# Patient Record
Sex: Female | Born: 1990 | Race: White | Hispanic: No | Marital: Married | Smoking: Current every day smoker
Health system: Southern US, Community
[De-identification: ages and names within clinical notes are randomized; demographics above are authoritative.]

## PROBLEM LIST (undated history)

## (undated) DIAGNOSIS — N83209 Unspecified ovarian cyst, unspecified side: Secondary | ICD-10-CM

## (undated) DIAGNOSIS — H539 Unspecified visual disturbance: Secondary | ICD-10-CM

## (undated) DIAGNOSIS — E039 Hypothyroidism, unspecified: Secondary | ICD-10-CM

## (undated) DIAGNOSIS — F329 Major depressive disorder, single episode, unspecified: Secondary | ICD-10-CM

## (undated) DIAGNOSIS — G35 Multiple sclerosis: Secondary | ICD-10-CM

## (undated) DIAGNOSIS — K219 Gastro-esophageal reflux disease without esophagitis: Secondary | ICD-10-CM

## (undated) DIAGNOSIS — F32A Depression, unspecified: Secondary | ICD-10-CM

## (undated) DIAGNOSIS — E079 Disorder of thyroid, unspecified: Secondary | ICD-10-CM

## (undated) DIAGNOSIS — E282 Polycystic ovarian syndrome: Secondary | ICD-10-CM

## (undated) HISTORY — DX: Unspecified visual disturbance: H53.9

---
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
## 2009-07-23 ENCOUNTER — Emergency Department (HOSPITAL_COMMUNITY): Admission: EM | Admit: 2009-07-23 | Discharge: 2009-07-23 | Payer: Self-pay | Admitting: Emergency Medicine

## 2009-07-24 ENCOUNTER — Inpatient Hospital Stay (HOSPITAL_COMMUNITY): Admission: AD | Admit: 2009-07-24 | Discharge: 2009-07-25 | Payer: Self-pay | Admitting: Obstetrics and Gynecology

## 2009-08-09 ENCOUNTER — Inpatient Hospital Stay (HOSPITAL_COMMUNITY): Admission: AD | Admit: 2009-08-09 | Discharge: 2009-08-09 | Payer: Self-pay | Admitting: Obstetrics and Gynecology

## 2009-08-13 ENCOUNTER — Ambulatory Visit: Payer: Self-pay | Admitting: Pulmonary Disease

## 2009-08-13 DIAGNOSIS — R0989 Other specified symptoms and signs involving the circulatory and respiratory systems: Secondary | ICD-10-CM

## 2009-08-13 DIAGNOSIS — R0609 Other forms of dyspnea: Secondary | ICD-10-CM | POA: Insufficient documentation

## 2009-08-13 DIAGNOSIS — E063 Autoimmune thyroiditis: Secondary | ICD-10-CM | POA: Insufficient documentation

## 2009-08-28 ENCOUNTER — Ambulatory Visit: Payer: Self-pay | Admitting: Pulmonary Disease

## 2009-10-08 ENCOUNTER — Inpatient Hospital Stay (HOSPITAL_COMMUNITY): Admission: AD | Admit: 2009-10-08 | Discharge: 2009-10-08 | Payer: Self-pay | Admitting: Obstetrics and Gynecology

## 2009-10-21 ENCOUNTER — Inpatient Hospital Stay (HOSPITAL_COMMUNITY): Admission: AD | Admit: 2009-10-21 | Discharge: 2009-10-21 | Payer: Self-pay | Admitting: Obstetrics and Gynecology

## 2009-10-21 ENCOUNTER — Ambulatory Visit: Payer: Self-pay | Admitting: Nurse Practitioner

## 2009-11-18 ENCOUNTER — Inpatient Hospital Stay (HOSPITAL_COMMUNITY): Admission: AD | Admit: 2009-11-18 | Discharge: 2009-11-18 | Payer: Self-pay | Admitting: Obstetrics and Gynecology

## 2009-11-18 ENCOUNTER — Inpatient Hospital Stay (HOSPITAL_COMMUNITY): Admission: AD | Admit: 2009-11-18 | Discharge: 2009-11-21 | Payer: Self-pay | Admitting: Obstetrics and Gynecology

## 2010-04-21 ENCOUNTER — Encounter: Payer: Self-pay | Admitting: "Endocrinology

## 2010-05-02 NOTE — Assessment & Plan Note (Signed)
Summary: rov for dyspnea   Copy to:  Jody Bovard  Primary Provider/Referring Provider:  None  CC:  Pt is here for a 2 week f/u appt.   Pt is currently [redacted] weeks pregnant.  Pt states she stopped taking Advair.   Pt states increased sob with exertion and at rest.  Pt also c/o nasal congestion and non-productive cough..  History of Present Illness: the patient comes in today for followup of her dyspnea. At the last visit, I felt it was unlikely that she had asthma, and asked her to discontinue her Advair and to followup for office spirometry. She comes in today where she is [redacted] weeks pregnant, and continues to have dyspnea with exertion and to some degree at rest. She has not had to use her rescue inhaler. She has a lot of nasal congestion and also cough secondary to postnasal drip. She denies any chest pain or lower extremity pain.  Current Medications (verified): 1)  Ventolin Hfa 108 (90 Base) Mcg/act  Aers (Albuterol Sulfate) .Marland Kitchen.. 1-2 Puffs Every 4-6 Hours As Needed 2)  Metoprolol Succinate 25 Mg Xr24h-Tab (Metoprolol Succinate) .... Take 1 Tablet By Mouth Once A Day 3)  Propylthiouracil 50 Mg Tabs (Propylthiouracil) .... Take 1 Tablet By Mouth Two Times A Day  Allergies (verified): No Known Drug Allergies  Review of Systems       The patient complains of shortness of breath with activity, shortness of breath at rest, non-productive cough, nasal congestion/difficulty breathing through nose, and sneezing.  The patient denies productive cough, coughing up blood, chest pain, irregular heartbeats, acid heartburn, indigestion, loss of appetite, weight change, abdominal pain, difficulty swallowing, sore throat, tooth/dental problems, headaches, itching, ear ache, anxiety, depression, hand/feet swelling, joint stiffness or pain, rash, change in color of mucus, and fever.    Vital Signs:  Patient profile:   20 year old female Height:      67 inches Weight:      223 pounds BMI:     35.05 O2 Sat:       96 % on Room air Temp:     97.6 degrees F oral Pulse rate:   114 / minute BP sitting:   126 / 80  (left arm) Cuff size:   regular  Vitals Entered By: Arman Filter LPN (Aug 28, 2009 10:35 AM)  O2 Flow:  Room air CC: Pt is here for a 2 week f/u appt.   Pt is currently [redacted] weeks pregnant.  Pt states she stopped taking Advair.   Pt states increased sob with exertion and at rest.  Pt also c/o nasal congestion and non-productive cough. Comments Medications reviewed with patient Arman Filter LPN  Aug 28, 2009 10:35 AM    Physical Exam  General:  pregnant female in nad Nose:  no purulence or drainage noted Lungs:  clear to auscultation, no wheezing or rhonchi Heart:  rrr Extremities:  minimal edema, no cyanosis Neurologic:  alert and oriented, moves all 4.   Impression & Recommendations:  Problem # 1:  DYSPNEA ON EXERTION (ICD-786.09)  the pt's spirometry today shows no airflow obstruction.  I suspect that her doe is related to abdominal encroachment on her diaphragm and weight gain.  Her cough really sounds more upper than anything else, and she is clearly having significant postnasal drip today.  I have asked her to discuss with her obstetrician what she is allowed to take for this.  I would like her to stay off advair, but can use albuterol  for emergencies only.  Again, I think she does not have asthma, but would like to see her back after delivery to make sure that she has returned to a normal baseline.  Other Orders: Est. Patient Level IV (16109) Spirometry w/Graph (60454)  Patient Instructions: 1)  would stay off advair, but can use albuterol for rescue if you really feel like you are having a hard time breathing. 2)  discuss with your obstetrician what you can take for postnasal drip, nasal congestion, and reflux.  3)  followup with me after your delivery.   [facility] Spirometry  ID: [id number] Patient: Taylor Donovan, Taylor Donovan DOB: [birth year] Age: 20 Years Old Sex:  Female Race: White Physician: Minette Manders Height: 67 Weight: 223 Status: Unconfirmed Past Medical History:   HASHIMOTO'S THYROIDITIS (ICD-245.2)   Recorded: 08/28/2009 11:07 AM  Parameter  Measured Predicted %Predicted FVC     3.90        4.14        94.10 FEV1     2.95        3.62        81.60 FEV1%   75.72        86.77        87.30 PEF    5.28        7.25        72.80   Interpretation: the pt has no airflow obstruction by FEV1%.  She had some coughing during testing, but had one attempt with a reasonable flow volume loop and expiratory time of greater than 6sec.

## 2010-05-02 NOTE — Assessment & Plan Note (Signed)
Summary: consult for dyspnea   Copy to:  Jody Bovard  Primary Provider/Referring Provider:  None  CC:  Pulmonary Consult.  History of Present Illness: the patient is a 20 year old female who I have been asked to see for possible asthma. She is [redacted] weeks pregnant, and one month ago began to experience difficulties taking a deep breath. She also had chest tightness, and worsening shortness of breath whenever she laid back in her bed. She also has dyspnea on exertion that she thinks is related to her pregnancy, and only minimal shortness of breath at rest. She denies a history of childhood asthma, and has only had a minimal cough that's dry in nature. She was felt to possibly have asthma, and was therefore tried on Advair. She has not seen any difference with this medication. She denies any significant lower extremity edema, and has had no pleuritic chest pain. She does have a history of thyroid disease, but tells me that her levels have been in a fairly normal range. She does not think that she has anemia currently. She has a history of smoking since age 89, but has not smoked since last month.  Preventive Screening-Counseling & Management  Alcohol-Tobacco     Smoking Status: quit  Current Medications (verified): 1)  Ventolin Hfa 108 (90 Base) Mcg/act  Aers (Albuterol Sulfate) .Marland Kitchen.. 1-2 Puffs Every 4-6 Hours As Needed 2)  Metoprolol Succinate 25 Mg Xr24h-Tab (Metoprolol Succinate) .... Take 1 Tablet By Mouth Once A Day 3)  Propylthiouracil 50 Mg Tabs (Propylthiouracil) .... Take 1 Tablet By Mouth Two Times A Day  Allergies (verified): No Known Drug Allergies  Past History:  Past Medical History:  HASHIMOTO'S THYROIDITIS (ICD-245.2)    Past Surgical History: none per pt.  Family History: Reviewed history and no changes required. cancer: mother (unsure what kind)   Social History: Reviewed history and no changes required. Patient states former smoker.   started at age 53.  1/2 ppd.   quit April 2011. pt is single. pt does not have any children.  Currently [redacted] weeks pregnant.  Due November 18, 2009 pt is single. pt lives with mother and father. pt works as a Publishing copy for [employer] Smoking Status:  quit  Review of Systems       The patient complains of shortness of breath with activity, shortness of breath at rest, non-productive cough, chest pain, irregular heartbeats, acid heartburn, headaches, sneezing, and hand/feet swelling.  The patient denies productive cough, coughing up blood, indigestion, loss of appetite, weight change, abdominal pain, difficulty swallowing, sore throat, tooth/dental problems, nasal congestion/difficulty breathing through nose, itching, ear ache, anxiety, depression, joint stiffness or pain, rash, change in color of mucus, and fever.    Vital Signs:  Patient profile:   20 year old female Height:      67 inches Weight:      221 pounds BMI:     34.74 O2 Sat:      96 % on Room air Temp:     98.0 degrees F oral Pulse rate:   99 / minute BP sitting:   118 / 68  (right arm) Cuff size:   regular  Vitals Entered By: Arman Filter LPN (Aug 13, 2009 3:03 PM)  O2 Flow:  Room air CC: Pulmonary Consult Comments Medications reviewed with patient Arman Filter LPN  Aug 13, 2009 3:03 PM    Physical Exam  General:  ow female in nad Eyes:  PERRLA and EOMI.   Nose:  patent  without discharge Mouth:  clear  Neck:  prominent thyroid no jvd, tmg, LN Lungs:  totally clear to auscultation Heart:  rrr, no mrg Abdomen:  soft, nontender, bs+ Extremities:  no edema noted, no cyanosis Neurologic:  alert and oriented, moves all 4.   Impression & Recommendations:  Problem # 1:  DYSPNEA ON EXERTION (ICD-786.09) the patient has dyspnea on exertion but I suspect is multifactorial. She is obese and [redacted] weeks pregnant, and her history is much more consistent with abdominal encroachment on her diaphragm excursion much more so than asthma. She also has  significant reflux symptoms to explain her chest tightness. She has been tried on Advair without significant change. She has no history to suggest thromboembolic disease. My only other question is whether anemia of pregnancy or possibly thyroid issues could be contributing to some of this. At this point, I have asked the patient to discontinue her Advair, and we'll see her back in a few weeks for pulmonary function testing. She can use her albuterol inhaler in the interim for rescue.  Medications Added to Medication List This Visit: 1)  Ventolin Hfa 108 (90 Base) Mcg/act Aers (Albuterol sulfate) .Marland Kitchen.. 1-2 puffs every 4-6 hours as needed 2)  Metoprolol Succinate 25 Mg Xr24h-tab (Metoprolol succinate) .... Take 1 tablet by mouth once a day 3)  Propylthiouracil 50 Mg Tabs (Propylthiouracil) .... Take 1 tablet by mouth two times a day  Other Orders: Consultation Level IV (04540)  Patient Instructions: 1)  stay off advair, but can use albuterol every 4-6 hrs if needed for emergencies. 2)  try sleeping more upright if you need to 3)  follow up with me in 2 weeks after advair has gotten out of your system, and we can check breathing tests.

## 2010-06-14 LAB — CBC
HCT: 33.8 % — ABNORMAL LOW (ref 36.0–46.0)
HCT: 39.4 % (ref 36.0–46.0)
Hemoglobin: 11.8 g/dL — ABNORMAL LOW (ref 12.0–15.0)
MCH: 29.5 pg (ref 26.0–34.0)
Platelets: 319 10*3/uL (ref 150–400)
RBC: 3.89 MIL/uL (ref 3.87–5.11)
RBC: 4.62 MIL/uL (ref 3.87–5.11)
RDW: 13.6 % (ref 11.5–15.5)

## 2010-06-14 LAB — RPR: RPR Ser Ql: NONREACTIVE

## 2010-06-16 LAB — URINALYSIS, ROUTINE W REFLEX MICROSCOPIC
Bilirubin Urine: NEGATIVE
Hgb urine dipstick: NEGATIVE
Protein, ur: NEGATIVE mg/dL
Specific Gravity, Urine: 1.015 (ref 1.005–1.030)
Urobilinogen, UA: 0.2 mg/dL (ref 0.0–1.0)

## 2010-06-18 LAB — URINALYSIS, ROUTINE W REFLEX MICROSCOPIC
Bilirubin Urine: NEGATIVE
Hgb urine dipstick: NEGATIVE
Ketones, ur: NEGATIVE mg/dL
Nitrite: NEGATIVE
Specific Gravity, Urine: <1.005 — ABNORMAL LOW (ref 1.005–1.030)

## 2010-06-18 LAB — D-DIMER, QUANTITATIVE: D-Dimer, Quant: 0.4 ug/mL-FEU (ref 0.00–0.48)

## 2011-01-03 LAB — CBC
MCHC: 35.2
MCV: 81.4
RDW: 12
WBC: 7

## 2011-01-03 LAB — PROTIME-INR: INR: 0.9

## 2011-01-03 LAB — APTT: aPTT: 29

## 2011-01-08 ENCOUNTER — Emergency Department (HOSPITAL_BASED_OUTPATIENT_CLINIC_OR_DEPARTMENT_OTHER)
Admission: EM | Admit: 2011-01-08 | Discharge: 2011-01-08 | Disposition: A | Discharge status: Home / Self Care | Payer: Self-pay | Attending: Emergency Medicine | Admitting: Emergency Medicine

## 2011-01-08 ENCOUNTER — Emergency Department (INDEPENDENT_AMBULATORY_CARE_PROVIDER_SITE_OTHER): Payer: Self-pay

## 2011-01-08 ENCOUNTER — Encounter: Payer: Self-pay | Admitting: *Deleted

## 2011-01-08 DIAGNOSIS — Z975 Presence of (intrauterine) contraceptive device: Secondary | ICD-10-CM

## 2011-01-08 DIAGNOSIS — R1032 Left lower quadrant pain: Secondary | ICD-10-CM | POA: Insufficient documentation

## 2011-01-08 DIAGNOSIS — R109 Unspecified abdominal pain: Secondary | ICD-10-CM

## 2011-01-08 DIAGNOSIS — F17200 Nicotine dependence, unspecified, uncomplicated: Secondary | ICD-10-CM | POA: Insufficient documentation

## 2011-01-08 HISTORY — DX: Disorder of thyroid, unspecified: E07.9

## 2011-01-08 LAB — COMPREHENSIVE METABOLIC PANEL
AST: 18 U/L (ref 0–37)
Albumin: 4.1 g/dL (ref 3.5–5.2)
Alkaline Phosphatase: 115 U/L (ref 39–117)
CO2: 25 mEq/L (ref 19–32)
Chloride: 102 mEq/L (ref 96–112)
Creatinine, Ser: 0.6 mg/dL (ref 0.50–1.10)
GFR calc non Af Amer: >90 mL/min (ref >90)
Potassium: 4.2 mEq/L (ref 3.5–5.1)
Total Bilirubin: 0.2 mg/dL — ABNORMAL LOW (ref 0.3–1.2)

## 2011-01-08 LAB — CBC
HCT: 38.9 % (ref 36.0–46.0)
MCHC: 35 g/dL (ref 30.0–36.0)
MCV: 80.9 fL (ref 78.0–100.0)
Platelets: 329 10*3/uL (ref 150–400)
RBC: 4.81 MIL/uL (ref 3.87–5.11)
RDW: 11.9 % (ref 11.5–15.5)

## 2011-01-08 LAB — PREGNANCY, URINE: Preg Test, Ur: NEGATIVE

## 2011-01-08 LAB — URINALYSIS, ROUTINE W REFLEX MICROSCOPIC
Ketones, ur: NEGATIVE mg/dL
Urobilinogen, UA: 0.2 mg/dL (ref 0.0–1.0)
pH: 6 (ref 5.0–8.0)

## 2011-01-08 LAB — WET PREP, GENITAL

## 2011-01-08 MED ORDER — SODIUM CHLORIDE 0.9 % IV BOLUS (SEPSIS)
1000.0000 mL | Freq: Once | INTRAVENOUS | Status: AC
  Administered 2011-01-08: 1000 mL via INTRAVENOUS

## 2011-01-08 MED ORDER — IOHEXOL 300 MG/ML  SOLN
100.0000 mL | Freq: Once | INTRAMUSCULAR | Status: AC | PRN
  Administered 2011-01-08: 100 mL via INTRAVENOUS

## 2011-01-08 MED ORDER — PROMETHAZINE HCL 25 MG PO TABS: 25.0000 mg | ORAL_TABLET | Freq: Four times a day (QID) | ORAL | Status: AC | PRN

## 2011-01-08 MED ORDER — HYDROCODONE-ACETAMINOPHEN 5-325 MG PO TABS: 1.0000 | ORAL_TABLET | ORAL | Status: AC | PRN

## 2011-01-08 MED ORDER — MORPHINE SULFATE 4 MG/ML IJ SOLN
6.0000 mg | Freq: Once | INTRAMUSCULAR | Status: AC
  Administered 2011-01-08: 6 mg via INTRAVENOUS
  Filled 2011-01-08: qty 2

## 2011-01-08 NOTE — ED Provider Notes (Signed)
History     CSN: [id number] Arrival date & time: 01/08/2011  7:44 PM  Chief Complaint  Patient presents with  . Abdominal Pain     Patient is a 20 y.o. female presenting with abdominal pain. The history is provided by the patient.  Abdominal Pain The primary symptoms of the illness include abdominal pain, nausea and vaginal discharge. The primary symptoms of the illness do not include fever, shortness of breath, vomiting, diarrhea or vaginal bleeding. Episode onset: 4 days ago. The onset of the illness was gradual. The problem has not changed since onset. The abdominal pain is located in the LLQ and RLQ. The abdominal pain does not radiate. The abdominal pain is relieved by nothing.  The patient states that she believes she is currently not pregnant. The patient has not had a change in bowel habit. Additional symptoms associated with the illness include frequency. Symptoms associated with the illness do not include chills, anorexia, diaphoresis, heartburn, constipation, urgency, hematuria or back pain.  Has been having episodic pain in lower abd since Sunday, located in a band going across her lower abdomen. It is episodic - she has approx 15 minute periods of sharp pain which is accompanied by nausea but no vomiting. No change in bowel movements or dysuria. She has been afebrile at home, and has not noted a change in appetite. She has noted a small amount of white vaginal d/c but no abnormal bleeding. Denies hx of surgeries to the abd.  She reports a history of unilateral ovarian "swelling" or a possible cyst when she was in her teens; she was started on OCPs for this condition. She currently has an IUD in place and is followed regularly by GYN. She does not have a regular period with the IUD in place but has had some spotting for the last few months around the end of the month.  Past Medical History  Diagnosis Date  . Thyroid disease     History reviewed. No pertinent past surgical  history.  No family history on file.  History  Substance Use Topics  . Smoking status: Current Everyday Smoker  . Smokeless tobacco: Not on file  . Alcohol Use: Yes    OB History    Grav Para Term Preterm Abortions TAB SAB Ect Mult Living                  Review of Systems  Constitutional: Negative for fever, chills and diaphoresis.  Respiratory: Negative for shortness of breath.   Gastrointestinal: Positive for nausea and abdominal pain. Negative for heartburn, vomiting, diarrhea, constipation and anorexia.  Genitourinary: Positive for frequency and vaginal discharge. Negative for urgency, hematuria and vaginal bleeding.  Musculoskeletal: Negative for back pain.  All other systems reviewed and are negative.    Allergies  Review of patient's allergies indicates no known allergies.  Home Medications   Current Outpatient Rx  Name Route Sig Dispense Refill  . FLUOXETINE HCL 10 MG PO CAPS Oral Take 30 mg by mouth daily.      Marland Kitchen METOPROLOL SUCCINATE 25 MG PO TB24 Oral Take 25 mg by mouth daily.      Marland Kitchen PROPYLTHIOURACIL 50 MG PO TABS Oral Take 50 mg by mouth daily.      Marland Kitchen LEVONORGESTREL 20 MCG/24HR IU IUD Intrauterine 1 each by Intrauterine route once.        BP 123/65  Pulse 98  Temp(Src) 98.6 F (37 C) (Oral)  Resp 16  Ht 5\' 7"  (1.702 m)  Wt 221 lb (100.245 kg)  BMI 34.61 kg/m2  SpO2 98%  Physical Exam  Constitutional: She is oriented to person, place, and time. She appears well-developed and well-nourished.  HENT:  Head: Normocephalic.  Eyes: EOM are normal.  Neck: Normal range of motion.  Cardiovascular: Normal rate and regular rhythm.   Pulmonary/Chest: Effort normal and breath sounds normal.  Abdominal: Soft. Bowel sounds are normal. She exhibits no distension. There is no hepatosplenomegaly. There is tenderness in the right lower quadrant and left lower quadrant. There is no rigidity, no rebound, no guarding and no CVA tenderness.       Mild to moderate TTP  across lower abd, worse in RLQ. +obturator sign.  Genitourinary: Vagina normal. Cervix exhibits no motion tenderness and no discharge. Right adnexum displays tenderness. Right adnexum displays no mass and no fullness. Left adnexum displays no mass and no fullness.       Chaperone present during exam  Musculoskeletal: Normal range of motion.  Neurological: She is alert and oriented to person, place, and time.  Skin: Skin is warm and dry.    ED Course  Procedures (including critical care time)  Labs Reviewed  URINALYSIS, ROUTINE W REFLEX MICROSCOPIC - Abnormal; Notable for the following:    Appearance CLOUDY (*)    All other components within normal limits  WET PREP, GENITAL - Abnormal; Notable for the following:    Clue Cells, Wet Prep FEW (*)    WBC, Wet Prep HPF POC RARE (*)    All other components within normal limits  CBC - Abnormal; Notable for the following:    WBC 11.7 (*)    All other components within normal limits  COMPREHENSIVE METABOLIC PANEL - Abnormal; Notable for the following:    Total Bilirubin 0.2 (*)    All other components within normal limits  PREGNANCY, URINE  GC/CHLAMYDIA PROBE AMP, GENITAL   Ct Abdomen Pelvis W Contrast  01/08/2011  *RADIOLOGY REPORT*  Clinical Data: Lower abdominal pain  CT ABDOMEN AND PELVIS WITH CONTRAST  Technique:  Multidetector CT imaging of the abdomen and pelvis was performed following the standard protocol during bolus administration of intravenous contrast.  Contrast: OMNIPAQUE IOHEXOL 300 MG/ML IV SOLN  Comparison: None  Findings: Lung bases are clear.  No pleural or pericardial fluid.  The liver is normal without focal lesions or biliary ductal dilatation.  No calcified gallstones.  The spleen is normal.  The pancreas is normal.  The adrenal glands are normal.  The kidneys are normal.  No cyst, mass, stone or hydronephrosis.  The aorta and IVC are normal.  No retroperitoneal mass or adenopathy.  No free intraperitoneal fluid or  air.  No bowel pathology.  The appendix is normal.  IUD is present within the uterus.  Adnexal regions appear unremarkable.  No free fluid in the pelvis.  No significant bony finding.  IMPRESSION: Negative CT scan of the abdomen and pelvis.  No cause of pain identified.  IUD in place.  Original Report Authenticated By: Thomasenia Sales, M.D.     No diagnosis found.    MDM  Intermittent pain mostly in the RLQ x several days, with hx of possible ovarian cyst. Pt does not appear toxic. Labwork remarkable for small white count. CT abd/pelvis does not reveal acute pathology such as cholecystitis or appendicitis. Will send patient home with analgesics, with instruction to f/u with PCP in 2 days if not improved. Discussed reasons to return to the ED sooner for re-eval.  Grant Fontana 01/08/11 2240  Grant Fontana 01/08/11 2241

## 2011-01-08 NOTE — ED Notes (Signed)
Pt returned from CT, requesting something to eat. Pt made aware that she couldn't have anything to eat until CT results were back.

## 2011-01-08 NOTE — ED Notes (Signed)
C/o lower abd pain,nausea, vaginal d/c-denies v/d,urinary s/s

## 2011-01-08 NOTE — ED Provider Notes (Signed)
Medical screening examination/treatment/procedure(s) were conducted as a shared visit with non-physician practitioner(s) and myself.  I personally evaluated the patient during the encounter  Nonspecific right-sided abdominal pain.  Her pain seems to be more tender in the right lower abdomen and therefore CT scan was obtained which demonstrates no evidence of appendicitis or other acute pathology.  My suspicion for cholecystitis very low.  Labs are normal  I personally reviewed the patient's CT scan  Results for orders placed during the hospital encounter of 01/08/11  URINALYSIS, ROUTINE W REFLEX MICROSCOPIC      Component Value Range   Color, Urine YELLOW  YELLOW    Appearance CLOUDY (*) CLEAR    Specific Gravity, Urine 1.009  1.005 - 1.030    pH 6.0  5.0 - 8.0    Glucose, UA NEGATIVE  NEGATIVE (mg/dL)   Hgb urine dipstick NEGATIVE  NEGATIVE    Bilirubin Urine NEGATIVE  NEGATIVE    Ketones, ur NEGATIVE  NEGATIVE (mg/dL)   Protein, ur NEGATIVE  NEGATIVE (mg/dL)   Urobilinogen, UA 0.2  0.0 - 1.0 (mg/dL)   Nitrite NEGATIVE  NEGATIVE    Leukocytes, UA NEGATIVE  NEGATIVE   PREGNANCY, URINE      Component Value Range   Preg Test, Ur NEGATIVE    WET PREP, GENITAL      Component Value Range   Yeast, Wet Prep NONE SEEN  NONE SEEN    Trich, Wet Prep NONE SEEN  NONE SEEN    Clue Cells, Wet Prep FEW (*) NONE SEEN    WBC, Wet Prep HPF POC RARE (*) NONE SEEN   CBC      Component Value Range   WBC 11.7 (*) 4.0 - 10.5 (K/uL)   RBC 4.81  3.87 - 5.11 (MIL/uL)   Hemoglobin 13.6  12.0 - 15.0 (g/dL)   HCT 40.9  81.1 - 91.4 (%)   MCV 80.9  78.0 - 100.0 (fL)   MCH 28.3  26.0 - 34.0 (pg)   MCHC 35.0  30.0 - 36.0 (g/dL)   RDW 78.2  95.6 - 21.3 (%)   Platelets 329  150 - 400 (K/uL)  COMPREHENSIVE METABOLIC PANEL      Component Value Range   Sodium 136  135 - 145 (mEq/L)   Potassium 4.2  3.5 - 5.1 (mEq/L)   Chloride 102  96 - 112 (mEq/L)   CO2 25  19 - 32 (mEq/L)   Glucose, Bld 96  70 - 99  (mg/dL)   BUN 8  6 - 23 (mg/dL)   Creatinine, Ser 0.86  0.50 - 1.10 (mg/dL)   Calcium 9.5  8.4 - 57.8 (mg/dL)   Total Protein 7.7  6.0 - 8.3 (g/dL)   Albumin 4.1  3.5 - 5.2 (g/dL)   AST 18  0 - 37 (U/L)   ALT 28  0 - 35 (U/L)   Alkaline Phosphatase 115  39 - 117 (U/L)   Total Bilirubin 0.2 (*) 0.3 - 1.2 (mg/dL)   GFR calc non Af Amer >90  >90 (mL/min)   GFR calc Af Amer >90  >90 (mL/min)   Ct Abdomen Pelvis W Contrast  01/08/2011  *RADIOLOGY REPORT*  Clinical Data: Lower abdominal pain  CT ABDOMEN AND PELVIS WITH CONTRAST  Technique:  Multidetector CT imaging of the abdomen and pelvis was performed following the standard protocol during bolus administration of intravenous contrast.  Contrast: OMNIPAQUE IOHEXOL 300 MG/ML IV SOLN  Comparison: None  Findings: Lung bases are clear.  No pleural or pericardial fluid.  The liver is normal without focal lesions or biliary ductal dilatation.  No calcified gallstones.  The spleen is normal.  The pancreas is normal.  The adrenal glands are normal.  The kidneys are normal.  No cyst, mass, stone or hydronephrosis.  The aorta and IVC are normal.  No retroperitoneal mass or adenopathy.  No free intraperitoneal fluid or air.  No bowel pathology.  The appendix is normal.  IUD is present within the uterus.  Adnexal regions appear unremarkable.  No free fluid in the pelvis.  No significant bony finding.  IMPRESSION: Negative CT scan of the abdomen and pelvis.  No cause of pain identified.  IUD in place.  Original Report Authenticated By: Thomasenia Sales, M.D.    Lyanne Co, MD 01/08/11 [phone]

## 2011-01-09 LAB — URINALYSIS, ROUTINE W REFLEX MICROSCOPIC
Bilirubin Urine: NEGATIVE
Glucose, UA: NEGATIVE
Ketones, ur: NEGATIVE
Nitrite: POSITIVE — AB
Specific Gravity, Urine: 1.029
pH: 6

## 2011-01-09 LAB — URINE CULTURE

## 2011-01-09 LAB — URINE MICROSCOPIC-ADD ON

## 2011-01-09 LAB — POCT PREGNANCY, URINE: Operator id: 146091

## 2011-01-09 LAB — GC/CHLAMYDIA PROBE AMP, URINE
Chlamydia, Swab/Urine, PCR: NEGATIVE
GC Probe Amp, Urine: NEGATIVE

## 2011-09-17 ENCOUNTER — Encounter (HOSPITAL_BASED_OUTPATIENT_CLINIC_OR_DEPARTMENT_OTHER): Payer: Self-pay | Admitting: *Deleted

## 2011-09-17 ENCOUNTER — Emergency Department (HOSPITAL_BASED_OUTPATIENT_CLINIC_OR_DEPARTMENT_OTHER)
Admission: EM | Admit: 2011-09-17 | Discharge: 2011-09-17 | Disposition: A | Discharge status: Home / Self Care | Payer: Medicaid Other | Attending: Emergency Medicine | Admitting: Emergency Medicine

## 2011-09-17 DIAGNOSIS — F17200 Nicotine dependence, unspecified, uncomplicated: Secondary | ICD-10-CM | POA: Insufficient documentation

## 2011-09-17 DIAGNOSIS — K219 Gastro-esophageal reflux disease without esophagitis: Secondary | ICD-10-CM | POA: Insufficient documentation

## 2011-09-17 DIAGNOSIS — E079 Disorder of thyroid, unspecified: Secondary | ICD-10-CM | POA: Insufficient documentation

## 2011-09-17 DIAGNOSIS — J029 Acute pharyngitis, unspecified: Secondary | ICD-10-CM | POA: Insufficient documentation

## 2011-09-17 HISTORY — DX: Depression, unspecified: F32.A

## 2011-09-17 HISTORY — DX: Major depressive disorder, single episode, unspecified: F32.9

## 2011-09-17 HISTORY — DX: Gastro-esophageal reflux disease without esophagitis: K21.9

## 2011-09-17 LAB — RAPID STREP SCREEN (MED CTR MEBANE ONLY): Streptococcus, Group A Screen (Direct): NEGATIVE

## 2011-09-17 MED ORDER — HYDROCODONE-ACETAMINOPHEN 5-325 MG PO TABS: 2.0000 | ORAL_TABLET | ORAL | Status: AC | PRN

## 2011-09-17 NOTE — ED Provider Notes (Signed)
History     CSN: [id number]  Arrival date & time 09/17/11  0920   First MD Initiated Contact with Patient 09/17/11 [phone]      Chief Complaint  Patient presents with  . Sore Throat    ) HPI Patient states she developed a sore throat and sinus congestion last night with no fever. Last night she took Tylenol sinus last night and this morning at 0630am. Now only had a sore throat.  Past Medical History  Diagnosis Date  . Thyroid disease   . Acid reflux   . Depression     History reviewed. No pertinent past surgical history.  No family history on file.  History  Substance Use Topics  . Smoking status: Current Everyday Smoker -- 0.5 packs/day for 8 years    Types: Cigarettes  . Smokeless tobacco: Not on file  . Alcohol Use: Yes    OB History    Grav Para Term Preterm Abortions TAB SAB Ect Mult Living                  Review of Systems  All other systems reviewed and are negative.    Allergies  Review of patient's allergies indicates no known allergies.  Home Medications   Current Outpatient Rx  Name Route Sig Dispense Refill  . FLUOXETINE HCL 10 MG PO CAPS Oral Take 30 mg by mouth daily.      Marland Kitchen HYDROCODONE-ACETAMINOPHEN 5-325 MG PO TABS Oral Take 2 tablets by mouth every 4 (four) hours as needed for pain. 10 tablet 0  . LEVONORGESTREL 20 MCG/24HR IU IUD Intrauterine 1 each by Intrauterine route once.      Marland Kitchen METOPROLOL SUCCINATE ER 25 MG PO TB24 Oral Take 25 mg by mouth daily.      Marland Kitchen PROPYLTHIOURACIL 50 MG PO TABS Oral Take 50 mg by mouth daily.        BP 152/76  Pulse 100  Temp 98.2 F (36.8 C) (Oral)  Resp 16  Ht 5\' 7"  (1.702 m)  Wt 200 lb (90.719 kg)  BMI 31.32 kg/m2  SpO2 97%  Physical Exam  Nursing note and vitals reviewed. Constitutional: She is oriented to person, place, and time. She appears well-developed and well-nourished. No distress.  HENT:  Head: Normocephalic and atraumatic.  Mouth/Throat: Uvula is midline, oropharynx is clear and  moist and mucous membranes are normal. No oropharyngeal exudate, posterior oropharyngeal edema, posterior oropharyngeal erythema or tonsillar abscesses.  Eyes: Pupils are equal, round, and reactive to light.  Neck: Normal range of motion. No mass and no thyromegaly present.  Cardiovascular: Normal rate and intact distal pulses.   Pulmonary/Chest: No respiratory distress.  Abdominal: Normal appearance. She exhibits no distension.  Musculoskeletal: Normal range of motion.  Lymphadenopathy:    She has no cervical adenopathy.  Neurological: She is alert and oriented to person, place, and time. No cranial nerve deficit.  Skin: Skin is warm and dry. No rash noted.  Psychiatric: She has a normal mood and affect. Her behavior is normal.    ED Course  Procedures (including critical care time)   Labs Reviewed  RAPID STREP SCREEN   No results found.   1. Pharyngitis       MDM         Nelia Shi, MD 09/17/11 1020

## 2011-09-17 NOTE — Discharge Instructions (Signed)
Antibiotic Nonuse  Your caregiver felt that the infection or problem was not one that would be helped with an antibiotic. Infections may be caused by viruses or bacteria. Only a caregiver can tell which one of these is the likely cause of an illness. A cold is the most common cause of infection in both adults and children. A cold is a virus. Antibiotic treatment will have no effect on a viral infection. Viruses can lead to many lost days of work caring for sick children and many missed days of school. Children may catch as many as 10 "colds" or "flus" per year during which they can be tearful, cranky, and uncomfortable. The goal of treating a virus is aimed at keeping the ill person comfortable. Antibiotics are medications used to help the body fight bacterial infections. There are relatively few types of bacteria that cause infections but there are hundreds of viruses. While both viruses and bacteria cause infection they are very different types of germs. A viral infection will typically go away by itself within 7 to 10 days. Bacterial infections may spread or get worse without antibiotic treatment. Examples of bacterial infections are:  Sore throats (like strep throat or tonsillitis).   Infection in the lung (pneumonia).   Ear and skin infections.  Examples of viral infections are:  Colds or flus.   Most coughs and bronchitis.   Sore throats not caused by Strep.   Runny noses.  It is often best not to take an antibiotic when a viral infection is the cause of the problem. Antibiotics can kill off the helpful bacteria that we have inside our body and allow harmful bacteria to start growing. Antibiotics can cause side effects such as allergies, nausea, and diarrhea without helping to improve the symptoms of the viral infection. Additionally, repeated uses of antibiotics can cause bacteria inside of our body to become resistant. That resistance can be passed onto harmful bacterial. The next time  you have an infection it may be harder to treat if antibiotics are used when they are not needed. Not treating with antibiotics allows our own immune system to develop and take care of infections more efficiently. Also, antibiotics will work better for [place] when they are prescribed for bacterial infections. Treatments for a child that is ill may include:  Give extra fluids throughout the day to stay hydrated.   Get plenty of rest.   Only give your child over-the-counter or prescription medicines for pain, discomfort, or fever as directed by your caregiver.   The use of a cool mist humidifier may help stuffy noses.   Cold medications if suggested by your caregiver.  Your caregiver may decide to start you on an antibiotic if:  The problem you were seen for today continues for a longer length of time than expected.   You develop a secondary bacterial infection.  SEEK MEDICAL CARE IF:  Fever lasts longer than 5 days.   Symptoms continue to get worse after 5 to 7 days or become severe.   Difficulty in breathing develops.   Signs of dehydration develop (poor drinking, rare urinating, dark colored urine).   Changes in behavior or worsening tiredness (listlessness or lethargy).  Document Released: 05/26/2001 Document Revised: 03/06/2011 Document Reviewed: 11/22/2008 [facility] Patient Information 2012 [place], [place].   Salt Water Gargle This solution will help make your mouth and throat feel better. HOME CARE INSTRUCTIONS  Mix 1 teaspoon of salt in 8 ounces of warm water.  Gargle with this solution as  much or often as you need or as directed. Swish and gargle gently if you have any sores or wounds in your mouth.  Do not swallow this mixture.  Document Released: 12/20/2003 Document Revised: 03/06/2011 Document Reviewed: 05/12/2008 [facility] Patient Information 2012 [place], [place]    .Pharyngitis, Viral and Bacterial Pharyngitis is soreness (inflammation) or infection of the pharynx.  It is also called a sore throat. CAUSES  Most sore throats are caused by viruses and are part of a cold. However, some sore throats are caused by strep and other bacteria. Sore throats can also be caused by post nasal drip from draining sinuses, allergies and sometimes from sleeping with an open mouth. Infectious sore throats can be spread from person to person by coughing, sneezing and sharing cups or eating utensils. TREATMENT  Sore throats that are viral usually last 3-4 days. Viral illness will get better without medications (antibiotics). Strep throat and other bacterial infections will usually begin to get better about 24-48 hours after you begin to take antibiotics. HOME CARE INSTRUCTIONS   If the caregiver feels there is a bacterial infection or if there is a positive strep test, they will prescribe an antibiotic. The full course of antibiotics must be taken. If the full course of antibiotic is not taken, you or your child may become ill again. If you or your child has strep throat and do not finish all of the medication, serious heart or kidney diseases may develop.   Drink enough water and fluids to keep your urine clear or pale yellow.   Only take over-the-counter or prescription medicines for pain, discomfort or fever as directed by your caregiver.   Get lots of rest.   Gargle with salt water ( tsp. of salt in a glass of water) as often as every 1-2 hours as you need for comfort.   Hard candies may soothe the throat if individual is not at risk for choking. Throat sprays or lozenges may also be used.  SEEK MEDICAL CARE IF:   Large, tender lumps in the neck develop.   A rash develops.   Green, yellow-brown or bloody sputum is coughed up.   Your baby is older than 3 months with a rectal temperature of 100.5 F (38.1 C) or higher for more than 1 day.  SEEK IMMEDIATE MEDICAL CARE IF:   A stiff neck develops.   You or your child are drooling or unable to swallow liquids.    You or your child are vomiting, unable to keep medications or liquids down.   You or your child has severe pain, unrelieved with recommended medications.   You or your child are having difficulty breathing (not due to stuffy nose).   You or your child are unable to fully open your mouth.   You or your child develop redness, swelling, or severe pain anywhere on the neck.   You have a fever.   Your baby is older than 3 months with a rectal temperature of 102 F (38.9 C) or higher.   Your baby is 79 months old or younger with a rectal temperature of 100.4 F (38 C) or higher.  MAKE SURE YOU:   Understand these instructions.   Will watch your condition.   Will get help right away if you are not doing well or get worse.  Document Released: 03/17/2005 Document Revised: 03/06/2011 Document Reviewed: 06/14/2007 [facility] Patient Information 2012 [place], [place].

## 2011-09-17 NOTE — ED Notes (Signed)
Patient states she developed a sore throat and sinus congestion last night with no fever.  Last night she took Tylenol sinus last night and this morning at 0630am.  Now only had a sore throat.

## 2011-11-03 ENCOUNTER — Other Ambulatory Visit (HOSPITAL_COMMUNITY): Payer: Self-pay | Admitting: Endocrinology

## 2011-11-03 DIAGNOSIS — E0590 Thyrotoxicosis, unspecified without thyrotoxic crisis or storm: Secondary | ICD-10-CM

## 2011-11-10 ENCOUNTER — Encounter (HOSPITAL_COMMUNITY)
Admission: RE | Admit: 2011-11-10 | Discharge: 2011-11-10 | Disposition: A | Discharge status: Home / Self Care | Payer: Medicaid Other | Source: Ambulatory Visit | Attending: Endocrinology | Admitting: Endocrinology

## 2011-11-10 DIAGNOSIS — E0590 Thyrotoxicosis, unspecified without thyrotoxic crisis or storm: Secondary | ICD-10-CM | POA: Insufficient documentation

## 2011-11-10 MED ORDER — SODIUM IODIDE I 131 CAPSULE: 11.5000 | Freq: Once | INTRAVENOUS | Status: AC | PRN

## 2011-11-11 ENCOUNTER — Encounter (HOSPITAL_COMMUNITY)
Admission: RE | Admit: 2011-11-11 | Discharge: 2011-11-11 | Disposition: A | Discharge status: Home / Self Care | Payer: Medicaid Other | Source: Ambulatory Visit | Attending: Endocrinology | Admitting: Endocrinology

## 2011-11-11 ENCOUNTER — Other Ambulatory Visit (HOSPITAL_COMMUNITY): Payer: Self-pay

## 2011-11-11 DIAGNOSIS — E0590 Thyrotoxicosis, unspecified without thyrotoxic crisis or storm: Secondary | ICD-10-CM | POA: Insufficient documentation

## 2011-11-11 MED ORDER — SODIUM PERTECHNETATE TC 99M INJECTION
10.0000 | Freq: Once | INTRAVENOUS | Status: AC | PRN
  Administered 2011-11-11: 10 via INTRAVENOUS

## 2011-11-13 ENCOUNTER — Encounter (HOSPITAL_COMMUNITY)
Admission: RE | Admit: 2011-11-13 | Discharge: 2011-11-13 | Disposition: A | Discharge status: Home / Self Care | Payer: Medicaid Other | Source: Ambulatory Visit | Attending: Endocrinology | Admitting: Endocrinology

## 2011-11-13 DIAGNOSIS — E0590 Thyrotoxicosis, unspecified without thyrotoxic crisis or storm: Secondary | ICD-10-CM | POA: Insufficient documentation

## 2011-11-13 LAB — HCG, SERUM, QUALITATIVE: Preg, Serum: NEGATIVE

## 2011-11-13 MED ORDER — SODIUM IODIDE I 131 CAPSULE
28.7000 | Freq: Once | INTRAVENOUS | Status: AC | PRN
  Administered 2011-11-13: 28.7 via ORAL

## 2011-11-19 DIAGNOSIS — E0500 Thyrotoxicosis with diffuse goiter without thyrotoxic crisis or storm: Secondary | ICD-10-CM | POA: Insufficient documentation

## 2011-11-19 DIAGNOSIS — Z975 Presence of (intrauterine) contraceptive device: Secondary | ICD-10-CM | POA: Insufficient documentation

## 2011-11-19 DIAGNOSIS — E063 Autoimmune thyroiditis: Secondary | ICD-10-CM | POA: Insufficient documentation

## 2012-01-12 ENCOUNTER — Emergency Department (HOSPITAL_BASED_OUTPATIENT_CLINIC_OR_DEPARTMENT_OTHER)
Admission: EM | Admit: 2012-01-12 | Discharge: 2012-01-12 | Disposition: A | Discharge status: Home / Self Care | Payer: Self-pay | Attending: Emergency Medicine | Admitting: Emergency Medicine

## 2012-01-12 ENCOUNTER — Emergency Department (HOSPITAL_BASED_OUTPATIENT_CLINIC_OR_DEPARTMENT_OTHER): Payer: Self-pay

## 2012-01-12 ENCOUNTER — Encounter (HOSPITAL_BASED_OUTPATIENT_CLINIC_OR_DEPARTMENT_OTHER): Payer: Self-pay | Admitting: *Deleted

## 2012-01-12 DIAGNOSIS — R5383 Other fatigue: Secondary | ICD-10-CM | POA: Insufficient documentation

## 2012-01-12 DIAGNOSIS — E282 Polycystic ovarian syndrome: Secondary | ICD-10-CM

## 2012-01-12 DIAGNOSIS — N949 Unspecified condition associated with female genital organs and menstrual cycle: Secondary | ICD-10-CM | POA: Insufficient documentation

## 2012-01-12 DIAGNOSIS — R102 Pelvic and perineal pain unspecified side: Secondary | ICD-10-CM

## 2012-01-12 DIAGNOSIS — R5381 Other malaise: Secondary | ICD-10-CM | POA: Insufficient documentation

## 2012-01-12 LAB — WET PREP, GENITAL

## 2012-01-12 LAB — URINALYSIS, ROUTINE W REFLEX MICROSCOPIC
Bilirubin Urine: NEGATIVE
Ketones, ur: NEGATIVE mg/dL
Leukocytes, UA: NEGATIVE
Nitrite: NEGATIVE
Protein, ur: NEGATIVE mg/dL
pH: 5.5 (ref 5.0–8.0)

## 2012-01-12 LAB — RPR: RPR Ser Ql: NONREACTIVE

## 2012-01-12 LAB — PREGNANCY, URINE: Preg Test, Ur: NEGATIVE

## 2012-01-12 LAB — URINE MICROSCOPIC-ADD ON

## 2012-01-12 MED ORDER — OXYCODONE-ACETAMINOPHEN 5-325 MG PO TABS: 1.0000 | ORAL_TABLET | ORAL | Status: DC | PRN

## 2012-01-12 NOTE — ED Provider Notes (Signed)
History     CSN: [id number]  Arrival date & time 01/12/12  1008   First MD Initiated Contact with Patient 01/12/12 1059      Chief Complaint  Patient presents with  . Pelvic Pain    (Consider location/radiation/quality/duration/timing/severity/associated sxs/prior treatment) Patient is a 21 y.o. female presenting with pelvic pain. The history is provided by the patient.  Pelvic Pain  She has had the Mirena IUD in place for 2 years and generally does not have menstrual periods. 2 weeks ago, she had a well for her have been normal menses which lasted for 7 days. She now feels like she is premenstrual. She's noted some breast tenderness and urinary frequency. She is concerned that the IUD may be out of place and she states that it generally needs an ultrasound to determine that. There's been slight vaginal discharge. Pain is crampy and 4 at over 10 at its worst. She denies dyspareunia. She denies nausea or vomiting.  Past Medical History  Diagnosis Date  . Thyroid disease   . Acid reflux   . Depression     History reviewed. No pertinent past surgical history.  History reviewed. No pertinent family history.  History  Substance Use Topics  . Smoking status: Current Every Day Smoker -- 0.5 packs/day for 8 years    Types: Cigarettes  . Smokeless tobacco: Not on file  . Alcohol Use: Yes    OB History    Grav Para Term Preterm Abortions TAB SAB Ect Mult Living                  Review of Systems  Genitourinary: Positive for pelvic pain.  All other systems reviewed and are negative.    Allergies  Review of patient's allergies indicates no known allergies.  Home Medications   Current Outpatient Rx  Name Route Sig Dispense Refill  . FLUOXETINE HCL 10 MG PO CAPS Oral Take 30 mg by mouth daily.      Marland Kitchen LEVONORGESTREL 20 MCG/24HR IU IUD Intrauterine 1 each by Intrauterine route once.      Marland Kitchen METOPROLOL SUCCINATE ER 25 MG PO TB24 Oral Take 25 mg by mouth daily.      Marland Kitchen  PROPYLTHIOURACIL 50 MG PO TABS Oral Take 50 mg by mouth daily.        BP 138/93  Pulse 110  Temp 98.4 F (36.9 C) (Oral)  Resp 18  Ht 5\' 7"  (1.702 m)  Wt 230 lb (104.327 kg)  BMI 36.02 kg/m2  SpO2 98%  LMP 12/12/2011  Physical Exam  Nursing note and vitals reviewed. 21year old female, resting comfortably and in no acute distress. Vital signs are significant for tachycardia with heart rate of 110. Oxygen saturation is 98%, which is normal. Head is normocephalic and atraumatic. PERRLA, EOMI. Oropharynx is clear. Neck is nontender and supple without adenopathy or JVD. Back is nontender and there is no CVA tenderness. Lungs are clear without rales, wheezes, or rhonchi. Chest is nontender. Heart has regular rate and rhythm without murmur. Abdomen is soft, flat,without masses or hepatosplenomegaly and peristalsis is normoactive. There is mild suprapubic tenderness. Pelvic: Normal external female genitalia, mild to moderate white vaginal discharge present, cervix closed without evidence of bleeding, fundus normal size and position without tenderness and no cervical motion tenderness, no adnexal masses or tenderness. Extremities have no cyanosis or edema, full range of motion is present. Skin is warm and dry without rash. Neurologic: Mental status is normal, cranial nerves are intact, there  are no motor or sensory deficits.   ED Course  Procedures (including critical care time)  Results for orders placed during the hospital encounter of 01/12/12  PREGNANCY, URINE      Component Value Range   Preg Test, Ur NEGATIVE  NEGATIVE  URINALYSIS, ROUTINE W REFLEX MICROSCOPIC      Component Value Range   Color, Urine YELLOW  YELLOW   APPearance CLOUDY (*) CLEAR   Specific Gravity, Urine 1.023  1.005 - 1.030   pH 5.5  5.0 - 8.0   Glucose, UA NEGATIVE  NEGATIVE mg/dL   Hgb urine dipstick TRACE (*) NEGATIVE   Bilirubin Urine NEGATIVE  NEGATIVE   Ketones, ur NEGATIVE  NEGATIVE mg/dL    Protein, ur NEGATIVE  NEGATIVE mg/dL   Urobilinogen, UA 0.2  0.0 - 1.0 mg/dL   Nitrite NEGATIVE  NEGATIVE   Leukocytes, UA NEGATIVE  NEGATIVE  URINE MICROSCOPIC-ADD ON      Component Value Range   Squamous Epithelial / LPF FEW (*) RARE   WBC, UA 0-2  <3 WBC/hpf   RBC / HPF 0-2  <3 RBC/hpf   Bacteria, UA MANY (*) RARE   Urine-Other MUCOUS PRESENT    WET PREP, GENITAL      Component Value Range   Yeast Wet Prep HPF POC FEW (*) NONE SEEN   Trich, Wet Prep NONE SEEN  NONE SEEN   Clue Cells Wet Prep HPF POC NONE SEEN  NONE SEEN   WBC, Wet Prep HPF POC RARE (*) NONE SEEN   US Transvaginal Non-ob  01/12/2012  *RADIOLOGY REPORT*  Clinical Data: Pelvic pain and fatigue. IUD in place for 2 years. LMP 12/13/2011  TRANSABDOMINAL AND TRANSVAGINAL ULTRASOUND OF PELVIS Technique:  Both transabdominal and transvaginal ultrasound examinations of the pelvis were performed. Transabdominal technique was performed for global imaging of the pelvis including uterus, ovaries, adnexal regions, and pelvic cul-de-sac.  It was necessary to proceed with endovaginal exam following the transabdominal exam to visualize the myometrium, endometrium and adnexa.  Comparison:  Findings:  Uterus: Is anteverted and anteflexed and demonstrates a sagittal length of 8.4 cm, AP depth of 4.1 cm and width of 5.6 cm a homogeneous uterine myometrium is seen.  Endometrium: The IUD is visualized within the endometrial canal with 2-D imaging and appears appropriately positioned by this technique.  The lining appears homogeneously echogenic with a width of 8 mm.  No areas of focal thickening or heterogeneity are seen in the visualized portions of the canal  Right ovary:  Measures 5.5 x 2.5 by 2.9 cm (20.7 cc)and contains multiple sub centimeter follicles (>12)  Left ovary: Measures 3.9 x 2.9 x 2.6 cm (15.3 cc)and contains multiple sub centimeter follicles (>12)  Other findings: A small amount of simple free fluid is noted in the cul-de-sac.   IMPRESSION: Appropriate positioning of the IUD suggested with 2-D imaging.  Normal myometrium.  Prominent ovarian size bilaterally with multiple sub centimeter follicles meeting the criteria for polycystic ovaries sonographically (>10cc and >12 subcentimeter follicles) .   Original Report Authenticated By: Bertha Stakes, M.D.    US Pelvis Complete  01/12/2012  *RADIOLOGY REPORT*  Clinical Data: Pelvic pain and fatigue. IUD in place for 2 years. LMP 12/13/2011  TRANSABDOMINAL AND TRANSVAGINAL ULTRASOUND OF PELVIS Technique:  Both transabdominal and transvaginal ultrasound examinations of the pelvis were performed. Transabdominal technique was performed for global imaging of the pelvis including uterus, ovaries, adnexal regions, and pelvic cul-de-sac.  It was necessary to proceed with  endovaginal exam following the transabdominal exam to visualize the myometrium, endometrium and adnexa.  Comparison:  Findings:  Uterus: Is anteverted and anteflexed and demonstrates a sagittal length of 8.4 cm, AP depth of 4.1 cm and width of 5.6 cm a homogeneous uterine myometrium is seen.  Endometrium: The IUD is visualized within the endometrial canal with 2-D imaging and appears appropriately positioned by this technique.  The lining appears homogeneously echogenic with a width of 8 mm.  No areas of focal thickening or heterogeneity are seen in the visualized portions of the canal  Right ovary:  Measures 5.5 x 2.5 by 2.9 cm (20.7 cc)and contains multiple sub centimeter follicles (>12)  Left ovary: Measures 3.9 x 2.9 x 2.6 cm (15.3 cc)and contains multiple sub centimeter follicles (>12)  Other findings: A small amount of simple free fluid is noted in the cul-de-sac.  IMPRESSION: Appropriate positioning of the IUD suggested with 2-D imaging.  Normal myometrium.  Prominent ovarian size bilaterally with multiple sub centimeter follicles meeting the criteria for polycystic ovaries sonographically (>10cc and >12 subcentimeter  follicles) .   Original Report Authenticated By: Bertha Stakes, M.D.       1. Pelvic pain in female   2. Polycystic ovaries       MDM  Pelvic pain which may be related to her IUD. She will be sent for ultrasound to confirm IUD placement.  Ultrasound shows IUD in appropriate position. Polycystic ovaries are noted as an incidental finding. Patient is advised to followup with her OB/GYN to see if further hormonal manipulation would be indicated. Prescription is given for Percocet for pain. She's advised to use OTC NSAIDs as needed.      Dione Booze, MD 01/12/12 [phone]

## 2012-01-12 NOTE — ED Notes (Signed)
Pt amb to room 10 with quick steady gait smiling in nad. Pt reports having her iud in place x 2 years. Pt states in September she has a longer and heavier period in September, now her breasts are tender, pelvic pain and her usual pms symptoms, pt states she does not usually have periods with her iud and wonders if it is out of place.

## 2012-01-13 LAB — GC/CHLAMYDIA PROBE AMP, GENITAL
Chlamydia, DNA Probe: NEGATIVE
GC Probe Amp, Genital: NEGATIVE

## 2012-01-15 DIAGNOSIS — E282 Polycystic ovarian syndrome: Secondary | ICD-10-CM | POA: Insufficient documentation

## 2012-01-22 ENCOUNTER — Emergency Department (HOSPITAL_BASED_OUTPATIENT_CLINIC_OR_DEPARTMENT_OTHER): Payer: Self-pay

## 2012-01-22 ENCOUNTER — Encounter (HOSPITAL_BASED_OUTPATIENT_CLINIC_OR_DEPARTMENT_OTHER): Payer: Self-pay | Admitting: *Deleted

## 2012-01-22 ENCOUNTER — Emergency Department (HOSPITAL_BASED_OUTPATIENT_CLINIC_OR_DEPARTMENT_OTHER)
Admission: EM | Admit: 2012-01-22 | Discharge: 2012-01-22 | Disposition: A | Discharge status: Home / Self Care | Payer: Self-pay | Attending: Emergency Medicine | Admitting: Emergency Medicine

## 2012-01-22 DIAGNOSIS — R109 Unspecified abdominal pain: Secondary | ICD-10-CM

## 2012-01-22 DIAGNOSIS — Z8719 Personal history of other diseases of the digestive system: Secondary | ICD-10-CM | POA: Insufficient documentation

## 2012-01-22 DIAGNOSIS — F17200 Nicotine dependence, unspecified, uncomplicated: Secondary | ICD-10-CM | POA: Insufficient documentation

## 2012-01-22 DIAGNOSIS — F3289 Other specified depressive episodes: Secondary | ICD-10-CM | POA: Insufficient documentation

## 2012-01-22 DIAGNOSIS — E079 Disorder of thyroid, unspecified: Secondary | ICD-10-CM | POA: Insufficient documentation

## 2012-01-22 DIAGNOSIS — F329 Major depressive disorder, single episode, unspecified: Secondary | ICD-10-CM | POA: Insufficient documentation

## 2012-01-22 DIAGNOSIS — Z79899 Other long term (current) drug therapy: Secondary | ICD-10-CM | POA: Insufficient documentation

## 2012-01-22 DIAGNOSIS — E282 Polycystic ovarian syndrome: Secondary | ICD-10-CM | POA: Insufficient documentation

## 2012-01-22 LAB — URINALYSIS, ROUTINE W REFLEX MICROSCOPIC
Bilirubin Urine: NEGATIVE
Ketones, ur: NEGATIVE mg/dL
Specific Gravity, Urine: 1.021 (ref 1.005–1.030)
Urobilinogen, UA: 0.2 mg/dL (ref 0.0–1.0)

## 2012-01-22 LAB — URINE MICROSCOPIC-ADD ON

## 2012-01-22 LAB — CBC WITH DIFFERENTIAL/PLATELET
Basophils Absolute: 0 10*3/uL (ref 0.0–0.1)
HCT: 39.4 % (ref 36.0–46.0)
Lymphocytes Relative: 26 % (ref 12–46)
Neutro Abs: 6.5 10*3/uL (ref 1.7–7.7)
Platelets: 316 10*3/uL (ref 150–400)
RDW: 12.8 % (ref 11.5–15.5)
WBC: 9.6 10*3/uL (ref 4.0–10.5)

## 2012-01-22 LAB — BASIC METABOLIC PANEL
CO2: 24 mEq/L (ref 19–32)
Chloride: 103 mEq/L (ref 96–112)
Sodium: 139 mEq/L (ref 135–145)

## 2012-01-22 MED ORDER — SODIUM CHLORIDE 0.9 % IV SOLN: INTRAVENOUS | Status: DC

## 2012-01-22 MED ORDER — ONDANSETRON HCL 4 MG/2ML IJ SOLN
4.0000 mg | Freq: Once | INTRAMUSCULAR | Status: AC
  Administered 2012-01-22: 4 mg via INTRAVENOUS
  Filled 2012-01-22: qty 2

## 2012-01-22 MED ORDER — IOHEXOL 300 MG/ML  SOLN
100.0000 mL | Freq: Once | INTRAMUSCULAR | Status: AC | PRN
  Administered 2012-01-22: 100 mL via INTRAVENOUS

## 2012-01-22 MED ORDER — SODIUM CHLORIDE 0.9 % IV BOLUS (SEPSIS)
250.0000 mL | Freq: Once | INTRAVENOUS | Status: AC
  Administered 2012-01-22: 250 mL via INTRAVENOUS

## 2012-01-22 MED ORDER — NAPROXEN 500 MG PO TABS: 500.0000 mg | ORAL_TABLET | Freq: Two times a day (BID) | ORAL | Status: DC

## 2012-01-22 MED ORDER — HYDROMORPHONE HCL PF 1 MG/ML IJ SOLN
1.0000 mg | Freq: Once | INTRAMUSCULAR | Status: AC
  Administered 2012-01-22: 1 mg via INTRAVENOUS
  Filled 2012-01-22: qty 1

## 2012-01-22 MED ORDER — IOHEXOL 300 MG/ML  SOLN
25.0000 mL | INTRAMUSCULAR | Status: DC
  Administered 2012-01-22: 25 mL via ORAL

## 2012-01-22 NOTE — ED Provider Notes (Addendum)
History     CSN: [id number]  Arrival date & time 01/22/12  1617   First MD Initiated Contact with Patient 01/22/12 1639      Chief Complaint  Patient presents with  . Abdominal Pain    (Consider location/radiation/quality/duration/timing/severity/associated sxs/prior treatment) Patient is a 21 y.o. female presenting with abdominal pain. The history is provided by the patient.  Abdominal Pain The primary symptoms of the illness include abdominal pain. The primary symptoms of the illness do not include fever, shortness of breath, nausea, vomiting, diarrhea or vaginal discharge.  Symptoms associated with the illness do not include hematuria or back pain.   in the onset of right lower quadrant abdominal pain at 3:30 this afternoon. Patient was seen in the emergency department on October 14 for pelvic pain with ultrasound that time that showed polycystic ovary disease IUD in the right place and she was referred to OB/GYN. Patient did not follow up with OB/GYN has been doing better until this afternoon. No nausea no vomiting no diarrhea no fever no back pain no pelvic complaints. Pain is described as a 10 out of 10 sharp in nature and it is constant  Past Medical History  Diagnosis Date  . Thyroid disease   . Acid reflux   . Depression     History reviewed. No pertinent past surgical history.  No family history on file.  History  Substance Use Topics  . Smoking status: Current Every Day Smoker -- 0.5 packs/day for 8 years    Types: Cigarettes  . Smokeless tobacco: Not on file  . Alcohol Use: Yes    OB History    Grav Para Term Preterm Abortions TAB SAB Ect Mult Living                  Review of Systems  Constitutional: Negative for fever.  HENT: Negative for congestion and neck pain.   Eyes: Negative for redness.  Respiratory: Negative for shortness of breath.   Cardiovascular: Negative for chest pain.  Gastrointestinal: Positive for abdominal pain. Negative for nausea,  vomiting and diarrhea.  Genitourinary: Negative for hematuria and vaginal discharge.  Musculoskeletal: Negative for back pain.  Skin: Negative for rash.  Neurological: Negative for headaches.  Hematological: Does not bruise/bleed easily.    Allergies  Review of patient's allergies indicates no known allergies.  Home Medications   Current Outpatient Rx  Name Route Sig Dispense Refill  . FLUOXETINE HCL 10 MG PO CAPS Oral Take 30 mg by mouth daily.      Marland Kitchen LEVONORGESTREL 20 MCG/24HR IU IUD Intrauterine 1 each by Intrauterine route once.      Marland Kitchen METOPROLOL SUCCINATE ER 25 MG PO TB24 Oral Take 25 mg by mouth daily.      Marland Kitchen NAPROXEN 500 MG PO TABS Oral Take 1 tablet (500 mg total) by mouth 2 (two) times daily. 14 tablet 0  . OXYCODONE-ACETAMINOPHEN 5-325 MG PO TABS Oral Take 1 tablet by mouth every 4 (four) hours as needed for pain. 30 tablet 0  . PROPYLTHIOURACIL 50 MG PO TABS Oral Take 50 mg by mouth daily.        BP 138/74  Pulse 90  Temp 99.5 F (37.5 C) (Oral)  Resp 18  Ht 5\' 7"  (1.702 m)  Wt 230 lb (104.327 kg)  BMI 36.02 kg/m2  SpO2 99%  LMP 12/12/2011  Physical Exam  Nursing note and vitals reviewed. Constitutional: She is oriented to person, place, and time. She appears well-developed and well-nourished.  No distress.  HENT:  Head: Normocephalic and atraumatic.  Eyes: Conjunctivae normal and EOM are normal. Pupils are equal, round, and reactive to light.  Neck: Normal range of motion. Neck supple.  Cardiovascular: Normal rate, regular rhythm and normal heart sounds.   No murmur heard. Pulmonary/Chest: Effort normal. No respiratory distress.  Abdominal: Soft. Bowel sounds are normal. There is tenderness. There is no rebound and no guarding.       Right lower corner tenderness without guarding.  Musculoskeletal: Normal range of motion. She exhibits no edema.  Neurological: She is alert and oriented to person, place, and time. No cranial nerve deficit. She exhibits normal  muscle tone. Coordination normal.  Skin: Skin is warm. No rash noted. No erythema.    ED Course  Procedures (including critical care time)  Labs Reviewed  URINALYSIS, ROUTINE W REFLEX MICROSCOPIC - Abnormal; Notable for the following:    Hgb urine dipstick MODERATE (*)     Leukocytes, UA SMALL (*)     All other components within normal limits  BASIC METABOLIC PANEL - Abnormal; Notable for the following:    Glucose, Bld 128 (*)     All other components within normal limits  PREGNANCY, URINE  URINE MICROSCOPIC-ADD ON  CBC WITH DIFFERENTIAL   Ct Abdomen Pelvis W Contrast  01/22/2012  *RADIOLOGY REPORT*  Clinical Data: Abdominal pain.  Diarrhea.  CT ABDOMEN AND PELVIS WITH CONTRAST  Technique:  Multidetector CT imaging of the abdomen and pelvis was performed following the standard protocol during bolus administration of intravenous contrast.  Contrast: 1 OMNIPAQUE IOHEXOL 300 MG/ML  SOLN, OMNIPAQUE IOHEXOL 300 MG/ML  SOLN  Comparison: Pelvic ultrasound 01/12/2012.  Findings: The lung bases are clear without focal nodule, mass, or airspace disease.  The heart size is normal.  No significant pleural or pericardial effusion is present.  The liver and spleen are within normal limits.  The stomach is mildly distended, but within normal limits.  The duodenum and pancreas are unremarkable.  The common bile duct and gallbladder are normal.  The adrenal glands are normal bilaterally.  The kidneys and ureters are unremarkable.  The urinary bladder is within normal limits.  The rectosigmoid colon is within normal limits.  The remainder of the colon is unremarkable.  The appendix is visualized and normal. The small bowel is within normal limits.  The IUD is again noted to be positioned 105 degrees from expected. The cross arms are within the endometrial canal with the base directed towards the right cornua.  The bone windows demonstrate slight degenerative anterolisthesis at L5-S1.  No focal lytic or  blastic lesions are present.  IMPRESSION:  1.  No acute or focal abnormality to explain the patient's symptoms. 2.  Stable abnormal position of the IUD.   Original Report Authenticated By: Jamesetta Orleans. MATTERN, M.D.    Results for orders placed during the hospital encounter of 01/22/12  URINALYSIS, ROUTINE W REFLEX MICROSCOPIC      Component Value Range   Color, Urine YELLOW  YELLOW   APPearance CLEAR  CLEAR   Specific Gravity, Urine 1.021  1.005 - 1.030   pH 6.5  5.0 - 8.0   Glucose, UA NEGATIVE  NEGATIVE mg/dL   Hgb urine dipstick MODERATE (*) NEGATIVE   Bilirubin Urine NEGATIVE  NEGATIVE   Ketones, ur NEGATIVE  NEGATIVE mg/dL   Protein, ur NEGATIVE  NEGATIVE mg/dL   Urobilinogen, UA 0.2  0.0 - 1.0 mg/dL   Nitrite NEGATIVE  NEGATIVE  Leukocytes, UA SMALL (*) NEGATIVE  PREGNANCY, URINE      Component Value Range   Preg Test, Ur NEGATIVE  NEGATIVE  URINE MICROSCOPIC-ADD ON      Component Value Range   Squamous Epithelial / LPF RARE  RARE   WBC, UA 3-6  <3 WBC/hpf   RBC / HPF 3-6  <3 RBC/hpf   Bacteria, UA RARE  RARE  CBC WITH DIFFERENTIAL      Component Value Range   WBC 9.6  4.0 - 10.5 K/uL   RBC 4.81  3.87 - 5.11 MIL/uL   Hemoglobin 13.8  12.0 - 15.0 g/dL   HCT 40.9  81.1 - 91.4 %   MCV 81.9  78.0 - 100.0 fL   MCH 28.7  26.0 - 34.0 pg   MCHC 35.0  30.0 - 36.0 g/dL   RDW 78.2  95.6 - 21.3 %   Platelets 316  150 - 400 K/uL   Neutrophils Relative 67  43 - 77 %   Neutro Abs 6.5  1.7 - 7.7 K/uL   Lymphocytes Relative 26  12 - 46 %   Lymphs Abs 2.5  0.7 - 4.0 K/uL   Monocytes Relative 5  3 - 12 %   Monocytes Absolute 0.5  0.1 - 1.0 K/uL   Eosinophils Relative 1  0 - 5 %   Eosinophils Absolute 0.1  0.0 - 0.7 K/uL   Basophils Relative 0  0 - 1 %   Basophils Absolute 0.0  0.0 - 0.1 K/uL  BASIC METABOLIC PANEL      Component Value Range   Sodium 139  135 - 145 mEq/L   Potassium 3.7  3.5 - 5.1 mEq/L   Chloride 103  96 - 112 mEq/L   CO2 24  19 - 32 mEq/L   Glucose, Bld  128 (*) 70 - 99 mg/dL   BUN 9  6 - 23 mg/dL   Creatinine, Ser 0.86  0.50 - 1.10 mg/dL   Calcium 9.1  8.4 - 57.8 mg/dL   GFR calc non Af Amer >90  >90 mL/min   GFR calc Af Amer >90  >90 mL/min      1. Abdominal pain       MDM   CT scan without evidence of appendicitis or any other acute abdominal findings. Patient is known to have polycystic ovary disease we'll treat with anti-inflammatory medicine for the next 7 days. Have patient followup with her record Dr.        Shelda Jakes, MD 01/22/12 [id number]  Shelda Jakes, MD 01/23/12 1332

## 2012-01-22 NOTE — ED Notes (Signed)
Abdominal pain right mid abdomen into her back x 1 hour. States she has a hx of POS.

## 2012-07-27 ENCOUNTER — Emergency Department (HOSPITAL_COMMUNITY)
Admission: EM | Admit: 2012-07-27 | Discharge: 2012-07-27 | Disposition: A | Discharge status: Home / Self Care | Payer: No Typology Code available for payment source | Attending: Emergency Medicine | Admitting: Emergency Medicine

## 2012-07-27 ENCOUNTER — Encounter (HOSPITAL_COMMUNITY): Payer: Self-pay | Admitting: Emergency Medicine

## 2012-07-27 ENCOUNTER — Emergency Department (HOSPITAL_COMMUNITY): Payer: No Typology Code available for payment source

## 2012-07-27 DIAGNOSIS — Z8659 Personal history of other mental and behavioral disorders: Secondary | ICD-10-CM | POA: Insufficient documentation

## 2012-07-27 DIAGNOSIS — Y92410 Unspecified street and highway as the place of occurrence of the external cause: Secondary | ICD-10-CM | POA: Insufficient documentation

## 2012-07-27 DIAGNOSIS — Z8639 Personal history of other endocrine, nutritional and metabolic disease: Secondary | ICD-10-CM | POA: Insufficient documentation

## 2012-07-27 DIAGNOSIS — Z975 Presence of (intrauterine) contraceptive device: Secondary | ICD-10-CM | POA: Insufficient documentation

## 2012-07-27 DIAGNOSIS — IMO0002 Reserved for concepts with insufficient information to code with codable children: Secondary | ICD-10-CM | POA: Insufficient documentation

## 2012-07-27 DIAGNOSIS — S0993XA Unspecified injury of face, initial encounter: Secondary | ICD-10-CM | POA: Insufficient documentation

## 2012-07-27 DIAGNOSIS — S6990XA Unspecified injury of unspecified wrist, hand and finger(s), initial encounter: Secondary | ICD-10-CM | POA: Insufficient documentation

## 2012-07-27 DIAGNOSIS — Y9389 Activity, other specified: Secondary | ICD-10-CM | POA: Insufficient documentation

## 2012-07-27 DIAGNOSIS — Z79899 Other long term (current) drug therapy: Secondary | ICD-10-CM | POA: Insufficient documentation

## 2012-07-27 DIAGNOSIS — Z862 Personal history of diseases of the blood and blood-forming organs and certain disorders involving the immune mechanism: Secondary | ICD-10-CM | POA: Insufficient documentation

## 2012-07-27 DIAGNOSIS — F17200 Nicotine dependence, unspecified, uncomplicated: Secondary | ICD-10-CM | POA: Insufficient documentation

## 2012-07-27 DIAGNOSIS — S59909A Unspecified injury of unspecified elbow, initial encounter: Secondary | ICD-10-CM | POA: Insufficient documentation

## 2012-07-27 DIAGNOSIS — S199XXA Unspecified injury of neck, initial encounter: Secondary | ICD-10-CM | POA: Insufficient documentation

## 2012-07-27 DIAGNOSIS — Z8719 Personal history of other diseases of the digestive system: Secondary | ICD-10-CM | POA: Insufficient documentation

## 2012-07-27 MED ORDER — HYDROCODONE-ACETAMINOPHEN 5-325 MG PO TABS: ORAL_TABLET | ORAL | Status: DC

## 2012-07-27 MED ORDER — DIAZEPAM 5 MG PO TABS: ORAL_TABLET | ORAL | Status: DC
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
## 2012-07-27 NOTE — ED Provider Notes (Signed)
History/physical exam/procedure(s) were performed by non-physician practitioner and as supervising physician I was immediately available for consultation/collaboration. I have reviewed all notes and am in agreement with care and plan.   Theia Dezeeuw S Ramona Ruark, MD 07/27/12 1643 

## 2012-07-27 NOTE — ED Notes (Signed)
Patient transported to X-ray 

## 2012-07-27 NOTE — ED Notes (Signed)
Pt ambulated to the BR with steady gait.  No assistance needed.
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
## 2013-08-20 ENCOUNTER — Emergency Department (HOSPITAL_BASED_OUTPATIENT_CLINIC_OR_DEPARTMENT_OTHER): Payer: Self-pay

## 2013-08-20 ENCOUNTER — Emergency Department (HOSPITAL_BASED_OUTPATIENT_CLINIC_OR_DEPARTMENT_OTHER)
Admission: EM | Admit: 2013-08-20 | Discharge: 2013-08-20 | Disposition: A | Discharge status: Home / Self Care | Payer: Self-pay | Attending: Emergency Medicine | Admitting: Emergency Medicine

## 2013-08-20 ENCOUNTER — Encounter (HOSPITAL_BASED_OUTPATIENT_CLINIC_OR_DEPARTMENT_OTHER): Payer: Self-pay | Admitting: Emergency Medicine

## 2013-08-20 ENCOUNTER — Emergency Department (HOSPITAL_BASED_OUTPATIENT_CLINIC_OR_DEPARTMENT_OTHER): Payer: Medicaid Other

## 2013-08-20 DIAGNOSIS — R1031 Right lower quadrant pain: Secondary | ICD-10-CM | POA: Insufficient documentation

## 2013-08-20 DIAGNOSIS — Z79899 Other long term (current) drug therapy: Secondary | ICD-10-CM | POA: Insufficient documentation

## 2013-08-20 DIAGNOSIS — R1011 Right upper quadrant pain: Secondary | ICD-10-CM | POA: Insufficient documentation

## 2013-08-20 DIAGNOSIS — F17200 Nicotine dependence, unspecified, uncomplicated: Secondary | ICD-10-CM | POA: Insufficient documentation

## 2013-08-20 DIAGNOSIS — F3289 Other specified depressive episodes: Secondary | ICD-10-CM | POA: Insufficient documentation

## 2013-08-20 DIAGNOSIS — Z8719 Personal history of other diseases of the digestive system: Secondary | ICD-10-CM | POA: Insufficient documentation

## 2013-08-20 DIAGNOSIS — R109 Unspecified abdominal pain: Secondary | ICD-10-CM

## 2013-08-20 DIAGNOSIS — Z3202 Encounter for pregnancy test, result negative: Secondary | ICD-10-CM | POA: Insufficient documentation

## 2013-08-20 DIAGNOSIS — R110 Nausea: Secondary | ICD-10-CM | POA: Insufficient documentation

## 2013-08-20 DIAGNOSIS — F329 Major depressive disorder, single episode, unspecified: Secondary | ICD-10-CM | POA: Insufficient documentation

## 2013-08-20 DIAGNOSIS — E079 Disorder of thyroid, unspecified: Secondary | ICD-10-CM | POA: Insufficient documentation

## 2013-08-20 DIAGNOSIS — R197 Diarrhea, unspecified: Secondary | ICD-10-CM | POA: Insufficient documentation

## 2013-08-20 LAB — CBC WITH DIFFERENTIAL/PLATELET
BASOS ABS: 0 10*3/uL (ref 0.0–0.1)
Basophils Relative: 0 % (ref 0–1)
EOS ABS: 0.1 10*3/uL (ref 0.0–0.7)
EOS PCT: 1 % (ref 0–5)
HEMATOCRIT: 39.2 % (ref 36.0–46.0)
Hemoglobin: 13.7 g/dL (ref 12.0–15.0)
LYMPHS PCT: 25 % (ref 12–46)
Lymphs Abs: 2.3 10*3/uL (ref 0.7–4.0)
MCH: 31.4 pg (ref 26.0–34.0)
MCHC: 34.9 g/dL (ref 30.0–36.0)
MCV: 89.9 fL (ref 78.0–100.0)
MONO ABS: 0.6 10*3/uL (ref 0.1–1.0)
Monocytes Relative: 6 % (ref 3–12)
Neutro Abs: 6.3 10*3/uL (ref 1.7–7.7)
Neutrophils Relative %: 68 % (ref 43–77)
PLATELETS: 267 10*3/uL (ref 150–400)
RBC: 4.36 MIL/uL (ref 3.87–5.11)
RDW: 11.6 % (ref 11.5–15.5)
WBC: 9.3 10*3/uL (ref 4.0–10.5)

## 2013-08-20 LAB — PREGNANCY, URINE: Preg Test, Ur: NEGATIVE

## 2013-08-20 LAB — URINALYSIS, ROUTINE W REFLEX MICROSCOPIC
BILIRUBIN URINE: NEGATIVE
Glucose, UA: NEGATIVE mg/dL
HGB URINE DIPSTICK: NEGATIVE
KETONES UR: NEGATIVE mg/dL
Leukocytes, UA: NEGATIVE
NITRITE: NEGATIVE
PROTEIN: NEGATIVE mg/dL
SPECIFIC GRAVITY, URINE: 1.012 (ref 1.005–1.030)
UROBILINOGEN UA: 0.2 mg/dL (ref 0.0–1.0)
pH: 7 (ref 5.0–8.0)

## 2013-08-20 LAB — LIPASE, BLOOD: Lipase: 21 U/L (ref 11–59)

## 2013-08-20 LAB — COMPREHENSIVE METABOLIC PANEL
ALT: 22 U/L (ref 0–35)
AST: 17 U/L (ref 0–37)
Albumin: 4.3 g/dL (ref 3.5–5.2)
Alkaline Phosphatase: 72 U/L (ref 39–117)
BUN: 5 mg/dL — ABNORMAL LOW (ref 6–23)
CALCIUM: 9.3 mg/dL (ref 8.4–10.5)
CO2: 25 meq/L (ref 19–32)
CREATININE: 0.7 mg/dL (ref 0.50–1.10)
Chloride: 104 mEq/L (ref 96–112)
GLUCOSE: 97 mg/dL (ref 70–99)
Potassium: 3.9 mEq/L (ref 3.7–5.3)
SODIUM: 141 meq/L (ref 137–147)
TOTAL PROTEIN: 7.3 g/dL (ref 6.0–8.3)
Total Bilirubin: <0.2 mg/dL — ABNORMAL LOW (ref 0.3–1.2)

## 2013-08-20 MED ORDER — ONDANSETRON HCL 4 MG/2ML IJ SOLN
4.0000 mg | Freq: Once | INTRAMUSCULAR | Status: AC
  Administered 2013-08-20: 4 mg via INTRAVENOUS
  Filled 2013-08-20: qty 2

## 2013-08-20 MED ORDER — IOHEXOL 300 MG/ML  SOLN
50.0000 mL | Freq: Once | INTRAMUSCULAR | Status: AC | PRN
  Administered 2013-08-20: 50 mL via ORAL

## 2013-08-20 MED ORDER — HYOSCYAMINE SULFATE 0.125 MG SL SUBL: 0.1250 mg | SUBLINGUAL_TABLET | SUBLINGUAL | Status: DC | PRN

## 2013-08-20 MED ORDER — HYDROMORPHONE HCL PF 1 MG/ML IJ SOLN
0.5000 mg | Freq: Once | INTRAMUSCULAR | Status: AC
  Administered 2013-08-20: 0.5 mg via INTRAVENOUS
  Filled 2013-08-20: qty 1

## 2013-08-20 MED ORDER — ONDANSETRON HCL 4 MG PO TABS: 4.0000 mg | ORAL_TABLET | Freq: Four times a day (QID) | ORAL | Status: DC

## 2013-08-20 MED ORDER — IOHEXOL 300 MG/ML  SOLN
100.0000 mL | Freq: Once | INTRAMUSCULAR | Status: AC | PRN
  Administered 2013-08-20: 100 mL via INTRAVENOUS

## 2013-08-20 MED ORDER — SODIUM CHLORIDE 0.9 % IV SOLN
Freq: Once | INTRAVENOUS | Status: AC
Start: 2013-08-20 — End: 2013-08-20
  Administered 2013-08-20: 15:00:00 via INTRAVENOUS

## 2013-08-20 NOTE — Discharge Instructions (Signed)
Abdominal Pain, Women °Abdominal (stomach, pelvic, or belly) pain can be caused by many things. It is important to tell your doctor: °· The location of the pain. °· Does it come and go or is it present all the time? °· Are there things that start the pain (eating certain foods, exercise)? °· Are there other symptoms associated with the pain (fever, nausea, vomiting, diarrhea)? °All of this is helpful to know when trying to find the cause of the pain. °CAUSES  °· Stomach: virus or bacteria infection, or ulcer. °· Intestine: appendicitis (inflamed appendix), regional ileitis (Crohn's disease), ulcerative colitis (inflamed colon), irritable bowel syndrome, diverticulitis (inflamed diverticulum of the colon), or cancer of the stomach or intestine. °· Gallbladder disease or stones in the gallbladder. °· Kidney disease, kidney stones, or infection. °· Pancreas infection or cancer. °· Fibromyalgia (pain disorder). °· Diseases of the female organs: °· Uterus: fibroid (non-cancerous) tumors or infection. °· Fallopian tubes: infection or tubal pregnancy. °· Ovary: cysts or tumors. °· Pelvic adhesions (scar tissue). °· Endometriosis (uterus lining tissue growing in the pelvis and on the pelvic organs). °· Pelvic congestion syndrome (female organs filling up with blood just before the menstrual period). °· Pain with the menstrual period. °· Pain with ovulation (producing an egg). °· Pain with an IUD (intrauterine device, birth control) in the uterus. °· Cancer of the female organs. °· Functional pain (pain not caused by a disease, may improve without treatment). °· Psychological pain. °· Depression. °DIAGNOSIS  °Your doctor will decide the seriousness of your pain by doing an examination. °· Blood tests. °· X-rays. °· Ultrasound. °· CT scan (computed tomography, special type of X-ray). °· MRI (magnetic resonance imaging). °· Cultures, for infection. °· Barium enema (dye inserted in the large intestine, to better view it with  X-rays). °· Colonoscopy (looking in intestine with a lighted tube). °· Laparoscopy (minor surgery, looking in abdomen with a lighted tube). °· Major abdominal exploratory surgery (looking in abdomen with a large incision). °TREATMENT  °The treatment will depend on the cause of the pain.  °· Many cases can be observed and treated at home. °· Over-the-counter medicines recommended by your caregiver. °· Prescription medicine. °· Antibiotics, for infection. °· Birth control pills, for painful periods or for ovulation pain. °· Hormone treatment, for endometriosis. °· Nerve blocking injections. °· Physical therapy. °· Antidepressants. °· Counseling with a psychologist or psychiatrist. °· Minor or major surgery. °HOME CARE INSTRUCTIONS  °· Do not take laxatives, unless directed by your caregiver. °· Take over-the-counter pain medicine only if ordered by your caregiver. Do not take aspirin because it can cause an upset stomach or bleeding. °· Try a clear liquid diet (broth or water) as ordered by your caregiver. Slowly move to a bland diet, as tolerated, if the pain is related to the stomach or intestine. °· Have a thermometer and take your temperature several times a day, and record it. °· Bed rest and sleep, if it helps the pain. °· Avoid sexual intercourse, if it causes pain. °· Avoid stressful situations. °· Keep your follow-up appointments and tests, as your caregiver orders. °· If the pain does not go away with medicine or surgery, you may try: °· Acupuncture. °· Relaxation exercises (yoga, meditation). °· Group therapy. °· Counseling. °SEEK MEDICAL CARE IF:  °· You notice certain foods cause stomach pain. °· Your home care treatment is not helping your pain. °· You need stronger pain medicine. °· You want your IUD removed. °· You feel faint or   lightheaded. °· You develop nausea and vomiting. °· You develop a rash. °· You are having side effects or an allergy to your medicine. °SEEK IMMEDIATE MEDICAL CARE IF:  °· Your  pain does not go away or gets worse. °· You have a fever. °· Your pain is felt only in portions of the abdomen. The right side could possibly be appendicitis. The left lower portion of the abdomen could be colitis or diverticulitis. °· You are passing blood in your stools (bright red or black tarry stools, with or without vomiting). °· You have blood in your urine. °· You develop chills, with or without a fever. °· You pass out. °MAKE SURE YOU:  °· Understand these instructions. °· Will watch your condition. °· Will get help right away if you are not doing well or get worse. °Document Released: 01/12/2007 Document Revised: 06/09/2011 Document Reviewed: 02/01/2009 °[employer]® Patient Information ©2014 [employer]. ° °

## 2013-08-20 NOTE — ED Notes (Signed)
Onset of right side/flank pain three days ago.  Denies injury.  C/o nausea, diarrhea.  No vomiting, fever.  Denies abnormal vaginal discharge, dysuria.

## 2013-08-20 NOTE — ED Provider Notes (Signed)
CSN: [id number]     Arrival date & time 08/20/13  1331 History   First MD Initiated Contact with Patient 08/20/13 1412     Chief Complaint  Patient presents with  . Flank Pain     (Consider location/radiation/quality/duration/timing/severity/associated sxs/prior Treatment) Patient is a 23 y.o. female presenting with abdominal pain. The history is provided by the patient. No language interpreter was used.  Abdominal Pain Pain location:  RUQ and RLQ Pain quality: sharp and shooting   Pain radiates to:  R flank Pain severity:  Severe Duration:  3 days Timing:  Constant Associated symptoms: diarrhea and nausea   Associated symptoms: no chills, no dysuria, no fever, no vaginal discharge and no vomiting   Associated symptoms comment:  Right sided abdominal pain that wraps around to the right flank, present for 3 days. No fever. She has had nausea without vomiting. She has also had non-bloody diarrhea. No aggravating or alleviating factors. She reports a loss of appetite.    Past Medical History  Diagnosis Date  . Thyroid disease   . Acid reflux   . Depression    History reviewed. No pertinent past surgical history. No family history on file. History  Substance Use Topics  . Smoking status: Current Every Day Smoker -- 0.50 packs/day for 8 years    Types: Cigarettes  . Smokeless tobacco: Not on file  . Alcohol Use: Yes   OB History   Grav Para Term Preterm Abortions TAB SAB Ect Mult Living                 Review of Systems  Constitutional: Negative for fever and chills.  HENT: Negative.   Respiratory: Negative.   Cardiovascular: Negative.   Gastrointestinal: Positive for nausea, abdominal pain and diarrhea. Negative for vomiting and blood in stool.  Genitourinary: Positive for flank pain. Negative for dysuria and vaginal discharge.  Musculoskeletal: Negative.  Negative for myalgias.  Skin: Negative.   Neurological: Negative.       Allergies  Review of patient's  allergies indicates no known allergies.  Home Medications   Prior to Admission medications   Medication Sig Start Date End Date Taking? Authorizing Provider  levothyroxine (SYNTHROID, LEVOTHROID) 100 MCG tablet Take 100 mcg by mouth daily before breakfast.   Yes Historical Provider, MD  cyanocobalamin 500 MCG tablet Take 500 mcg by mouth daily.    Historical Provider, MD  diazepam (VALIUM) 5 MG tablet Take one tablet as needed for muscle relaxer at bedtime 07/27/12   Glade Nurse, PA-C  HYDROcodone-acetaminophen (NORCO/VICODIN) 5-325 MG per tablet Take one tablet every 6 hours as needed for pain 07/27/12   Glade Nurse, PA-C  levonorgestrel (MIRENA) 20 MCG/24HR IUD 1 each by Intrauterine route once.      Historical Provider, MD  Multiple Vitamin (MULTIVITAMIN WITH MINERALS) TABS Take 1 tablet by mouth daily.    Historical Provider, MD   BP 128/81  Pulse 63  Temp(Src) 98.7 F (37.1 C) (Oral)  Resp 18  Ht 5\' 7"  (1.702 m)  Wt 206 lb (93.441 kg)  BMI 32.26 kg/m2  SpO2 100%  LMP 08/10/2013 Physical Exam  Constitutional: She is oriented to person, place, and time. She appears well-developed and well-nourished. No distress.  HENT:  Head: Normocephalic.  Neck: Normal range of motion. Neck supple.  Cardiovascular: Normal rate and regular rhythm.   Pulmonary/Chest: Effort normal and breath sounds normal.  Abdominal: Soft. Bowel sounds are normal. She exhibits no mass. There is tenderness. There is guarding.  There is no rebound.  RUQ and RLQ tenderness without rebounding. She is mildly guarding. Soft, obese abdomen without rigidity.   Musculoskeletal: Normal range of motion.  Neurological: She is alert and oriented to person, place, and time.  Skin: Skin is warm and dry. No rash noted.  Psychiatric: She has a normal mood and affect.    ED Course  Procedures (including critical care time) Labs Review Labs Reviewed  COMPREHENSIVE METABOLIC PANEL - Abnormal; Notable for the following:     BUN 5 (*)    Total Bilirubin <0.2 (*)    All other components within normal limits  URINALYSIS, ROUTINE W REFLEX MICROSCOPIC  PREGNANCY, URINE  CBC WITH DIFFERENTIAL  LIPASE, BLOOD    Imaging Review [place] Abdomen Complete  08/20/2013   CLINICAL DATA:  Right flank pain  EXAM: ULTRASOUND ABDOMEN COMPLETE  COMPARISON:  CT abdomen 01/22/2012  FINDINGS: Gallbladder:  No gallstones or wall thickening visualized. No sonographic Murphy sign noted.  Common bile duct:  Diameter: 2.0 mm  Liver:  No focal lesion identified. Within normal limits in parenchymal echogenicity.  IVC:  No abnormality visualized.  Pancreas:  Limited  Spleen:  Size and appearance within normal limits.  Right Kidney:  Length: 12.3 cm. Echogenicity within normal limits. No mass or hydronephrosis visualized.  Left Kidney:  Length: 11.8 cm. Echogenicity within normal limits. No mass or hydronephrosis visualized.  Abdominal aorta:  No aneurysm visualized.  Other findings:  None.  IMPRESSION: Negative   Electronically Signed   By: Marlan Palauharles  Clark M.D.   On: 08/20/2013 16:28   Ct Abdomen Pelvis W Contrast  08/20/2013   CLINICAL DATA:  23 year old female with right abdominal and pelvic. Nausea and diarrhea.  EXAM: CT ABDOMEN AND PELVIS WITH CONTRAST  TECHNIQUE: Multidetector CT imaging of the abdomen and pelvis was performed using the standard protocol following bolus administration of intravenous contrast.  CONTRAST:  100mL OMNIPAQUE IOHEXOL 300 MG/ML  SOLN  COMPARISON:  01/22/2012  FINDINGS: The liver, gallbladder, spleen, adrenal glands, pancreas and kidneys are unremarkable.  There is no evidence of enlarged lymph nodes, biliary dilation or abdominal aortic aneurysm.  The bowel, bladder and appendix are unremarkable. There is no evidence of bowel obstruction, abscess or pneumoperitoneum.  A trace amount of free pelvic fluid may be physiologic.  An IUD is again noted with abnormal orientation from expected with the base directed toward the right  cornua.  No acute bony abnormalities are identified.  IMPRESSION: Trace amount free pelvic fluid which may be physiologic.  No other acute abnormalities identified.  Unchanged abnormal IUD position.   Electronically Signed   By: Laveda AbbeJeff  Hu M.D.   On: 08/20/2013 18:53     EKG Interpretation None      MDM   Final diagnoses:  None    1. Abdominal pain  All tests are essentially negative. Pain has been controlled in the ED. No change in VS - remains stable. US and CT performed secondary to persistent tenderness on serial re-examination. No source of pain identified. She reports she is "starving" in contrast to anorexia initially presented - improvement? She is stable for discharge and follow up with PCP next week.     Arnoldo HookerShari A Fatin Bachicha, PA-C 08/20/13 1925

## 2013-08-20 NOTE — ED Provider Notes (Signed)
Medical screening examination/treatment/procedure(s) were performed by non-physician practitioner and as supervising physician I was immediately available for consultation/collaboration.   EKG Interpretation None        Rolan Bucco, MD 08/20/13 2349

## 2014-01-02 ENCOUNTER — Encounter (HOSPITAL_BASED_OUTPATIENT_CLINIC_OR_DEPARTMENT_OTHER): Payer: Self-pay | Admitting: Emergency Medicine

## 2014-01-02 ENCOUNTER — Emergency Department (HOSPITAL_BASED_OUTPATIENT_CLINIC_OR_DEPARTMENT_OTHER): Payer: Medicaid Other

## 2014-01-02 ENCOUNTER — Emergency Department (HOSPITAL_BASED_OUTPATIENT_CLINIC_OR_DEPARTMENT_OTHER)
Admission: EM | Admit: 2014-01-02 | Discharge: 2014-01-02 | Disposition: A | Discharge status: Home / Self Care | Payer: Medicaid Other | Attending: Emergency Medicine | Admitting: Emergency Medicine

## 2014-01-02 DIAGNOSIS — R102 Pelvic and perineal pain: Secondary | ICD-10-CM | POA: Diagnosis not present

## 2014-01-02 DIAGNOSIS — Z79899 Other long term (current) drug therapy: Secondary | ICD-10-CM | POA: Insufficient documentation

## 2014-01-02 DIAGNOSIS — E079 Disorder of thyroid, unspecified: Secondary | ICD-10-CM | POA: Insufficient documentation

## 2014-01-02 DIAGNOSIS — B9689 Other specified bacterial agents as the cause of diseases classified elsewhere: Secondary | ICD-10-CM

## 2014-01-02 DIAGNOSIS — Z8719 Personal history of other diseases of the digestive system: Secondary | ICD-10-CM | POA: Insufficient documentation

## 2014-01-02 DIAGNOSIS — Z3202 Encounter for pregnancy test, result negative: Secondary | ICD-10-CM | POA: Diagnosis not present

## 2014-01-02 DIAGNOSIS — Z8659 Personal history of other mental and behavioral disorders: Secondary | ICD-10-CM | POA: Diagnosis not present

## 2014-01-02 DIAGNOSIS — N760 Acute vaginitis: Secondary | ICD-10-CM | POA: Insufficient documentation

## 2014-01-02 DIAGNOSIS — Z720 Tobacco use: Secondary | ICD-10-CM | POA: Diagnosis not present

## 2014-01-02 DIAGNOSIS — R109 Unspecified abdominal pain: Secondary | ICD-10-CM | POA: Diagnosis present

## 2014-01-02 LAB — URINALYSIS, ROUTINE W REFLEX MICROSCOPIC
BILIRUBIN URINE: NEGATIVE
Glucose, UA: NEGATIVE mg/dL
HGB URINE DIPSTICK: NEGATIVE
Ketones, ur: 15 mg/dL — AB
Nitrite: NEGATIVE
PROTEIN: NEGATIVE mg/dL
Specific Gravity, Urine: 1.025 (ref 1.005–1.030)
UROBILINOGEN UA: 0.2 mg/dL (ref 0.0–1.0)
pH: 6 (ref 5.0–8.0)

## 2014-01-02 LAB — CBC WITH DIFFERENTIAL/PLATELET
Basophils Absolute: 0 10*3/uL (ref 0.0–0.1)
Basophils Relative: 0 % (ref 0–1)
EOS ABS: 0.2 10*3/uL (ref 0.0–0.7)
EOS PCT: 2 % (ref 0–5)
HCT: 37.8 % (ref 36.0–46.0)
Hemoglobin: 13 g/dL (ref 12.0–15.0)
LYMPHS ABS: 2.4 10*3/uL (ref 0.7–4.0)
Lymphocytes Relative: 31 % (ref 12–46)
MCH: 30.5 pg (ref 26.0–34.0)
MCHC: 34.4 g/dL (ref 30.0–36.0)
MCV: 88.7 fL (ref 78.0–100.0)
Monocytes Absolute: 0.6 10*3/uL (ref 0.1–1.0)
Monocytes Relative: 8 % (ref 3–12)
Neutro Abs: 4.4 10*3/uL (ref 1.7–7.7)
Neutrophils Relative %: 59 % (ref 43–77)
PLATELETS: 235 10*3/uL (ref 150–400)
RBC: 4.26 MIL/uL (ref 3.87–5.11)
RDW: 12.3 % (ref 11.5–15.5)
WBC: 7.6 10*3/uL (ref 4.0–10.5)

## 2014-01-02 LAB — COMPREHENSIVE METABOLIC PANEL
ALT: 18 U/L (ref 0–35)
ANION GAP: 11 (ref 5–15)
AST: 14 U/L (ref 0–37)
Albumin: 3.5 g/dL (ref 3.5–5.2)
Alkaline Phosphatase: 65 U/L (ref 39–117)
BUN: 9 mg/dL (ref 6–23)
CO2: 25 mEq/L (ref 19–32)
Calcium: 9.1 mg/dL (ref 8.4–10.5)
Chloride: 103 mEq/L (ref 96–112)
Creatinine, Ser: 0.8 mg/dL (ref 0.50–1.10)
GFR calc non Af Amer: >90 mL/min (ref >90)
GLUCOSE: 94 mg/dL (ref 70–99)
Potassium: 3.6 mEq/L — ABNORMAL LOW (ref 3.7–5.3)
SODIUM: 139 meq/L (ref 137–147)
TOTAL PROTEIN: 6.9 g/dL (ref 6.0–8.3)
Total Bilirubin: 0.4 mg/dL (ref 0.3–1.2)

## 2014-01-02 LAB — WET PREP, GENITAL
Trich, Wet Prep: NONE SEEN
YEAST WET PREP: NONE SEEN

## 2014-01-02 LAB — URINE MICROSCOPIC-ADD ON

## 2014-01-02 LAB — PREGNANCY, URINE: Preg Test, Ur: NEGATIVE

## 2014-01-02 LAB — HIV ANTIBODY (ROUTINE TESTING W REFLEX): HIV 1&2 Ab, 4th Generation: NONREACTIVE

## 2014-01-02 LAB — LIPASE, BLOOD: Lipase: 26 U/L (ref 11–59)

## 2014-01-02 MED ORDER — METRONIDAZOLE 500 MG PO TABS
2000.0000 mg | ORAL_TABLET | Freq: Once | ORAL | Status: AC
  Administered 2014-01-02: 2000 mg via ORAL
  Filled 2014-01-02: qty 4

## 2014-01-02 MED ORDER — IOHEXOL 300 MG/ML  SOLN: 50.0000 mL | Freq: Once | INTRAMUSCULAR | Status: DC | PRN

## 2014-01-02 MED ORDER — ONDANSETRON HCL 4 MG/2ML IJ SOLN
4.0000 mg | Freq: Once | INTRAMUSCULAR | Status: AC
  Administered 2014-01-02: 4 mg via INTRAVENOUS
  Filled 2014-01-02: qty 2

## 2014-01-02 MED ORDER — ONDANSETRON 8 MG PO TBDP
8.0000 mg | ORAL_TABLET | Freq: Once | ORAL | Status: AC
  Administered 2014-01-02: 8 mg via ORAL
  Filled 2014-01-02: qty 1

## 2014-01-02 MED ORDER — SODIUM CHLORIDE 0.9 % IV BOLUS (SEPSIS)
1000.0000 mL | Freq: Once | INTRAVENOUS | Status: AC
  Administered 2014-01-02: 1000 mL via INTRAVENOUS

## 2014-01-02 MED ORDER — IOHEXOL 300 MG/ML  SOLN
100.0000 mL | Freq: Once | INTRAMUSCULAR | Status: AC | PRN
  Administered 2014-01-02: 100 mL via INTRAVENOUS

## 2014-01-02 NOTE — ED Notes (Signed)
Lower abdominal pain radiating to back since yesterday.  Some nausea.  No V/D.  No vaginal discharge.  No dysuria.

## 2014-01-02 NOTE — ED Provider Notes (Signed)
CSN: [id number]     Arrival date & time 01/02/14  1046 History   First MD Initiated Contact with Patient 01/02/14 1139     Chief Complaint  Patient presents with  . Abdominal Pain     (Consider location/radiation/quality/duration/timing/severity/associated sxs/prior Treatment) HPI This is a 23 year old female G1 P1 presents today complaining of lower abdominal pain since yesterday. Describes the pain as sharp. It has some increased with movement. She has had some nausea but no vomiting or diarrhea. Denies any vaginal discharge, painful intercourse, or urinary tract infection symptoms. She states that her last menstrual period was 11 days ago and was normal in timing although somewhat heavy. She has had 2 similar episodes of pain like this in the past that had no definitive diagnosis. She has taken over-the-counter medicine with some relief of pain has returned. She has not had any abdominal surgery in the past. Past Medical History  Diagnosis Date  . Thyroid disease   . Acid reflux   . Depression    No past surgical history on file. No family history on file. History  Substance Use Topics  . Smoking status: Current Every Day Smoker -- 0.50 packs/day for 8 years    Types: Cigarettes  . Smokeless tobacco: Not on file  . Alcohol Use: Yes   OB History   Grav Para Term Preterm Abortions TAB SAB Ect Mult Living                 Review of Systems  All other systems reviewed and are negative.     Allergies  Review of patient's allergies indicates no known allergies.  Home Medications   Prior to Admission medications   Medication Sig Start Date End Date Taking? Authorizing Provider  levonorgestrel (MIRENA) 20 MCG/24HR IUD 1 each by Intrauterine route once.      Historical Provider, MD  levothyroxine (SYNTHROID, LEVOTHROID) 100 MCG tablet Take 125 mcg by mouth daily before breakfast.     Historical Provider, MD   BP 139/77  Pulse 96  Temp(Src) 98.2 F (36.8 C) (Oral)  Resp 18   Ht 5\' 7"  (1.702 m)  Wt 212 lb 5 oz (96.304 kg)  BMI 33.24 kg/m2  SpO2 99%  LMP 12/15/2013 Physical Exam  Nursing note and vitals reviewed. Constitutional: She is oriented to person, place, and time. She appears well-developed and well-nourished.  HENT:  Head: Normocephalic and atraumatic.  Right Ear: External ear normal.  Left Ear: External ear normal.  Nose: Nose normal.  Mouth/Throat: Oropharynx is clear and moist.  Eyes: Conjunctivae and EOM are normal. Pupils are equal, round, and reactive to light.  Neck: Normal range of motion. Neck supple. No JVD present. No tracheal deviation present. No thyromegaly present.  Cardiovascular: Normal rate, regular rhythm, normal heart sounds and intact distal pulses.   Pulmonary/Chest: Effort normal and breath sounds normal. No respiratory distress. She has no wheezes.  Abdominal: Soft. Bowel sounds are normal. She exhibits no mass. There is tenderness. There is no guarding.  Mild diffuse tenderness to palpation with most tenderness noted in the right lower quadrant. No rebound is noted. No masses are noted. Abdomen is soft.  Genitourinary: Vaginal discharge found.  Mild white vaginal discharge. Uterus is midline and nontender to palpation with no cervical motion tenderness noted. No lateralized palpable masses or tenderness.  Musculoskeletal: Normal range of motion.  Lymphadenopathy:    She has no cervical adenopathy.  Neurological: She is alert and oriented to person, place, and time. She  has normal reflexes. No cranial nerve deficit or sensory deficit. Gait normal. GCS eye subscore is 4. GCS verbal subscore is 5. GCS motor subscore is 6.  Reflex Scores:      Bicep reflexes are 2+ on the right side and 2+ on the left side.      Patellar reflexes are 2+ on the right side and 2+ on the left side. Skin: Skin is warm and dry.  Psychiatric: She has a normal mood and affect. Her behavior is normal. Judgment and thought content normal.    ED  Course  Procedures (including critical care time) Labs Review Labs Reviewed  WET PREP, GENITAL - Abnormal; Notable for the following:    Clue Cells Wet Prep HPF POC MODERATE (*)    WBC, Wet Prep HPF POC MODERATE (*)    All other components within normal limits  URINALYSIS, ROUTINE W REFLEX MICROSCOPIC - Abnormal; Notable for the following:    APPearance CLOUDY (*)    Ketones, ur 15 (*)    Leukocytes, UA SMALL (*)    All other components within normal limits  URINE MICROSCOPIC-ADD ON - Abnormal; Notable for the following:    Squamous Epithelial / LPF FEW (*)    Bacteria, UA MANY (*)    All other components within normal limits  COMPREHENSIVE METABOLIC PANEL - Abnormal; Notable for the following:    Potassium 3.6 (*)    All other components within normal limits  GC/CHLAMYDIA PROBE AMP  PREGNANCY, URINE  CBC WITH DIFFERENTIAL  LIPASE, BLOOD  HIV ANTIBODY (ROUTINE TESTING)    Imaging Review Ct Abdomen Pelvis W Contrast  01/02/2014   CLINICAL DATA:  RIGHT lower quadrant pain for 2 days, nausea  EXAM: CT ABDOMEN AND PELVIS WITH CONTRAST  TECHNIQUE: Multidetector CT imaging of the abdomen and pelvis was performed using the standard protocol following bolus administration of intravenous contrast. Sagittal and coronal MPR images reconstructed from axial data set.  CONTRAST:  100mL OMNIPAQUE IOHEXOL 300 MG/ML SOLN IV. Dilute oral contrast.  COMPARISON:  08/20/2013  FINDINGS: Lung bases clear.  Liver, spleen, pancreas, kidneys, and adrenal glands normal.  Normal appendix, bladder, and ureter.  IUD within uterus, appears malpositioned with long stem directed towards RIGHT cornua without definite extension to the serosa.  Large RIGHT ovarian cyst 5.6 x 4.2 x 4.8 cm, new.  LEFT ovary normal appearance.  Minimal or free fluid adjacent to RIGHT ovary, nonspecific.  Stomach and bowel loops normal appearance.  No mass, adenopathy, free air, or hernia.  No acute osseous findings.  IMPRESSION: Large RIGHT  ovarian cyst 5.6 cm in greatest size.  Minimal free fluid in RIGHT pelvis, nonspecific.  Malpositioned IUD with long stem directed to RIGHT cornua.  Remainder of exam unremarkable.   Electronically Signed   By: Ulyses SouthwardMark  Boles M.D.   On: 01/02/2014 13:14     EKG Interpretation None      MDM   Final diagnoses:  Pelvic pain in female  BV (bacterial vaginosis)    23 year old female with pelvic pain with no acute abnormalities noted on CT scan for labs.  Patient feels improved after treatment here. I discussed findings with patient and her mother. She'll be treated for bacterial vaginosis. She understands that she should return if her symptoms, specifically pain, vomiting, or fever or worse.   Hilario Quarryanielle S Ashlon Lottman, MD 01/02/14 [phone]

## 2014-01-02 NOTE — Discharge Instructions (Signed)
Bacterial Vaginosis Bacterial vaginosis is an infection of the vagina. It happens when too many of certain germs (bacteria) grow in the vagina. HOME CARE  Take your medicine as told by your doctor.  Finish your medicine even if you start to feel better.  Do not have sex until you finish your medicine and are better.  Tell your sex partner that you have an infection. They should see their doctor for treatment.  Practice safe sex. Use condoms. Have only one sex partner. GET HELP IF:  You are not getting better after 3 days of treatment.  You have more grey fluid (discharge) coming from your vagina than before.  You have more pain than before.  You have a fever. MAKE SURE YOU:   Understand these instructions.  Will watch your condition.  Will get help right away if you are not doing well or get worse. Document Released: 12/25/2007 Document Revised: 01/05/2013 Document Reviewed: 10/27/2012 [employer] Patient Information 2015 [employer]. This information is not intended to replace advice given to you by your health care provider. Make sure you discuss any questions you have with your health care provider.  

## 2014-01-03 LAB — GC/CHLAMYDIA PROBE AMP
CT PROBE, AMP APTIMA: NEGATIVE
GC Probe RNA: NEGATIVE

## 2014-02-17 ENCOUNTER — Emergency Department (HOSPITAL_BASED_OUTPATIENT_CLINIC_OR_DEPARTMENT_OTHER): Payer: Medicaid Other

## 2014-02-17 ENCOUNTER — Encounter (HOSPITAL_BASED_OUTPATIENT_CLINIC_OR_DEPARTMENT_OTHER): Payer: Self-pay

## 2014-02-17 ENCOUNTER — Emergency Department (HOSPITAL_BASED_OUTPATIENT_CLINIC_OR_DEPARTMENT_OTHER)
Admission: EM | Admit: 2014-02-17 | Discharge: 2014-02-18 | Disposition: A | Discharge status: Home / Self Care | Payer: Medicaid Other | Attending: Emergency Medicine | Admitting: Emergency Medicine

## 2014-02-17 DIAGNOSIS — Z720 Tobacco use: Secondary | ICD-10-CM | POA: Diagnosis not present

## 2014-02-17 DIAGNOSIS — Z8659 Personal history of other mental and behavioral disorders: Secondary | ICD-10-CM | POA: Diagnosis not present

## 2014-02-17 DIAGNOSIS — IMO0001 Reserved for inherently not codable concepts without codable children: Secondary | ICD-10-CM

## 2014-02-17 DIAGNOSIS — Z3202 Encounter for pregnancy test, result negative: Secondary | ICD-10-CM | POA: Diagnosis not present

## 2014-02-17 DIAGNOSIS — R1084 Generalized abdominal pain: Secondary | ICD-10-CM | POA: Insufficient documentation

## 2014-02-17 DIAGNOSIS — Z8719 Personal history of other diseases of the digestive system: Secondary | ICD-10-CM | POA: Insufficient documentation

## 2014-02-17 DIAGNOSIS — Z79899 Other long term (current) drug therapy: Secondary | ICD-10-CM | POA: Diagnosis not present

## 2014-02-17 DIAGNOSIS — E039 Hypothyroidism, unspecified: Secondary | ICD-10-CM | POA: Insufficient documentation

## 2014-02-17 DIAGNOSIS — R109 Unspecified abdominal pain: Secondary | ICD-10-CM

## 2014-02-17 DIAGNOSIS — R52 Pain, unspecified: Secondary | ICD-10-CM

## 2014-02-17 DIAGNOSIS — N898 Other specified noninflammatory disorders of vagina: Secondary | ICD-10-CM | POA: Diagnosis not present

## 2014-02-17 HISTORY — DX: Hypothyroidism, unspecified: E03.9

## 2014-02-17 LAB — URINALYSIS, ROUTINE W REFLEX MICROSCOPIC
Bilirubin Urine: NEGATIVE
GLUCOSE, UA: NEGATIVE mg/dL
Hgb urine dipstick: NEGATIVE
Ketones, ur: NEGATIVE mg/dL
LEUKOCYTES UA: NEGATIVE
Nitrite: NEGATIVE
PROTEIN: NEGATIVE mg/dL
Specific Gravity, Urine: 1.014 (ref 1.005–1.030)
UROBILINOGEN UA: 0.2 mg/dL (ref 0.0–1.0)
pH: 6.5 (ref 5.0–8.0)

## 2014-02-17 LAB — PREGNANCY, URINE: Preg Test, Ur: NEGATIVE

## 2014-02-17 MED ORDER — KETOROLAC TROMETHAMINE 60 MG/2ML IM SOLN
60.0000 mg | Freq: Once | INTRAMUSCULAR | Status: AC
  Administered 2014-02-17: 60 mg via INTRAMUSCULAR
  Filled 2014-02-17: qty 2

## 2014-02-17 MED ORDER — DICYCLOMINE HCL 10 MG/ML IM SOLN
20.0000 mg | Freq: Once | INTRAMUSCULAR | Status: AC
  Administered 2014-02-17: 20 mg via INTRAMUSCULAR
  Filled 2014-02-17: qty 2

## 2014-02-17 NOTE — ED Notes (Signed)
Pt reports heavy white vaginal discharge.

## 2014-02-17 NOTE — ED Notes (Signed)
Pt reports nausea and abdominal x 2 weeks. Denies fever or diarrhea.

## 2014-02-17 NOTE — ED Provider Notes (Signed)
CSN: [id number]     Arrival date & time 02/17/14  1914 History  This chart was scribed for non-physician practitioner working with Mubashir Mallek Smitty Cords, MD by Elveria Rising, ED Scribe. This patient was seen in room MH12/MH12 and the patient's care was started at 11:02 PM.   Chief Complaint  Patient presents with  . Abdominal Pain   Patient is a 23 y.o. female presenting with abdominal pain. The history is provided by the patient. No language interpreter was used.  Abdominal Pain Pain location:  Generalized Pain quality: cramping   Pain radiates to:  Does not radiate Pain severity:  Moderate Onset quality:  Gradual Timing:  Constant Progression:  Unchanged Context: not alcohol use   Relieved by:  Nothing Worsened by:  Nothing tried Ineffective treatments:  None tried Associated symptoms: nausea, vaginal discharge and vomiting   Associated symptoms: no chills, no dysuria and no fever   Risk factors: no alcohol abuse and not pregnant    HPI Comments: Taylor Donovan is a 23 y.o. female who presents to the Emergency Department complaining of bloated cramping abdominal pain for two weeks. Patient reports associated nausea and vomiting. Patient shares history of ovarian cysts, but states that this pain isn't similar. Patient reports that her last bowel movement was today. Patient also reports presence profuse amounts of white, non odorous vaginal discharge for one week. Patient reports that the smell and appearance of discharge is normal; she is just experiencing increased amounts. Patient denies additional urinary symptoms.   Past Medical History  Diagnosis Date  . Thyroid disease   . Acid reflux   . Depression   . Hypothyroid    History reviewed. No pertinent past surgical history. No family history on file. History  Substance Use Topics  . Smoking status: Current Every Day Smoker -- 0.50 packs/day for 8 years    Types: Cigarettes  . Smokeless tobacco: Not on file  . Alcohol  Use: Yes   OB History    No data available     Review of Systems  Constitutional: Negative for fever and chills.  Gastrointestinal: Positive for nausea, vomiting and abdominal pain.  Genitourinary: Positive for vaginal discharge. Negative for dysuria.  All other systems reviewed and are negative.     Allergies  Review of patient's allergies indicates no known allergies.  Home Medications   Prior to Admission medications   Medication Sig Start Date End Date Taking? Authorizing Provider  levonorgestrel (MIRENA) 20 MCG/24HR IUD 1 each by Intrauterine route once.      Historical Provider, MD  levothyroxine (SYNTHROID, LEVOTHROID) 100 MCG tablet Take 125 mcg by mouth daily before breakfast.     Historical Provider, MD   Triage Vitals: BP 129/72 mmHg  Pulse 83  Temp(Src) 98.4 F (36.9 C) (Oral)  Resp 18  Ht 5\' 7"  (1.702 m)  Wt 215 lb (97.523 kg)  BMI 33.67 kg/m2  SpO2 97%  LMP 01/31/2014 Physical Exam  Constitutional: She is oriented to person, place, and time. She appears well-developed and well-nourished. No distress.  HENT:  Head: Normocephalic and atraumatic.  Mouth/Throat: No oropharyngeal exudate.  Eyes: EOM are normal. Pupils are equal, round, and reactive to light.  Neck: Normal range of motion. Neck supple. No tracheal deviation present.  Cardiovascular: Normal rate.   Pulmonary/Chest: Effort normal and breath sounds normal. No respiratory distress.  Abdominal: Soft. Bowel sounds are increased. There is no tenderness. There is no rigidity, no rebound, no guarding, no tenderness at McBurney's point and  negative Murphy's sign.  Hyperactive bowel sounds  Genitourinary: Vaginal discharge found.  Scant white discharge no CMT chaperone present  Musculoskeletal: Normal range of motion.  Neurological: She is alert and oriented to person, place, and time.  Skin: Skin is warm and dry.  Psychiatric: She has a normal mood and affect. Her behavior is normal.  Nursing note  and vitals reviewed.   ED Course  Procedures (including critical care time)  COORDINATION OF CARE: 11:02 PM- Discussed treatment plan with patient at bedside and patient agreed to plan.   Labs Review Labs Reviewed  URINALYSIS, ROUTINE W REFLEX MICROSCOPIC  PREGNANCY, URINE    Imaging Review No results found.   EKG Interpretation None      MDM   Final diagnoses:  None   Pain relieved post medication.  Will treat for cramping and gas ans pain is where gas is most active.  Follow up with your family doctor for recheck.  Strict return precautions given patient verbalizes understanding and agrees to follow up I personally performed the services described in this documentation, which was scribed in my presence. The recorded information has been reviewed and is accurate.    Jasmine AweApril K Raigan Baria-Rasch, MD 02/18/14 [phone]

## 2014-02-17 NOTE — ED Notes (Signed)
Generalized abdominal pain x 2 week with nausea, no vomiting. Pain is intermittent, cramping, doesn't feel like her typical ovarian cyst pain, and feels bloated. LBM today, bowel sound present.   Vaginal discharge x 1 week, white, no odor, not much difference from her normal discharge except in larger amount. Denies dysuria, no itchiness. No sexually active currently.

## 2014-02-18 ENCOUNTER — Encounter (HOSPITAL_BASED_OUTPATIENT_CLINIC_OR_DEPARTMENT_OTHER): Payer: Self-pay | Admitting: Emergency Medicine

## 2014-02-18 LAB — WET PREP, GENITAL
Clue Cells Wet Prep HPF POC: NONE SEEN
Trich, Wet Prep: NONE SEEN
Yeast Wet Prep HPF POC: NONE SEEN

## 2014-02-18 MED ORDER — NAPROXEN 500 MG PO TABS
500.0000 mg | ORAL_TABLET | Freq: Two times a day (BID) | ORAL | Status: DC
Start: 2014-02-18 — End: 2014-08-15

## 2014-02-18 MED ORDER — OMEPRAZOLE 20 MG PO CPDR: 20.0000 mg | DELAYED_RELEASE_CAPSULE | Freq: Every day | ORAL | Status: DC

## 2014-02-18 MED ORDER — DICYCLOMINE HCL 20 MG PO TABS: 20.0000 mg | ORAL_TABLET | Freq: Two times a day (BID) | ORAL | Status: DC

## 2014-02-21 LAB — GC/CHLAMYDIA PROBE AMP
CT Probe RNA: NEGATIVE
GC Probe RNA: NEGATIVE

## 2014-03-03 ENCOUNTER — Emergency Department (HOSPITAL_BASED_OUTPATIENT_CLINIC_OR_DEPARTMENT_OTHER)
Admission: EM | Admit: 2014-03-03 | Discharge: 2014-03-04 | Disposition: A | Discharge status: Home / Self Care | Payer: Medicaid Other | Attending: Emergency Medicine | Admitting: Emergency Medicine

## 2014-03-03 ENCOUNTER — Encounter (HOSPITAL_BASED_OUTPATIENT_CLINIC_OR_DEPARTMENT_OTHER): Payer: Self-pay | Admitting: *Deleted

## 2014-03-03 DIAGNOSIS — Z8659 Personal history of other mental and behavioral disorders: Secondary | ICD-10-CM | POA: Insufficient documentation

## 2014-03-03 DIAGNOSIS — Z791 Long term (current) use of non-steroidal anti-inflammatories (NSAID): Secondary | ICD-10-CM | POA: Insufficient documentation

## 2014-03-03 DIAGNOSIS — Z79899 Other long term (current) drug therapy: Secondary | ICD-10-CM | POA: Insufficient documentation

## 2014-03-03 DIAGNOSIS — K088 Other specified disorders of teeth and supporting structures: Secondary | ICD-10-CM | POA: Diagnosis present

## 2014-03-03 DIAGNOSIS — E039 Hypothyroidism, unspecified: Secondary | ICD-10-CM | POA: Insufficient documentation

## 2014-03-03 DIAGNOSIS — K029 Dental caries, unspecified: Secondary | ICD-10-CM | POA: Diagnosis not present

## 2014-03-03 DIAGNOSIS — Z720 Tobacco use: Secondary | ICD-10-CM | POA: Insufficient documentation

## 2014-03-03 DIAGNOSIS — K219 Gastro-esophageal reflux disease without esophagitis: Secondary | ICD-10-CM | POA: Diagnosis not present

## 2014-03-03 NOTE — ED Notes (Signed)
Dental pain left lower jaw x 2pm today

## 2014-03-04 MED ORDER — PENICILLIN V POTASSIUM 500 MG PO TABS: 500.0000 mg | ORAL_TABLET | Freq: Four times a day (QID) | ORAL | Status: AC

## 2014-03-04 MED ORDER — HYDROCODONE-ACETAMINOPHEN 5-325 MG PO TABS: 1.0000 | ORAL_TABLET | Freq: Four times a day (QID) | ORAL | Status: DC | PRN

## 2014-03-04 NOTE — Discharge Instructions (Signed)

## 2014-03-04 NOTE — ED Provider Notes (Signed)
CSN: [id number]     Arrival date & time 03/03/14  2259 History   First MD Initiated Contact with Patient 03/04/14 0234     Chief Complaint  Patient presents with  . Dental Pain     (Consider location/radiation/quality/duration/timing/severity/associated sxs/prior Treatment) HPI  This is a 23 year old female with about a 12 hour history of pain in her left lower second molar. The pain is sharp and radiating to the left side of her face. She feels as if her face is swollen. Pain is worse with movement of her jaw. It is moderate to severe at its worst. She does not have a dentist.  Past Medical History  Diagnosis Date  . Thyroid disease   . Acid reflux   . Depression   . Hypothyroid    History reviewed. No pertinent past surgical history. History reviewed. No pertinent family history. History  Substance Use Topics  . Smoking status: Current Every Day Smoker -- 0.50 packs/day for 8 years    Types: Cigarettes  . Smokeless tobacco: Not on file  . Alcohol Use: Yes   OB History    No data available     Review of Systems  All other systems reviewed and are negative.   Allergies  Review of patient's allergies indicates no known allergies.  Home Medications   Prior to Admission medications   Medication Sig Start Date End Date Taking? Authorizing Provider  dicyclomine (BENTYL) 20 MG tablet Take 1 tablet (20 mg total) by mouth 2 (two) times daily. 02/18/14   April Smitty Cords, MD  levonorgestrel (MIRENA) 20 MCG/24HR IUD 1 each by Intrauterine route once.      Historical Provider, MD  levothyroxine (SYNTHROID, LEVOTHROID) 100 MCG tablet Take 125 mcg by mouth daily before breakfast.     Historical Provider, MD  naproxen (NAPROSYN) 500 MG tablet Take 1 tablet (500 mg total) by mouth 2 (two) times daily. 02/18/14   April K Palumbo-Rasch, MD  omeprazole (PRILOSEC) 20 MG capsule Take 1 capsule (20 mg total) by mouth daily. 02/18/14   April K Palumbo-Rasch, MD   BP 133/93 mmHg   Pulse 79  Temp(Src) 98.2 F (36.8 C) (Oral)  Resp 18  SpO2 100%  LMP 03/01/2014   Physical Exam  General: Well-developed, well-nourished female in no acute distress; appearance consistent with age of record HENT: normocephalic; atraumatic; carious left lower second molar, tender to percussion Eyes: pupils equal, round and reactive to light; extraocular muscles intact Neck: supple Heart: regular rate and rhythm Lungs: clear to auscultation bilaterally Abdomen: soft; nondistended; nontender; no masses or hepatosplenomegaly; bowel sounds present Extremities: No deformity; full range of motion; pulses normal Neurologic: Awake, alert and oriented; motor function intact in all extremities and symmetric; no facial droop Skin: Warm and dry Psychiatric: Normal mood and affect    ED Course  Procedures (including critical care time)   MDM     Hanley Seamen, MD 03/04/14 0240

## 2014-04-03 ENCOUNTER — Emergency Department (HOSPITAL_BASED_OUTPATIENT_CLINIC_OR_DEPARTMENT_OTHER): Payer: 59

## 2014-04-03 ENCOUNTER — Encounter (HOSPITAL_BASED_OUTPATIENT_CLINIC_OR_DEPARTMENT_OTHER): Payer: Self-pay

## 2014-04-03 ENCOUNTER — Emergency Department (HOSPITAL_BASED_OUTPATIENT_CLINIC_OR_DEPARTMENT_OTHER)
Admission: EM | Admit: 2014-04-03 | Discharge: 2014-04-04 | Disposition: A | Discharge status: Home / Self Care | Payer: 59 | Attending: Emergency Medicine | Admitting: Emergency Medicine

## 2014-04-03 DIAGNOSIS — Z3202 Encounter for pregnancy test, result negative: Secondary | ICD-10-CM | POA: Diagnosis not present

## 2014-04-03 DIAGNOSIS — Z79899 Other long term (current) drug therapy: Secondary | ICD-10-CM | POA: Diagnosis not present

## 2014-04-03 DIAGNOSIS — E039 Hypothyroidism, unspecified: Secondary | ICD-10-CM | POA: Insufficient documentation

## 2014-04-03 DIAGNOSIS — Z8669 Personal history of other diseases of the nervous system and sense organs: Secondary | ICD-10-CM | POA: Diagnosis not present

## 2014-04-03 DIAGNOSIS — K219 Gastro-esophageal reflux disease without esophagitis: Secondary | ICD-10-CM | POA: Insufficient documentation

## 2014-04-03 DIAGNOSIS — R110 Nausea: Secondary | ICD-10-CM | POA: Diagnosis not present

## 2014-04-03 DIAGNOSIS — Z720 Tobacco use: Secondary | ICD-10-CM | POA: Insufficient documentation

## 2014-04-03 DIAGNOSIS — Z791 Long term (current) use of non-steroidal anti-inflammatories (NSAID): Secondary | ICD-10-CM | POA: Diagnosis not present

## 2014-04-03 DIAGNOSIS — N8320 Unspecified ovarian cysts: Secondary | ICD-10-CM | POA: Insufficient documentation

## 2014-04-03 DIAGNOSIS — R102 Pelvic and perineal pain: Secondary | ICD-10-CM | POA: Diagnosis present

## 2014-04-03 DIAGNOSIS — R1031 Right lower quadrant pain: Secondary | ICD-10-CM

## 2014-04-03 DIAGNOSIS — N83201 Unspecified ovarian cyst, right side: Secondary | ICD-10-CM

## 2014-04-03 HISTORY — DX: Unspecified ovarian cyst, unspecified side: N83.209

## 2014-04-03 LAB — URINE MICROSCOPIC-ADD ON

## 2014-04-03 LAB — WET PREP, GENITAL
Clue Cells Wet Prep HPF POC: NONE SEEN
Trich, Wet Prep: NONE SEEN
Yeast Wet Prep HPF POC: NONE SEEN

## 2014-04-03 LAB — BASIC METABOLIC PANEL
ANION GAP: 7 (ref 5–15)
BUN: 10 mg/dL (ref 6–23)
CALCIUM: 9.1 mg/dL (ref 8.4–10.5)
CO2: 26 mmol/L (ref 19–32)
CREATININE: 0.74 mg/dL (ref 0.50–1.10)
Chloride: 103 mEq/L (ref 96–112)
GFR calc non Af Amer: >90 mL/min (ref >90)
Glucose, Bld: 95 mg/dL (ref 70–99)
Potassium: 3.6 mmol/L (ref 3.5–5.1)
Sodium: 136 mmol/L (ref 135–145)

## 2014-04-03 LAB — URINALYSIS, ROUTINE W REFLEX MICROSCOPIC
Bilirubin Urine: NEGATIVE
GLUCOSE, UA: NEGATIVE mg/dL
Ketones, ur: NEGATIVE mg/dL
LEUKOCYTES UA: NEGATIVE
Nitrite: NEGATIVE
Protein, ur: NEGATIVE mg/dL
Specific Gravity, Urine: 1.013 (ref 1.005–1.030)
Urobilinogen, UA: 0.2 mg/dL (ref 0.0–1.0)
pH: 6.5 (ref 5.0–8.0)

## 2014-04-03 LAB — CBC WITH DIFFERENTIAL/PLATELET
BASOS ABS: 0 10*3/uL (ref 0.0–0.1)
Basophils Relative: 0 % (ref 0–1)
Eosinophils Absolute: 0.1 10*3/uL (ref 0.0–0.7)
Eosinophils Relative: 1 % (ref 0–5)
HEMATOCRIT: 40.9 % (ref 36.0–46.0)
HEMOGLOBIN: 13.9 g/dL (ref 12.0–15.0)
Lymphocytes Relative: 36 % (ref 12–46)
Lymphs Abs: 3.7 10*3/uL (ref 0.7–4.0)
MCH: 29.8 pg (ref 26.0–34.0)
MCHC: 34 g/dL (ref 30.0–36.0)
MCV: 87.8 fL (ref 78.0–100.0)
MONO ABS: 0.6 10*3/uL (ref 0.1–1.0)
Monocytes Relative: 6 % (ref 3–12)
NEUTROS ABS: 5.8 10*3/uL (ref 1.7–7.7)
Neutrophils Relative %: 57 % (ref 43–77)
Platelets: 291 10*3/uL (ref 150–400)
RBC: 4.66 MIL/uL (ref 3.87–5.11)
RDW: 11.8 % (ref 11.5–15.5)
WBC: 10.2 10*3/uL (ref 4.0–10.5)

## 2014-04-03 LAB — PREGNANCY, URINE: Preg Test, Ur: NEGATIVE

## 2014-04-03 LAB — SYPHILIS: RPR W/REFLEX TO RPR TITER AND TREPONEMAL ANTIBODIES, TRADITIONAL SCREENING AND DIAGNOSIS ALGORITHM

## 2014-04-03 LAB — HIV ANTIBODY (ROUTINE TESTING W REFLEX): HIV 1&2 Ab, 4th Generation: NONREACTIVE

## 2014-04-03 MED ORDER — IOHEXOL 300 MG/ML  SOLN
100.0000 mL | Freq: Once | INTRAMUSCULAR | Status: AC | PRN
  Administered 2014-04-03: 100 mL via INTRAVENOUS

## 2014-04-03 MED ORDER — HYDROMORPHONE HCL 1 MG/ML IJ SOLN
1.0000 mg | Freq: Once | INTRAMUSCULAR | Status: AC
  Administered 2014-04-03: 1 mg via INTRAVENOUS
  Filled 2014-04-03: qty 1

## 2014-04-03 MED ORDER — SODIUM CHLORIDE 0.9 % IV BOLUS (SEPSIS)
1000.0000 mL | Freq: Once | INTRAVENOUS | Status: AC
  Administered 2014-04-03: 1000 mL via INTRAVENOUS

## 2014-04-03 NOTE — ED Notes (Signed)
Pt with R pelvic pain to right flank, dysuria.

## 2014-04-03 NOTE — ED Provider Notes (Signed)
CSN: [id number]     Arrival date & time 04/03/14  1909 History  This chart was scribed for Mirian Mo, MD by [place] Peace, ED Scribe. The patient was seen in MH01/MH01. The patient's care was started at 7:43 PM.    Chief Complaint  Patient presents with  . Pelvic Pain      Patient is a 24 y.o. female presenting with pelvic pain. The history is provided by the patient. No language interpreter was used.  Pelvic Pain This is a new problem. The current episode started more than 2 days ago. The problem occurs constantly. The problem has not changed since onset.The symptoms are aggravated by coughing, twisting and bending. Nothing relieves the symptoms. She has tried nothing for the symptoms.    HPI Comments: Taylor Donovan is a 24 y.o. female who presents to the Emergency Department complaining of constant radiating right-sided pelvic pain onset 3 days ago that extends into her back and then up to her shoulder blade. She also complains of nausea and dysuria. No complaints of vomiting, cough, congestion, sore throat, or fever. Pt is currently on period. Pt notes that her period was 8 days late and looked as if there was old blood coming out as well. History of ovarian cyst. Pt is current everyday smoker.    Past Medical History  Diagnosis Date  . Thyroid disease   . Acid reflux   . Depression   . Hypothyroid   . Ovarian cyst    History reviewed. No pertinent past surgical history. No family history on file. History  Substance Use Topics  . Smoking status: Current Every Day Smoker -- 0.50 packs/day for 8 years    Types: Cigarettes  . Smokeless tobacco: Not on file  . Alcohol Use: Yes   OB History    No data available     Review of Systems  Constitutional: Negative for fever.  HENT: Negative for congestion and sore throat.   Respiratory: Negative for cough.   Gastrointestinal: Positive for nausea. Negative for vomiting.  Genitourinary: Positive for dysuria and pelvic pain.   All other systems reviewed and are negative.     Allergies  Review of patient's allergies indicates no known allergies.  Home Medications   Prior to Admission medications   Medication Sig Start Date End Date Taking? Authorizing Provider  dicyclomine (BENTYL) 20 MG tablet Take 1 tablet (20 mg total) by mouth 2 (two) times daily. 02/18/14   April K Palumbo-Rasch, MD  HYDROcodone-acetaminophen (NORCO/VICODIN) 5-325 MG per tablet Take 1-2 tablets by mouth every 6 (six) hours as needed (for pain). 03/04/14   Carlisle Beers Molpus, MD  levonorgestrel (MIRENA) 20 MCG/24HR IUD 1 each by Intrauterine route once.      Historical Provider, MD  levothyroxine (SYNTHROID, LEVOTHROID) 100 MCG tablet Take 125 mcg by mouth daily before breakfast.     Historical Provider, MD  naproxen (NAPROSYN) 500 MG tablet Take 1 tablet (500 mg total) by mouth 2 (two) times daily. 02/18/14   April K Palumbo-Rasch, MD  omeprazole (PRILOSEC) 20 MG capsule Take 1 capsule (20 mg total) by mouth daily. 02/18/14   April K Palumbo-Rasch, MD   BP 147/92 mmHg  Pulse 89  Temp(Src) 98.7 F (37.1 C) (Oral)  Resp 18  Ht  (1.702 m)  Wt 215 lb (97.523 kg)  BMI 33.67 kg/m2  SpO2 100%  LMP 04/02/2014 Physical Exam  Constitutional: She is oriented to person, place, and time. She appears well-developed and well-nourished.  HENT:  Head: Normocephalic and atraumatic.  Right Ear: External ear normal.  Left Ear: External ear normal.  Eyes: Conjunctivae and EOM are normal. Pupils are equal, round, and reactive to light.  Neck: Normal range of motion. Neck supple.  Cardiovascular: Normal rate, regular rhythm, normal heart sounds and intact distal pulses.   Pulmonary/Chest: Effort normal and breath sounds normal.  Abdominal: Soft. Bowel sounds are normal. There is tenderness in the right lower quadrant.  Genitourinary: Cervix exhibits discharge (blood). Cervix exhibits no motion tenderness and no friability. Right adnexum displays no  tenderness and no fullness. Left adnexum displays no tenderness and no fullness.  Musculoskeletal: Normal range of motion.  Neurological: She is alert and oriented to person, place, and time.  Skin: Skin is warm and dry.  Vitals reviewed.   ED Course  Procedures (including critical care time) Labs Review Labs Reviewed  WET PREP, GENITAL - Abnormal; Notable for the following:    WBC, Wet Prep HPF POC FEW (*)    All other components within normal limits  URINALYSIS, ROUTINE W REFLEX MICROSCOPIC - Abnormal; Notable for the following:    Hgb urine dipstick SMALL (*)    All other components within normal limits  GC/CHLAMYDIA PROBE AMP  PREGNANCY, URINE  RPR  HIV ANTIBODY (ROUTINE TESTING)  CBC WITH DIFFERENTIAL  BASIC METABOLIC PANEL  URINE MICROSCOPIC-ADD ON    Imaging Review Ct Abdomen Pelvis W Contrast  04/03/2014   CLINICAL DATA:  Right lower quadrant abdominal pain. Right flank pain.  EXAM: CT ABDOMEN AND PELVIS WITH CONTRAST  TECHNIQUE: Multidetector CT imaging of the abdomen and pelvis was performed using the standard protocol following bolus administration of intravenous contrast.  CONTRAST:  OMNIPAQUE IOHEXOL 300 MG/ML  SOLN  COMPARISON:  CT 01/02/2014  FINDINGS: The included lung bases are clear.  The liver, spleen, pancreas, kidneys, adrenal glands, and gallbladder appear normal. There are no dilated or thickened bowel loops. No free air, free fluid, or intra-abdominal fluid collection.  The appendix is normal.  The abdominal aorta is normal in caliber. There is no retroperitoneal adenopathy.  The previous right ovarian cyst has diminished in size (previously 5.5 cm), residual prominent right ovarian follicle measures 2.6 cm. T-shaped intrauterine device is again abnormal in position, unchanged, long stem directed to the right cornua. Left ovary appears normal in size. Urinary bladder is physiologically distended. There is no pelvic free fluid.  No acute osseous abnormalities.   IMPRESSION: 1. Normal appendix.  No acute abnormality. 2. The intrauterine device again appears malpositioned, long stem directed to the right cornua, this is unchanged compared to prior CT. 3. Diminished size of right ovarian cyst, previously 5.5 cm, prominent follicle remains measuring 2.6 cm.   Electronically Signed   By: Rubye Oaks M.D.   On: 04/03/2014 21:14     EKG Interpretation None     Medications  sodium chloride 0.9 % bolus 1,000 mL (0 mLs Intravenous Stopped 04/03/14 2255)  HYDROmorphone (DILAUDID) injection 1 mg (1 mg Intravenous Given 04/03/14 2025)  iohexol (OMNIPAQUE) 300 MG/ML solution 100 mL (100 mLs Intravenous Contrast Given 04/03/14 2044)  HYDROmorphone (DILAUDID) injection 1 mg (1 mg Intravenous Given 04/03/14 2341)    7:47 PM- Treatment plan was discussed with patient who verbalizes understanding and agrees.   MDM   Final diagnoses:  RLQ abdominal pain  Cyst of right ovary    24 y.o. female with pertinent PMH of R ovarian cyst presents with recurrent right lower quadrant pain. She denies vaginal  discharge, however is currently on her menstrual period.  She denies fever, has had some nausea, no other GI symptoms. On arrival vital signs physical exam as above. As pain was primarily right lower quadrant, CT scan obtained and unremarkable for appendicitis, but did demonstrate a right ovarian cyst. Pelvic exam unremarkable. Likely etiology ovarian cyst. Discharged home in stable condition with standard return precautions.    I have reviewed all laboratory and imaging studies if ordered as above  1. Cyst of right ovary   2. RLQ abdominal pain       I personally performed the services described in this documentation, which was scribed in my presence. The recorded information has been reviewed and is accurate.   Mirian Mo, MD 04/04/14 [phone]

## 2014-04-03 NOTE — Discharge Instructions (Signed)
Abdominal Pain, Women °Abdominal (stomach, pelvic, or belly) pain can be caused by many things. It is important to tell your doctor: °· The location of the pain. °· Does it come and go or is it present all the time? °· Are there things that start the pain (eating certain foods, exercise)? °· Are there other symptoms associated with the pain (fever, nausea, vomiting, diarrhea)? °All of this is helpful to know when trying to find the cause of the pain. °CAUSES  °· Stomach: virus or bacteria infection, or ulcer. °· Intestine: appendicitis (inflamed appendix), regional ileitis (Crohn's disease), ulcerative colitis (inflamed colon), irritable bowel syndrome, diverticulitis (inflamed diverticulum of the colon), or cancer of the stomach or intestine. °· Gallbladder disease or stones in the gallbladder. °· Kidney disease, kidney stones, or infection. °· Pancreas infection or cancer. °· Fibromyalgia (pain disorder). °· Diseases of the female organs: °¨ Uterus: fibroid (non-cancerous) tumors or infection. °¨ Fallopian tubes: infection or tubal pregnancy. °¨ Ovary: cysts or tumors. °¨ Pelvic adhesions (scar tissue). °¨ Endometriosis (uterus lining tissue growing in the pelvis and on the pelvic organs). °¨ Pelvic congestion syndrome (female organs filling up with blood just before the menstrual period). °¨ Pain with the menstrual period. °¨ Pain with ovulation (producing an egg). °¨ Pain with an IUD (intrauterine device, birth control) in the uterus. °¨ Cancer of the female organs. °· Functional pain (pain not caused by a disease, may improve without treatment). °· Psychological pain. °· Depression. °DIAGNOSIS  °Your doctor will decide the seriousness of your pain by doing an examination. °· Blood tests. °· X-rays. °· Ultrasound. °· CT scan (computed tomography, special type of X-ray). °· MRI (magnetic resonance imaging). °· Cultures, for infection. °· Barium enema (dye inserted in the large intestine, to better view it with  X-rays). °· Colonoscopy (looking in intestine with a lighted tube). °· Laparoscopy (minor surgery, looking in abdomen with a lighted tube). °· Major abdominal exploratory surgery (looking in abdomen with a large incision). °TREATMENT  °The treatment will depend on the cause of the pain.  °· Many cases can be observed and treated at home. °· Over-the-counter medicines recommended by your caregiver. °· Prescription medicine. °· Antibiotics, for infection. °· Birth control pills, for painful periods or for ovulation pain. °· Hormone treatment, for endometriosis. °· Nerve blocking injections. °· Physical therapy. °· Antidepressants. °· Counseling with a psychologist or psychiatrist. °· Minor or major surgery. °HOME CARE INSTRUCTIONS  °· Do not take laxatives, unless directed by your caregiver. °· Take over-the-counter pain medicine only if ordered by your caregiver. Do not take aspirin because it can cause an upset stomach or bleeding. °· Try a clear liquid diet (broth or water) as ordered by your caregiver. Slowly move to a bland diet, as tolerated, if the pain is related to the stomach or intestine. °· Have a thermometer and take your temperature several times a day, and record it. °· Bed rest and sleep, if it helps the pain. °· Avoid sexual intercourse, if it causes pain. °· Avoid stressful situations. °· Keep your follow-up appointments and tests, as your caregiver orders. °· If the pain does not go away with medicine or surgery, you may try: °¨ Acupuncture. °¨ Relaxation exercises (yoga, meditation). °¨ Group therapy. °¨ Counseling. °SEEK MEDICAL CARE IF:  °· You notice certain foods cause stomach pain. °· Your home care treatment is not helping your pain. °· You need stronger pain medicine. °· You want your IUD removed. °· You feel faint or   lightheaded. °· You develop nausea and vomiting. °· You develop a rash. °· You are having side effects or an allergy to your medicine. °SEEK IMMEDIATE MEDICAL CARE IF:  °· Your  pain does not go away or gets worse. °· You have a fever. °· Your pain is felt only in portions of the abdomen. The right side could possibly be appendicitis. The left lower portion of the abdomen could be colitis or diverticulitis. °· You are passing blood in your stools (bright red or black tarry stools, with or without vomiting). °· You have blood in your urine. °· You develop chills, with or without a fever. °· You pass out. °MAKE SURE YOU:  °· Understand these instructions. °· Will watch your condition. °· Will get help right away if you are not doing well or get worse. °Document Released: 01/12/2007 Document Revised: 08/01/2013 Document Reviewed: 02/01/2009 °[employer]® Patient Information ©2015 [employer]. This information is not intended to replace advice given to you by your health care provider. Make sure you discuss any questions you have with your health care provider. ° °

## 2014-04-05 LAB — GC/CHLAMYDIA PROBE AMP
CT Probe RNA: NEGATIVE
GC Probe RNA: NEGATIVE

## 2014-08-15 ENCOUNTER — Encounter (HOSPITAL_BASED_OUTPATIENT_CLINIC_OR_DEPARTMENT_OTHER): Payer: Self-pay | Admitting: *Deleted

## 2014-08-15 ENCOUNTER — Emergency Department (HOSPITAL_BASED_OUTPATIENT_CLINIC_OR_DEPARTMENT_OTHER)
Admission: EM | Admit: 2014-08-15 | Discharge: 2014-08-15 | Disposition: A | Discharge status: Home / Self Care | Payer: 59 | Attending: Emergency Medicine | Admitting: Emergency Medicine

## 2014-08-15 ENCOUNTER — Emergency Department (HOSPITAL_BASED_OUTPATIENT_CLINIC_OR_DEPARTMENT_OTHER): Payer: 59

## 2014-08-15 DIAGNOSIS — Z791 Long term (current) use of non-steroidal anti-inflammatories (NSAID): Secondary | ICD-10-CM | POA: Insufficient documentation

## 2014-08-15 DIAGNOSIS — K219 Gastro-esophageal reflux disease without esophagitis: Secondary | ICD-10-CM | POA: Insufficient documentation

## 2014-08-15 DIAGNOSIS — Z79899 Other long term (current) drug therapy: Secondary | ICD-10-CM | POA: Insufficient documentation

## 2014-08-15 DIAGNOSIS — Z8742 Personal history of other diseases of the female genital tract: Secondary | ICD-10-CM | POA: Insufficient documentation

## 2014-08-15 DIAGNOSIS — Z8659 Personal history of other mental and behavioral disorders: Secondary | ICD-10-CM | POA: Insufficient documentation

## 2014-08-15 DIAGNOSIS — R110 Nausea: Secondary | ICD-10-CM | POA: Diagnosis not present

## 2014-08-15 DIAGNOSIS — E039 Hypothyroidism, unspecified: Secondary | ICD-10-CM | POA: Diagnosis not present

## 2014-08-15 DIAGNOSIS — R079 Chest pain, unspecified: Secondary | ICD-10-CM | POA: Diagnosis present

## 2014-08-15 DIAGNOSIS — Z720 Tobacco use: Secondary | ICD-10-CM | POA: Diagnosis not present

## 2014-08-15 DIAGNOSIS — M940 Chondrocostal junction syndrome [Tietze]: Secondary | ICD-10-CM | POA: Insufficient documentation

## 2014-08-15 MED ORDER — NAPROXEN 500 MG PO TABS: 500.0000 mg | ORAL_TABLET | Freq: Two times a day (BID) | ORAL | Status: DC

## 2014-08-15 MED ORDER — KETOROLAC TROMETHAMINE 60 MG/2ML IM SOLN
60.0000 mg | Freq: Once | INTRAMUSCULAR | Status: AC
  Administered 2014-08-15: 60 mg via INTRAMUSCULAR
  Filled 2014-08-15: qty 2

## 2014-08-15 NOTE — ED Provider Notes (Signed)
CSN: [id number]     Arrival date & time 08/15/14  1009 History   First MD Initiated Contact with Patient 08/15/14 1122     Chief Complaint  Patient presents with  . Pleurisy     (Consider location/radiation/quality/duration/timing/severity/associated sxs/prior Treatment) Patient is a 24 y.o. female presenting with chest pain.  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 Associated symptoms: nausea   Associated symptoms: no abdominal pain, no diaphoresis, no fever, no shortness of breath (pain with deep breaths), no syncope and not vomiting     Past Medical History  Diagnosis Date  . Thyroid disease   . Acid reflux   . Depression   . Hypothyroid   . Ovarian cyst    History reviewed. No pertinent past surgical history. History reviewed. No pertinent family history. History  Substance Use Topics  . Smoking status: Current Every Day Smoker -- 0.50 packs/day for 8 years    Types: Cigarettes  . Smokeless tobacco: Not on file  . Alcohol Use: Yes   OB History    No data available     Review of Systems  Constitutional: Negative for fever and diaphoresis.  Respiratory: Negative for shortness of breath (pain with deep breaths).   Cardiovascular: Positive for chest pain. Negative for syncope.  Gastrointestinal: Positive for nausea. Negative for vomiting and abdominal pain.  All other systems reviewed and are negative.     Allergies  Review of patient's allergies indicates no known allergies.  Home Medications   Prior to Admission medications   Medication Sig Start Date End Date Taking? Authorizing Provider  dicyclomine (BENTYL) 20 MG  tablet Take 1 tablet (20 mg total) by mouth 2 (two) times daily. 02/18/14   April Palumbo, MD  HYDROcodone-acetaminophen (NORCO/VICODIN) 5-325 MG per tablet Take 1-2 tablets by mouth every 6 (six) hours as needed (for pain). 03/04/14   Paula Libra, MD  levonorgestrel (MIRENA) 20 MCG/24HR IUD 1 each by Intrauterine route once.      Historical Provider, MD  levothyroxine (SYNTHROID, LEVOTHROID) 100 MCG tablet Take 125 mcg by mouth daily before breakfast.     Historical Provider, MD  naproxen (NAPROSYN) 500 MG tablet Take 1 tablet (500 mg total) by mouth 2 (two) times daily. 02/18/14   April Palumbo, MD  omeprazole (PRILOSEC) 20 MG capsule Take 1 capsule (20 mg total) by mouth daily. 02/18/14   April Palumbo, MD   BP 149/87 mmHg  Pulse 90  Temp(Src) 98.4 F (36.9 C) (Oral)  Resp 16  Ht  (1.702 m)  Wt 215 lb (97.523 kg)  BMI 33.67 kg/m2  SpO2 100%  LMP 08/07/2014 Physical Exam  Constitutional: She is oriented to person, place, and time. She appears well-developed and well-nourished. No distress.  HENT:  Head: Normocephalic and atraumatic.  Mouth/Throat: Oropharynx is clear and moist.  Eyes: Conjunctivae are normal. Pupils are equal, round, and reactive to light. No scleral icterus.  Neck: Neck supple.  Cardiovascular: Normal rate, regular rhythm, normal heart sounds and intact distal pulses.   No murmur heard. Pulmonary/Chest: Effort normal and breath sounds normal. No stridor. No respiratory distress. She has no rales.    Abdominal: Soft. Bowel  sounds are normal. She exhibits no distension. There is no tenderness.  Musculoskeletal: Normal range of motion.  Neurological: She is alert and oriented to person, place, and time.  Skin: Skin is warm and dry. No rash noted.  Psychiatric: She has a normal mood and affect. Her behavior is normal.  Nursing note and vitals reviewed.   ED Course  Procedures (including critical care time) Labs Review Labs Reviewed - No data to  display  Imaging Review Dg Chest 2 View  08/15/2014   CLINICAL DATA:  Chest pain, right shoulder pain for 1 week  EXAM: CHEST  2 VIEW  COMPARISON:  02/17/2014  FINDINGS: Cardiomediastinal silhouette is stable. No acute infiltrate or pleural effusion. No pulmonary edema. Bony thorax is unremarkable.  IMPRESSION: No active cardiopulmonary disease.   Electronically Signed   By: Natasha Mead M.D.   On: 08/15/2014 10:47     EKG Interpretation   Date/Time:  Tuesday Aug 15 2014 10:16:37 EDT Ventricular Rate:  93 PR Interval:  138 QRS Duration: 84 QT Interval:  352 QTC Calculation: 437 R Axis:   68 Text Interpretation:  Normal sinus rhythm Normal ECG No significant change  was found Confirmed by [facility]  MD, TREY ([id number]) on 08/15/2014 12:13:11 PM      MDM   Final diagnoses:  Costochondritis    24 yo female with 10 days of anterior chest pain.  Exam is convincingly consistent with MSK chest wall pain/costochondritis.  PE unlikely and she is PERC negative.  Plan DC with NSAIDs, follow up.     Blake Divine, MD 08/15/14 [phone]

## 2014-08-15 NOTE — ED Notes (Signed)
Pt reports a week and a half of mid sternal chest pain, chest is tender to touch, pain increases with deep inspiration, movement of arms and postitioning. Denies any sob, nausea or any other c/o, ekg done while pt being triaged.

## 2014-08-15 NOTE — Discharge Instructions (Signed)

## 2014-11-13 ENCOUNTER — Emergency Department (HOSPITAL_BASED_OUTPATIENT_CLINIC_OR_DEPARTMENT_OTHER)
Admission: EM | Admit: 2014-11-13 | Discharge: 2014-11-13 | Disposition: A | Discharge status: Home / Self Care | Payer: 59 | Attending: Emergency Medicine | Admitting: Emergency Medicine

## 2014-11-13 ENCOUNTER — Encounter (HOSPITAL_BASED_OUTPATIENT_CLINIC_OR_DEPARTMENT_OTHER): Payer: Self-pay | Admitting: Emergency Medicine

## 2014-11-13 DIAGNOSIS — S30860A Insect bite (nonvenomous) of lower back and pelvis, initial encounter: Secondary | ICD-10-CM | POA: Insufficient documentation

## 2014-11-13 DIAGNOSIS — K219 Gastro-esophageal reflux disease without esophagitis: Secondary | ICD-10-CM | POA: Insufficient documentation

## 2014-11-13 DIAGNOSIS — E079 Disorder of thyroid, unspecified: Secondary | ICD-10-CM | POA: Insufficient documentation

## 2014-11-13 DIAGNOSIS — Y998 Other external cause status: Secondary | ICD-10-CM | POA: Insufficient documentation

## 2014-11-13 DIAGNOSIS — Y9289 Other specified places as the place of occurrence of the external cause: Secondary | ICD-10-CM | POA: Insufficient documentation

## 2014-11-13 DIAGNOSIS — Z720 Tobacco use: Secondary | ICD-10-CM | POA: Insufficient documentation

## 2014-11-13 DIAGNOSIS — W57XXXA Bitten or stung by nonvenomous insect and other nonvenomous arthropods, initial encounter: Secondary | ICD-10-CM | POA: Insufficient documentation

## 2014-11-13 DIAGNOSIS — E039 Hypothyroidism, unspecified: Secondary | ICD-10-CM | POA: Insufficient documentation

## 2014-11-13 DIAGNOSIS — Z791 Long term (current) use of non-steroidal anti-inflammatories (NSAID): Secondary | ICD-10-CM | POA: Insufficient documentation

## 2014-11-13 DIAGNOSIS — Z8742 Personal history of other diseases of the female genital tract: Secondary | ICD-10-CM | POA: Insufficient documentation

## 2014-11-13 DIAGNOSIS — Z79899 Other long term (current) drug therapy: Secondary | ICD-10-CM | POA: Insufficient documentation

## 2014-11-13 DIAGNOSIS — Y9389 Activity, other specified: Secondary | ICD-10-CM | POA: Insufficient documentation

## 2014-11-13 NOTE — ED Notes (Signed)
Bite to left buttock

## 2014-11-13 NOTE — ED Provider Notes (Signed)
CSN: [id number]     Arrival date & time 11/13/14  1191 History   First MD Initiated Contact with Patient 11/13/14 [phone]     Chief Complaint  Patient presents with  . Insect Bite     (Consider location/radiation/quality/duration/timing/severity/associated sxs/prior Treatment) HPI Patient noted reddened area on left buttock approximately 12 hours ago, which is slightly sore. Pain worse with touching area. Improved with no weightbearing She thinks she was bitten by an insect. Pain is mild, nonradiating. No fever or other associated other symptoms. No treatment prior to coming here Past Medical History  Diagnosis Date  . Thyroid disease   . Acid reflux   . Depression   . Hypothyroid   . Ovarian cyst    History reviewed. No pertinent past surgical history. No family history on file. Social History  Substance Use Topics  . Smoking status: Current Every Day Smoker -- 0.50 packs/day for 8 years    Types: Cigarettes  . Smokeless tobacco: None  . Alcohol Use: Yes   OB History    No data available     Review of Systems  Constitutional: Negative.   Skin: Positive for wound.       Reddened area left  Allergic/Immunologic: Negative.        Up to date on tetanus immunization      Allergies  Review of patient's allergies indicates no known allergies.  Home Medications   Prior to Admission medications   Medication Sig Start Date End Date Taking? Authorizing Provider  dicyclomine (BENTYL) 20 MG tablet Take 1 tablet (20 mg total) by mouth 2 (two) times daily. 02/18/14   April Palumbo, MD  HYDROcodone-acetaminophen (NORCO/VICODIN) 5-325 MG per tablet Take 1-2 tablets by mouth every 6 (six) hours as needed (for pain). 03/04/14   Paula Libra, MD  levonorgestrel (MIRENA) 20 MCG/24HR IUD 1 each by Intrauterine route once.      Historical Provider, MD  levothyroxine (SYNTHROID, LEVOTHROID) 100 MCG tablet Take 125 mcg by mouth daily before breakfast.     Historical Provider, MD  naproxen  (NAPROSYN) 500 MG tablet Take 1 tablet (500 mg total) by mouth 2 (two) times daily with a meal. 08/15/14   Blake Divine, MD  omeprazole (PRILOSEC) 20 MG capsule Take 1 capsule (20 mg total) by mouth daily. 02/18/14   April Palumbo, MD   BP 135/96 mmHg  Pulse 92  Temp(Src) 98.4 F (36.9 C) (Oral)  Resp 16  Ht  (1.702 m)  Wt 200 lb (90.719 kg)  BMI 31.32 kg/m2  SpO2 100% Physical Exam  Constitutional: She is oriented to person, place, and time. She appears well-developed and well-nourished. No distress.  HENT:  Head: Normocephalic and atraumatic.  Eyes: EOM are normal.  Neck: Neck supple.  Cardiovascular: Normal rate.   Pulmonary/Chest: Effort normal.  Abdominal:  Obese  Musculoskeletal: Normal range of motion. She exhibits no edema.  Neurological: She is oriented to person, place, and time.  Skin:  2 cm reddened area at left buttock upper lateral aspect. No stinger present. There is a 0.5 mm pustule at Center of the lesion. No fluctuance. Minimally tender.  Nursing note and vitals reviewed.   ED Course  Procedures (including critical care time) Labs Review Labs Reviewed - No data to display  Imaging Review No results found. I, Doug Sou, personally reviewed and evaluated these images and lab results as part of my medical decision-making.   EKG Interpretation None      MDM  Plan warm compresses,  expectant management. Return if worse or see PMD Final diagnoses:  Bug bite   Diagnoses bug bite     Doug Sou, MD 11/13/14 [phone]

## 2014-11-13 NOTE — Discharge Instructions (Signed)
Sit in warm bathtub or let warm water from the shower and hit reddened area on left buttock 4 times daily for 30 minutes at a time. Take Tylenol or Advil as needed for pain. Return if area worsens or enlarges within the next 2-3 days or see your primary care doctor. Or return sooner if you feel worse for any reason.

## 2015-01-16 ENCOUNTER — Encounter (HOSPITAL_BASED_OUTPATIENT_CLINIC_OR_DEPARTMENT_OTHER): Payer: Self-pay | Admitting: Emergency Medicine

## 2015-01-16 ENCOUNTER — Emergency Department (HOSPITAL_BASED_OUTPATIENT_CLINIC_OR_DEPARTMENT_OTHER)
Admission: EM | Admit: 2015-01-16 | Discharge: 2015-01-16 | Disposition: A | Discharge status: Home / Self Care | Payer: 59 | Attending: Emergency Medicine | Admitting: Emergency Medicine

## 2015-01-16 DIAGNOSIS — Z8639 Personal history of other endocrine, nutritional and metabolic disease: Secondary | ICD-10-CM | POA: Insufficient documentation

## 2015-01-16 DIAGNOSIS — W57XXXA Bitten or stung by nonvenomous insect and other nonvenomous arthropods, initial encounter: Secondary | ICD-10-CM | POA: Insufficient documentation

## 2015-01-16 DIAGNOSIS — Y929 Unspecified place or not applicable: Secondary | ICD-10-CM | POA: Insufficient documentation

## 2015-01-16 DIAGNOSIS — Z8742 Personal history of other diseases of the female genital tract: Secondary | ICD-10-CM | POA: Insufficient documentation

## 2015-01-16 DIAGNOSIS — Z720 Tobacco use: Secondary | ICD-10-CM | POA: Insufficient documentation

## 2015-01-16 DIAGNOSIS — L089 Local infection of the skin and subcutaneous tissue, unspecified: Secondary | ICD-10-CM | POA: Insufficient documentation

## 2015-01-16 DIAGNOSIS — Z791 Long term (current) use of non-steroidal anti-inflammatories (NSAID): Secondary | ICD-10-CM | POA: Insufficient documentation

## 2015-01-16 DIAGNOSIS — Z8719 Personal history of other diseases of the digestive system: Secondary | ICD-10-CM | POA: Insufficient documentation

## 2015-01-16 DIAGNOSIS — Y939 Activity, unspecified: Secondary | ICD-10-CM | POA: Insufficient documentation

## 2015-01-16 DIAGNOSIS — Y999 Unspecified external cause status: Secondary | ICD-10-CM | POA: Insufficient documentation

## 2015-01-16 DIAGNOSIS — Z8659 Personal history of other mental and behavioral disorders: Secondary | ICD-10-CM | POA: Insufficient documentation

## 2015-01-16 MED ORDER — MUPIROCIN CALCIUM 2 % NA OINT: TOPICAL_OINTMENT | NASAL | Status: DC

## 2015-01-16 MED ORDER — DOXYCYCLINE HYCLATE 100 MG PO CAPS: 100.0000 mg | ORAL_CAPSULE | Freq: Two times a day (BID) | ORAL | Status: DC

## 2015-01-16 NOTE — ED Provider Notes (Signed)
CSN: [id number]     Arrival date & time 01/16/15  1452 History   First MD Initiated Contact with Patient 01/16/15 1534     Chief Complaint  Patient presents with  . Insect Bite      HPI Pt with red, swollen and painful area to left lateral knee that developed last night, no streaking Past Medical History  Diagnosis Date  . Thyroid disease   . Acid reflux   . Depression   . Hypothyroid   . Ovarian cyst    History reviewed. No pertinent past surgical history. History reviewed. No pertinent family history. Social History  Substance Use Topics  . Smoking status: Current Every Day Smoker -- 0.50 packs/day for 8 years    Types: Cigarettes  . Smokeless tobacco: None  . Alcohol Use: Yes   OB History    No data available     Review of Systems  All other systems reviewed and are negative  Allergies  Review of patient's allergies indicates no known allergies.  Home Medications   Prior to Admission medications   Medication Sig Start Date End Date Taking? Authorizing Provider  doxycycline (VIBRAMYCIN) 100 MG capsule Take 1 capsule (100 mg total) by mouth 2 (two) times daily. 01/16/15   Nelva Nay, MD  levonorgestrel (MIRENA) 20 MCG/24HR IUD 1 each by Intrauterine route once.      Historical Provider, MD  mupirocin nasal ointment (BACTROBAN) 2 % Apply in each nostril daily 01/16/15   Nelva Nay, MD  naproxen (NAPROSYN) 500 MG tablet Take 1 tablet (500 mg total) by mouth 2 (two) times daily with a meal. 08/15/14   Blake Divine, MD   BP 142/84 mmHg  Pulse 98  Temp(Src) 98.2 F (36.8 C) (Oral)  Resp 20  Ht  (1.702 m)  Wt 235 lb (106.595 kg)  BMI 36.80 kg/m2  SpO2 99%  LMP 01/09/2015 Physical Exam Physical Exam  Nursing note and vitals reviewed. Constitutional: She is oriented to person, place, and time. She appears well-developed and well-nourished. No distress.  HENT:  Head: Normocephalic and atraumatic.  Eyes: Pupils are equal, round, and reactive to light.   Neck: Normal range of motion.  Cardiovascular: Normal rate and intact distal pulses.   Pulmonary/Chest: No respiratory distress.  Abdominal: Normal appearance. She exhibits no distension.  Musculoskeletal: Normal range of motion.  erythematous area of with central area of draining pustule. Neurological: She is alert and oriented to person, place, and time. No cranial nerve deficit.  Skin: Skin is warm and dry. No rash noted.  Psychiatric: She has a normal mood and affect. Her behavior is normal.   ED Course  Procedures (including critical care time) Labs Review Labs Reviewed  WOUND CULTURE    Imaging Review No results found. I have personally reviewed and evaluated these images and lab results as part of my medical decision-making.    MDM   Final diagnoses:  Skin infection        Nelva Nay, MD 01/16/15 1556

## 2015-01-16 NOTE — ED Notes (Signed)
Pt with red, swollen and painful area to left lateral knee that developed last night, no streaking

## 2015-01-16 NOTE — Discharge Instructions (Signed)
Cellulitis °Cellulitis is an infection of the skin and the tissue under the skin. The infected area is usually red and tender. This happens most often in the arms and lower legs. °HOME CARE  °· Take your antibiotic medicine as told. Finish the medicine even if you start to feel better. °· Keep the infected arm or leg raised (elevated). °· Put a warm cloth on the area up to 4 times per day. °· Only take medicines as told by your doctor. °· Keep all doctor visits as told. °GET HELP IF: °· You see red streaks on the skin coming from the infected area. °· Your red area gets bigger or turns a dark color. °· Your bone or joint under the infected area is painful after the skin heals. °· Your infection comes back in the same area or different area. °· You have a puffy (swollen) bump in the infected area. °· You have new symptoms. °· You have a fever. °GET HELP RIGHT AWAY IF:  °· You feel very sleepy. °· You throw up (vomit) or have watery poop (diarrhea). °· You feel sick and have muscle aches and pains. °  °This information is not intended to replace advice given to you by your health care provider. Make sure you discuss any questions you have with your health care provider. °  °Document Released: 09/03/2007 Document Revised: 12/06/2014 Document Reviewed: 06/02/2011 °Elsevier Interactive Patient Education ©2016 [employer] ° °

## 2015-01-16 NOTE — ED Notes (Signed)
MD at bedside. 

## 2015-01-18 ENCOUNTER — Telehealth (HOSPITAL_BASED_OUTPATIENT_CLINIC_OR_DEPARTMENT_OTHER): Payer: Self-pay | Admitting: Emergency Medicine

## 2015-01-19 LAB — WOUND CULTURE
CULTURE: NO GROWTH
Gram Stain: NONE SEEN
Special Requests: NORMAL

## 2015-04-29 ENCOUNTER — Encounter (HOSPITAL_COMMUNITY): Payer: Self-pay | Admitting: *Deleted

## 2015-04-29 ENCOUNTER — Emergency Department (HOSPITAL_COMMUNITY)
Admission: EM | Admit: 2015-04-29 | Discharge: 2015-04-29 | Disposition: A | Discharge status: Home / Self Care | Payer: Medicaid Other | Attending: Emergency Medicine | Admitting: Emergency Medicine

## 2015-04-29 ENCOUNTER — Emergency Department (HOSPITAL_COMMUNITY): Payer: Medicaid Other

## 2015-04-29 DIAGNOSIS — Z8719 Personal history of other diseases of the digestive system: Secondary | ICD-10-CM | POA: Insufficient documentation

## 2015-04-29 DIAGNOSIS — Z3202 Encounter for pregnancy test, result negative: Secondary | ICD-10-CM | POA: Insufficient documentation

## 2015-04-29 DIAGNOSIS — Z8659 Personal history of other mental and behavioral disorders: Secondary | ICD-10-CM | POA: Insufficient documentation

## 2015-04-29 DIAGNOSIS — R102 Pelvic and perineal pain: Secondary | ICD-10-CM

## 2015-04-29 DIAGNOSIS — R1030 Lower abdominal pain, unspecified: Secondary | ICD-10-CM

## 2015-04-29 DIAGNOSIS — E039 Hypothyroidism, unspecified: Secondary | ICD-10-CM | POA: Insufficient documentation

## 2015-04-29 DIAGNOSIS — F17210 Nicotine dependence, cigarettes, uncomplicated: Secondary | ICD-10-CM | POA: Insufficient documentation

## 2015-04-29 DIAGNOSIS — N83209 Unspecified ovarian cyst, unspecified side: Secondary | ICD-10-CM | POA: Insufficient documentation

## 2015-04-29 LAB — WET PREP, GENITAL
CLUE CELLS WET PREP: NONE SEEN
Sperm: NONE SEEN
Trich, Wet Prep: NONE SEEN
Yeast Wet Prep HPF POC: NONE SEEN

## 2015-04-29 LAB — BASIC METABOLIC PANEL
Anion gap: 10 (ref 5–15)
BUN: 9 mg/dL (ref 6–20)
CALCIUM: 9.2 mg/dL (ref 8.9–10.3)
CO2: 24 mmol/L (ref 22–32)
CREATININE: 0.67 mg/dL (ref 0.44–1.00)
Chloride: 105 mmol/L (ref 101–111)
GFR calc Af Amer: >60 mL/min (ref >60)
GLUCOSE: 107 mg/dL — AB (ref 65–99)
Potassium: 4.1 mmol/L (ref 3.5–5.1)
Sodium: 139 mmol/L (ref 135–145)

## 2015-04-29 LAB — URINALYSIS, ROUTINE W REFLEX MICROSCOPIC
BILIRUBIN URINE: NEGATIVE
GLUCOSE, UA: NEGATIVE mg/dL
KETONES UR: NEGATIVE mg/dL
NITRITE: NEGATIVE
PH: 6 (ref 5.0–8.0)
Protein, ur: NEGATIVE mg/dL
Specific Gravity, Urine: 1.013 (ref 1.005–1.030)

## 2015-04-29 LAB — CBC WITH DIFFERENTIAL/PLATELET
Basophils Absolute: 0 10*3/uL (ref 0.0–0.1)
Basophils Relative: 0 %
EOS PCT: 2 %
Eosinophils Absolute: 0.1 10*3/uL (ref 0.0–0.7)
HEMATOCRIT: 38 % (ref 36.0–46.0)
Hemoglobin: 13 g/dL (ref 12.0–15.0)
LYMPHS PCT: 33 %
Lymphs Abs: 3 10*3/uL (ref 0.7–4.0)
MCH: 30.6 pg (ref 26.0–34.0)
MCHC: 34.2 g/dL (ref 30.0–36.0)
MCV: 89.4 fL (ref 78.0–100.0)
MONO ABS: 0.6 10*3/uL (ref 0.1–1.0)
MONOS PCT: 6 %
NEUTROS ABS: 5.3 10*3/uL (ref 1.7–7.7)
Neutrophils Relative %: 59 %
PLATELETS: 263 10*3/uL (ref 150–400)
RBC: 4.25 MIL/uL (ref 3.87–5.11)
RDW: 12.1 % (ref 11.5–15.5)
WBC: 8.9 10*3/uL (ref 4.0–10.5)

## 2015-04-29 LAB — URINE MICROSCOPIC-ADD ON

## 2015-04-29 LAB — I-STAT BETA HCG BLOOD, ED (MC, WL, AP ONLY)

## 2015-04-29 MED ORDER — IBUPROFEN 800 MG PO TABS: 800.0000 mg | ORAL_TABLET | Freq: Three times a day (TID) | ORAL | Status: DC

## 2015-04-29 NOTE — ED Notes (Signed)
Patient was alert, oriented and stable upon discharge. RN went over AVS and patient had no further questions.  

## 2015-04-29 NOTE — Discharge Instructions (Signed)
Abdominal Pain, Adult °Many things can cause abdominal pain. Usually, abdominal pain is not caused by a disease and will improve without treatment. It can often be observed and treated at home. Your health care provider will do a physical exam and possibly order blood tests and X-rays to help determine the seriousness of your pain. However, in many cases, more time must pass before a clear cause of the pain can be found. Before that point, your health care provider may not know if you need more testing or further treatment. °HOME CARE INSTRUCTIONS °Monitor your abdominal pain for any changes. The following actions may help to alleviate any discomfort you are experiencing: °· Only take over-the-counter or prescription medicines as directed by your health care provider. °· Do not take laxatives unless directed to do so by your health care provider. °· Try a clear liquid diet (broth, tea, or water) as directed by your health care provider. Slowly move to a bland diet as tolerated. °SEEK MEDICAL CARE IF: °· You have unexplained abdominal pain. °· You have abdominal pain associated with nausea or diarrhea. °· You have pain when you urinate or have a bowel movement. °· You experience abdominal pain that wakes you in the night. °· You have abdominal pain that is worsened or improved by eating food. °· You have abdominal pain that is worsened with eating fatty foods. °· You have a fever. °SEEK IMMEDIATE MEDICAL CARE IF: °· Your pain does not go away within 2 hours. °· You keep throwing up (vomiting). °· Your pain is felt only in portions of the abdomen, such as the right side or the left lower portion of the abdomen. °· You pass bloody or black tarry stools. °MAKE SURE YOU: °· Understand these instructions. °· Will watch your condition. °· Will get help right away if you are not doing well or get worse. °  °This information is not intended to replace advice given to you by your health care provider. Make sure you discuss  any questions you have with your health care provider. °  °Document Released: 12/25/2004 Document Revised: 12/06/2014 Document Reviewed: 11/24/2012 °Elsevier Interactive Patient Education ©2016 [employer] °Ovarian Cyst °An ovarian cyst is a fluid-filled sac that forms on an ovary. The ovaries are small organs that produce eggs in women. Various types of cysts can form on the ovaries. Most are not cancerous. Many do not cause problems, and they often go away on their own. Some may cause symptoms and require treatment. Common types of ovarian cysts include: °· Functional cysts--These cysts may occur every month during the menstrual cycle. This is normal. The cysts usually go away with the next menstrual cycle if the woman does not get pregnant. Usually, there are no symptoms with a functional cyst. °· Endometrioma cysts--These cysts form from the tissue that lines the uterus. They are also called "chocolate cysts" because they become filled with blood that turns brown. This type of cyst can cause pain in the lower abdomen during intercourse and with your menstrual period. °· Cystadenoma cysts--This type develops from the cells on the outside of the ovary. These cysts can get very big and cause lower abdomen pain and pain with intercourse. This type of cyst can twist on itself, cut off its blood supply, and cause severe pain. It can also easily rupture and cause a lot of pain. °· Dermoid cysts--This type of cyst is sometimes found in both ovaries. These cysts may contain different kinds of body tissue, such as   skin, teeth, hair, or cartilage. They usually do not cause symptoms unless they get very big. °· Theca lutein cysts--These cysts occur when too much of a certain hormone (human chorionic gonadotropin) is produced and overstimulates the ovaries to produce an egg. This is most common after procedures used to assist with the conception of a baby (in vitro fertilization). °CAUSES  °· Fertility drugs can cause a  condition in which multiple large cysts are formed on the ovaries. This is called ovarian hyperstimulation syndrome. °· A condition called polycystic ovary syndrome can cause hormonal imbalances that can lead to nonfunctional ovarian cysts. °SIGNS AND SYMPTOMS  °Many ovarian cysts do not cause symptoms. If symptoms are present, they may include: °· Pelvic pain or pressure. °· Pain in the lower abdomen. °· Pain during sexual intercourse. °· Increasing girth (swelling) of the abdomen. °· Abnormal menstrual periods. °· Increasing pain with menstrual periods. °· Stopping having menstrual periods without being pregnant. °DIAGNOSIS  °These cysts are commonly found during a routine or annual pelvic exam. Tests may be ordered to find out more about the cyst. These tests may include: °· Ultrasound. °· X-ray of the pelvis. °· CT scan. °· MRI. °· Blood tests. °TREATMENT  °Many ovarian cysts go away on their own without treatment. Your health care provider may want to check your cyst regularly for 2-3 months to see if it changes. For women in menopause, it is particularly important to monitor a cyst closely because of the higher rate of ovarian cancer in menopausal women. When treatment is needed, it may include any of the following: °· A procedure to drain the cyst (aspiration). This may be done using a long needle and ultrasound. It can also be done through a laparoscopic procedure. This involves using a thin, lighted tube with a tiny camera on the end (laparoscope) inserted through a small incision. °· Surgery to remove the whole cyst. This may be done using laparoscopic surgery or an open surgery involving a larger incision in the lower abdomen. °· Hormone treatment or birth control pills. These methods are sometimes used to help dissolve a cyst. °HOME CARE INSTRUCTIONS  °· Only take over-the-counter or prescription medicines as directed by your health care provider. °· Follow up with your health care provider as  directed. °· Get regular pelvic exams and Pap tests. °SEEK MEDICAL CARE IF:  °· Your periods are late, irregular, or painful, or they stop. °· Your pelvic pain or abdominal pain does not go away. °· Your abdomen becomes larger or swollen. °· You have pressure on your bladder or trouble emptying your bladder completely. °· You have pain during sexual intercourse. °· You have feelings of fullness, pressure, or discomfort in your stomach. °· You lose weight for no apparent reason. °· You feel generally ill. °· You become constipated. °· You lose your appetite. °· You develop acne. °· You have an increase in body and facial hair. °· You are gaining weight, without changing your exercise and eating habits. °· You think you are pregnant. °SEEK IMMEDIATE MEDICAL CARE IF:  °· You have increasing abdominal pain. °· You feel sick to your stomach (nauseous), and you throw up (vomit). °· You develop a fever that comes on suddenly. °· You have abdominal pain during a bowel movement. °· Your menstrual periods become heavier than usual. °MAKE SURE YOU: °· Understand these instructions. °· Will watch your condition. °· Will get help right away if you are not doing well or get worse. °  °  This information is not intended to replace advice given to you by your health care provider. Make sure you discuss any questions you have with your health care provider. °  °Document Released: 03/17/2005 Document Revised: 03/22/2013 Document Reviewed: 11/22/2012 °Elsevier Interactive Patient Education ©2016 [employer] Inc. ° °

## 2015-04-29 NOTE — ED Notes (Signed)
Patient c/o lower abdominal pain x2 weeks and abdominal pressure x2 days.  Patient states pain is worse when having intercourse, when having a BM and when sitting down.  Patient states pressure/pain is relieved somewhat by standing.  Patient denies vaginal discharge.  Patient endorses nausea, but denies emesis and fever.  Patient has Mirena for [facility].  Patient has hx of ovarian cysts.

## 2015-04-29 NOTE — ED Provider Notes (Signed)
CSN: [id number]     Arrival date & time 04/29/15  1456 History   First MD Initiated Contact with Patient 04/29/15 1532     Chief Complaint  Patient presents with  . Abdominal Cramping     (Consider location/radiation/quality/duration/timing/severity/associated sxs/prior Treatment) HPI Comments: Patient presents to the ED with a chief complaint of lower abdominal pain and cramping x 2 weeks.  She has a Mirena IUD placed by Dr. Ellyn Hack.  She states that 2 days ago she began having some associated lower abdominal pressure.  She states that the pain is worsened when she is having a BM or when sitting down.  It is relieved by standing.  She rates the pain as a 4/10.  She denies any discharge or bleeding.  Denies any dysuria.  She reports some associated nausea, but denies any vomiting.  She reports a history of ovarian cysts and states that this felt similar until the past 2 days when she began to feel more pressure.  The history is provided by the patient. No language interpreter was used.    Past Medical History  Diagnosis Date  . Thyroid disease   . Acid reflux   . Depression   . Hypothyroid   . Ovarian cyst    History reviewed. No pertinent past surgical history. No family history on file. Social History  Substance Use Topics  . Smoking status: Current Every Day Smoker -- 0.50 packs/day for 8 years    Types: Cigarettes  . Smokeless tobacco: Never Used  . Alcohol Use: Yes     Comment: occasionally   OB History    No data available     Review of Systems  Gastrointestinal: Positive for nausea.  Genitourinary: Positive for pelvic pain.  All other systems reviewed and are negative.     Allergies  Review of patient's allergies indicates no known allergies.  Home Medications   Prior to Admission medications   Medication Sig Start Date End Date Taking? Authorizing Provider  levonorgestrel (MIRENA) 20 MCG/24HR IUD 1 each by Intrauterine route once.     Yes Historical  Provider, MD  mupirocin nasal ointment (BACTROBAN) 2 % Apply in each nostril daily Patient taking differently: Place 1 application into the nose 2 (two) times daily as needed (irritation). Apply in each nostril daily 01/16/15  Yes Nelva Nay, MD  naproxen (NAPROSYN) 500 MG tablet Take 1 tablet (500 mg total) by mouth 2 (two) times daily with a meal. Patient taking differently: Take 500 mg by mouth 2 (two) times daily as needed for moderate pain.  08/15/14  Yes Blake Divine, MD  doxycycline (VIBRAMYCIN) 100 MG capsule Take 1 capsule (100 mg total) by mouth 2 (two) times daily. Patient not taking: Reported on 04/29/2015 01/16/15   Nelva Nay, MD  levothyroxine (SYNTHROID, LEVOTHROID) 100 MCG tablet Take 100 mcg by mouth daily. Reported on 04/29/2015    Historical Provider, MD   BP 132/89 mmHg  Pulse 96  Temp(Src) 98 F (36.7 C) (Oral)  Resp 19  SpO2 100%  LMP 02/22/2015 Physical Exam  Constitutional: She is oriented to person, place, and time. She appears well-developed and well-nourished.  HENT:  Head: Normocephalic and atraumatic.  Eyes: Conjunctivae and EOM are normal. Pupils are equal, round, and reactive to light.  Neck: Normal range of motion. Neck supple.  Cardiovascular: Normal rate and regular rhythm.  Exam reveals no gallop and no friction rub.   No murmur heard. Pulmonary/Chest: Effort normal and breath sounds normal. No respiratory distress.  She has no wheezes. She has no rales. She exhibits no tenderness.  Abdominal: Soft. Bowel sounds are normal. She exhibits no distension and no mass. There is no tenderness. There is no rebound and no guarding.  Genitourinary:  Pelvic exam chaperoned by female ER tech, moderate right adnexal tenderness, moderate left adnexal tenderness, no uterine tenderness, no vaginal discharge, no bleeding, no CMT or friability, no foreign body, no injury to the external genitalia, no other significant findings   Musculoskeletal: Normal range of  motion. She exhibits no edema or tenderness.  Neurological: She is alert and oriented to person, place, and time.  Skin: Skin is warm and dry.  Psychiatric: She has a normal mood and affect. Her behavior is normal. Judgment and thought content normal.  Nursing note and vitals reviewed.   ED Course  Procedures (including critical care time) Results for orders placed or performed during the hospital encounter of 04/29/15  CBC with Differential/Platelet  Result Value Ref Range   WBC 8.9 4.0 - 10.5 K/uL   RBC 4.25 3.87 - 5.11 MIL/uL   Hemoglobin 13.0 12.0 - 15.0 g/dL   HCT 16.1 09.6 - 04.5 %   MCV 89.4 78.0 - 100.0 fL   MCH 30.6 26.0 - 34.0 pg   MCHC 34.2 30.0 - 36.0 g/dL   RDW 40.9 81.1 - 91.4 %   Platelets 263 150 - 400 K/uL   Neutrophils Relative % 59 %   Neutro Abs 5.3 1.7 - 7.7 K/uL   Lymphocytes Relative 33 %   Lymphs Abs 3.0 0.7 - 4.0 K/uL   Monocytes Relative 6 %   Monocytes Absolute 0.6 0.1 - 1.0 K/uL   Eosinophils Relative 2 %   Eosinophils Absolute 0.1 0.0 - 0.7 K/uL   Basophils Relative 0 %   Basophils Absolute 0.0 0.0 - 0.1 K/uL  Basic metabolic panel  Result Value Ref Range   Sodium 139 135 - 145 mmol/L   Potassium 4.1 3.5 - 5.1 mmol/L   Chloride 105 101 - 111 mmol/L   CO2 24 22 - 32 mmol/L   Glucose, Bld 107 (H) 65 - 99 mg/dL   BUN 9 6 - 20 mg/dL   Creatinine, Ser 7.82 0.44 - 1.00 mg/dL   Calcium 9.2 8.9 - 95.6 mg/dL   GFR calc non Af Amer >60 >60 mL/min   GFR calc Af Amer >60 >60 mL/min   Anion gap 10 5 - 15  I-Stat Beta hCG blood, ED ([facility], [facility], [facility] only)  Result Value Ref Range   I-stat hCG, quantitative <5.0 <5 mIU/mL   Comment 3           US Transvaginal Non-ob  04/29/2015  CLINICAL DATA:  Pelvic tenderness along both adnexa. EXAM: TRANSABDOMINAL AND TRANSVAGINAL ULTRASOUND OF PELVIS TECHNIQUE: Both transabdominal and transvaginal ultrasound examinations of the pelvis were performed. Transabdominal technique was performed for global imaging of the pelvis  including uterus, ovaries, adnexal regions, and pelvic cul-de-sac. It was necessary to proceed with endovaginal exam following the transabdominal exam to visualize the endometrium and ovaries. COMPARISON:  04/03/2014. FINDINGS: Uterus Measurements: 10.0 by 5.4 by 6.2 cm. No fibroids or other mass visualized. Endometrium Thickness: 8 mm. IUD observed, previously noted to have long stem abnormally extending to the right cornu, appears to have a similar position today. Right ovary Measurements: 3.3 by 2.6 by 2.7 cm. Normal appearance/no adnexal mass. Left ovary Measurements: 5.5 by 3.3 by 4.8 cm. The left ovary contains a 3.3 by 2.5 by  3.1 cm lesion with at least 1 internal septation and some internal echoes, but without a definite thick marginal wall. Other findings Trace free pelvic fluid. IMPRESSION: 1. Complex cystic lesion of the left ovary measures 3.3 by 2.5 by 3.1 cm and has faint internal echoes and an internal septation. This could represent a hemorrhagic cyst. Consider followup sonography in 6 weeks time to assess for resolution. 2. The IUD was previously noted to have its long stem abnormally extending to the right cornu. I suspect the appearance today is similar. 3. Trace free pelvic fluid. Electronically Signed   By: Gaylyn Rong M.D.   On: 04/29/2015 17:21   US Pelvis Complete  04/29/2015  CLINICAL DATA:  Pelvic tenderness along both adnexa. EXAM: TRANSABDOMINAL AND TRANSVAGINAL ULTRASOUND OF PELVIS TECHNIQUE: Both transabdominal and transvaginal ultrasound examinations of the pelvis were performed. Transabdominal technique was performed for global imaging of the pelvis including uterus, ovaries, adnexal regions, and pelvic cul-de-sac. It was necessary to proceed with endovaginal exam following the transabdominal exam to visualize the endometrium and ovaries. COMPARISON:  04/03/2014. FINDINGS: Uterus Measurements: 10.0 by 5.4 by 6.2 cm. No fibroids or other mass visualized. Endometrium  Thickness: 8 mm. IUD observed, previously noted to have long stem abnormally extending to the right cornu, appears to have a similar position today. Right ovary Measurements: 3.3 by 2.6 by 2.7 cm. Normal appearance/no adnexal mass. Left ovary Measurements: 5.5 by 3.3 by 4.8 cm. The left ovary contains a 3.3 by 2.5 by 3.1 cm lesion with at least 1 internal septation and some internal echoes, but without a definite thick marginal wall. Other findings Trace free pelvic fluid. IMPRESSION: 1. Complex cystic lesion of the left ovary measures 3.3 by 2.5 by 3.1 cm and has faint internal echoes and an internal septation. This could represent a hemorrhagic cyst. Consider followup sonography in 6 weeks time to assess for resolution. 2. The IUD was previously noted to have its long stem abnormally extending to the right cornu. I suspect the appearance today is similar. 3. Trace free pelvic fluid. Electronically Signed   By: Gaylyn Rong M.D.   On: 04/29/2015 17:21    I have personally reviewed and evaluated these images and lab results as part of my medical decision-making.    MDM   Final diagnoses:  Cyst of ovary, unspecified laterality  Lower abdominal pain    Patient with lower abdominal pain and pelvis pressure.  Hx of ovarian cysts.  No discharge or dysuria.  VSS and afebrile.  No focal abdominal tenderness.   [place] consistent with cyst.  Given written and verbal instructions to follow-up in 6 weeks for reassessment.  Will give ibuprofen.    Roxy Horseman, PA-C 04/29/15 1812  Marily Memos, MD 04/29/15 [phone]

## 2015-04-30 LAB — GC/CHLAMYDIA PROBE AMP (~~LOC~~) NOT AT ARMC
CHLAMYDIA, DNA PROBE: NEGATIVE
Neisseria Gonorrhea: NEGATIVE

## 2015-05-22 ENCOUNTER — Encounter (HOSPITAL_BASED_OUTPATIENT_CLINIC_OR_DEPARTMENT_OTHER): Payer: Self-pay

## 2015-05-22 ENCOUNTER — Emergency Department (HOSPITAL_BASED_OUTPATIENT_CLINIC_OR_DEPARTMENT_OTHER)
Admission: EM | Admit: 2015-05-22 | Discharge: 2015-05-22 | Disposition: A | Discharge status: Home / Self Care | Payer: No Typology Code available for payment source | Attending: Emergency Medicine | Admitting: Emergency Medicine

## 2015-05-22 DIAGNOSIS — B9789 Other viral agents as the cause of diseases classified elsewhere: Secondary | ICD-10-CM

## 2015-05-22 DIAGNOSIS — Z8742 Personal history of other diseases of the female genital tract: Secondary | ICD-10-CM | POA: Insufficient documentation

## 2015-05-22 DIAGNOSIS — Z8719 Personal history of other diseases of the digestive system: Secondary | ICD-10-CM | POA: Insufficient documentation

## 2015-05-22 DIAGNOSIS — Z8659 Personal history of other mental and behavioral disorders: Secondary | ICD-10-CM | POA: Insufficient documentation

## 2015-05-22 DIAGNOSIS — F17210 Nicotine dependence, cigarettes, uncomplicated: Secondary | ICD-10-CM | POA: Insufficient documentation

## 2015-05-22 DIAGNOSIS — E669 Obesity, unspecified: Secondary | ICD-10-CM | POA: Insufficient documentation

## 2015-05-22 DIAGNOSIS — J988 Other specified respiratory disorders: Secondary | ICD-10-CM

## 2015-05-22 DIAGNOSIS — H6123 Impacted cerumen, bilateral: Secondary | ICD-10-CM | POA: Insufficient documentation

## 2015-05-22 DIAGNOSIS — J069 Acute upper respiratory infection, unspecified: Secondary | ICD-10-CM | POA: Insufficient documentation

## 2015-05-22 MED ORDER — PROMETHAZINE-DM 6.25-15 MG/5ML PO SYRP: 5.0000 mL | ORAL_SOLUTION | Freq: Four times a day (QID) | ORAL | Status: DC | PRN

## 2015-05-22 MED ORDER — GUAIFENESIN 100 MG/5ML PO LIQD: 100.0000 mg | ORAL | Status: DC | PRN

## 2015-05-22 NOTE — ED Notes (Signed)
PA at bedside.

## 2015-05-22 NOTE — ED Notes (Addendum)
Dry cough started last night-NAD-steady gait

## 2015-05-22 NOTE — ED Provider Notes (Signed)
CSN: [id number]     Arrival date & time 05/22/15  1534 History   First MD Initiated Contact with Patient 05/22/15 1637     Chief Complaint  Patient presents with  . Cough     (Consider location/radiation/quality/duration/timing/severity/associated sxs/prior Treatment) HPI   25 year old female with history of acid reflux, thyroid disease, and depression presents for evaluation of cough. Patient reports since last night she developing cold symptoms including headache, nasal congestion, ear congestion, sore throat, nonproductive cough, pleuritic chest pain, mild shortness of breath, myalgias, and chills. She has tried over-the-counter medication including hot tea, Tylenol, cough medication with some relief. She has history of strep throat in the past. She denies taking any ACE inhibitor medication. She denies any prior history of PE or DVT, no recent surgery, prolonged bed rest, unilateral leg swelling or calf pain or active cancer. No hemoptysis. Denies any recent sick contact. She is a smoker.  Past Medical History  Diagnosis Date  . Thyroid disease   . Acid reflux   . Depression   . Hypothyroid   . Ovarian cyst    No past surgical history on file. No family history on file. Social History  Substance Use Topics  . Smoking status: Current Every Day Smoker -- 0.50 packs/day for 8 years    Types: Cigarettes  . Smokeless tobacco: Never Used  . Alcohol Use: Yes     Comment: occasionally   OB History    No data available     Review of Systems  All other systems reviewed and are negative.     Allergies  Review of patient's allergies indicates no known allergies.  Home Medications   Prior to Admission medications   Not on File   BP 133/82 mmHg  Pulse 92  Temp(Src) 98.3 F (36.8 C) (Oral)  Resp 18  Ht  (1.702 m)  Wt 104.327 kg  BMI 36.01 kg/m2  SpO2 100%  LMP 05/01/2015 Physical Exam  Constitutional: She is oriented to person, place, and time. She appears  well-developed and well-nourished. No distress.  Obese Caucasian female, nontoxic  HENT:  Head: Atraumatic.  Ears: Mild cerumen impaction bilaterally with TM is mildly erythematous but no effusion Nose: Boggy turbinate and rhinorrhea Throat: Uvula is midline, no tonsillar enlargement or exudates, no trismus  Eyes: Conjunctivae are normal.  Neck: Normal range of motion. Neck supple.  Cardiovascular: Normal rate and regular rhythm.   Pulmonary/Chest: Effort normal and breath sounds normal. No respiratory distress. She has no wheezes. She has no rales. She exhibits no tenderness.  Abdominal: Soft. There is no tenderness.  Musculoskeletal: She exhibits no edema.  Lymphadenopathy:    She has no cervical adenopathy.  Neurological: She is alert and oriented to person, place, and time.  Skin: No rash noted.  Psychiatric: She has a normal mood and affect.  Nursing note and vitals reviewed.   ED Course  Procedures (including critical care time)   MDM   Final diagnoses:  Viral respiratory infection    BP 133/82 mmHg  Pulse 92  Temp(Src) 98.3 F (36.8 C) (Oral)  Resp 18  Ht  (1.702 m)  Wt 104.327 kg  BMI 36.01 kg/m2  SpO2 100%  LMP 05/01/2015   5:10 PM Patient presents with symptoms consistence with viral respiratory infection. Although she has history of acid reflux, I did not think the cough is related to that. She is afebrile, with no hypoxia and a normal exam, doubtful for pneumonia. Her throat exam is  unremarkable, low suspicion for strep. My plan is to provide symptomatic treatment and return precaution.  Fayrene Helper, PA-C 05/22/15 1713  Gwyneth Sprout, MD 05/23/15 2007

## 2015-05-22 NOTE — Discharge Instructions (Signed)
Viral Infections °A viral infection can be caused by different types of viruses. Most viral infections are not serious and resolve on their own. However, some infections may cause severe symptoms and may lead to further complications. °SYMPTOMS °Viruses can frequently cause: °· Minor sore throat. °· Aches and pains. °· Headaches. °· Runny nose. °· Different types of rashes. °· Watery eyes. °· Tiredness. °· Cough. °· Loss of appetite. °· Gastrointestinal infections, resulting in nausea, vomiting, and diarrhea. °These symptoms do not respond to antibiotics because the infection is not caused by bacteria. However, you might catch a bacterial infection following the viral infection. This is sometimes called a "superinfection." Symptoms of such a bacterial infection may include: °· Worsening sore throat with pus and difficulty swallowing. °· Swollen neck glands. °· Chills and a high or persistent fever. °· Severe headache. °· Tenderness over the sinuses. °· Persistent overall ill feeling (malaise), muscle aches, and tiredness (fatigue). °· Persistent cough. °· Yellow, green, or brown mucus production with coughing. °HOME CARE INSTRUCTIONS  °· Only take over-the-counter or prescription medicines for pain, discomfort, diarrhea, or fever as directed by your caregiver. °· Drink enough water and fluids to keep your urine clear or pale yellow. Sports drinks can provide valuable electrolytes, sugars, and hydration. °· Get plenty of rest and maintain proper nutrition. Soups and broths with crackers or rice are fine. °SEEK IMMEDIATE MEDICAL CARE IF:  °· You have severe headaches, shortness of breath, chest pain, neck pain, or an unusual rash. °· You have uncontrolled vomiting, diarrhea, or you are unable to keep down fluids. °· You or your child has an oral temperature above 102° F (38.9° C), not controlled by medicine. °· Your baby is older than 3 months with a rectal temperature of 102° F (38.9° C) or higher. °· Your baby is 3  months old or younger with a rectal temperature of 100.4° F (38° C) or higher. °MAKE SURE YOU:  °· Understand these instructions. °· Will watch your condition. °· Will get help right away if you are not doing well or get worse. °  °This information is not intended to replace advice given to you by your health care provider. Make sure you discuss any questions you have with your health care provider. °  °Document Released: 12/25/2004 Document Revised: 06/09/2011 Document Reviewed: 08/23/2014 °Elsevier Interactive Patient Education ©2016 [employer] ° °

## 2015-08-01 ENCOUNTER — Encounter (HOSPITAL_BASED_OUTPATIENT_CLINIC_OR_DEPARTMENT_OTHER): Payer: Self-pay

## 2015-08-01 ENCOUNTER — Emergency Department (HOSPITAL_BASED_OUTPATIENT_CLINIC_OR_DEPARTMENT_OTHER)
Admission: EM | Admit: 2015-08-01 | Discharge: 2015-08-01 | Disposition: A | Discharge status: Home / Self Care | Payer: No Typology Code available for payment source | Attending: Emergency Medicine | Admitting: Emergency Medicine

## 2015-08-01 DIAGNOSIS — E079 Disorder of thyroid, unspecified: Secondary | ICD-10-CM | POA: Insufficient documentation

## 2015-08-01 DIAGNOSIS — M25561 Pain in right knee: Secondary | ICD-10-CM

## 2015-08-01 DIAGNOSIS — F329 Major depressive disorder, single episode, unspecified: Secondary | ICD-10-CM | POA: Insufficient documentation

## 2015-08-01 DIAGNOSIS — F17210 Nicotine dependence, cigarettes, uncomplicated: Secondary | ICD-10-CM | POA: Insufficient documentation

## 2015-08-01 HISTORY — DX: Polycystic ovarian syndrome: E28.2

## 2015-08-01 MED ORDER — IBUPROFEN 800 MG PO TABS: 800.0000 mg | ORAL_TABLET | Freq: Three times a day (TID) | ORAL | Status: DC

## 2015-08-01 MED ORDER — METHOCARBAMOL 500 MG PO TABS: 500.0000 mg | ORAL_TABLET | Freq: Two times a day (BID) | ORAL | Status: DC

## 2015-08-01 NOTE — ED Notes (Addendum)
Patient walked to room 12, patient asked to undress and place gown on opening in the back.

## 2015-08-01 NOTE — ED Provider Notes (Signed)
CSN: [id number]     Arrival date & time 08/01/15  1544 History   First MD Initiated Contact with Patient 08/01/15 1600     Chief Complaint  Patient presents with  . Knee Pain     (Consider location/radiation/quality/duration/timing/severity/associated sxs/prior Treatment) HPI   25 year old female presenting for evaluation of right knee pain.  Patient states she has had trouble with her right knee since age of 61. 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 For the past 2-3 days she has notice increasing pain to her right knee. She described pain as a constant throbbing 8 out of 10 pain worsening with movement to the touch and improves to 3 out of 10 with taking ibuprofen. Today after standing for a prolonged period of time at work her pain became more intense prompting her to come to the ER for further evaluation. Aside from taking estrogen birth control, patient does not have a significant risk factor for PE or DVT, no recent surgery, prolonged bed rest, active cancer, recent travel, or prior DVT. She denies any recent injury. No complaint of hip or ankle pain. No fever or rash. She is sexually active with the same partner.       Past Medical History  Diagnosis Date  . Thyroid disease   . Acid reflux   . Depression   . Hypothyroid   . Ovarian cyst   . PCOS (polycystic ovarian syndrome)    History reviewed. No pertinent past surgical history. No family history on file. Social History  Substance Use Topics  . Smoking status: Current Every Day Smoker -- 0.50 packs/day for 8 years    Types: Cigarettes  . Smokeless tobacco: Never Used  . Alcohol Use: Yes     Comment: occasionally   OB History    No data available     Review of Systems  Constitutional: Negative for fever.  Musculoskeletal: Positive for arthralgias.  Skin: Negative for rash.  Neurological: Negative for numbness.       Allergies  Review of patient's allergies indicates no known allergies.  Home Medications   Prior to Admission medications   Not on File   BP 150/100 mmHg  Pulse 87  Temp(Src) 98.5 F (36.9 C) (Oral)  Resp 18  Ht 5\' 7"  (1.702 m)  Wt 99.791 kg  BMI 34.45 kg/m2  SpO2 100%  LMP 07/18/2015 Physical Exam  Constitutional: She appears well-developed and well-nourished. No distress.  Obese Caucasian female laying in bed in no acute discomfort.  HENT:  Head: Atraumatic.  Eyes: Conjunctivae are normal.  Neck: Neck supple.  Cardiovascular: Intact distal pulses.   Musculoskeletal: She exhibits tenderness (R knee: tenderness to medial/lateral joint line.  +McMurray.  negative anterior/posterior drawer test.  normal varus/valgus.  no swelling or crepitus.  ).  R hip and R ankle nontender.  Dorsalis pedis pulse palpable  BLE: no palpable cords, erythema, or edema.  Neurological: She is alert.  Ambulate without difficulty  Skin: No rash noted.  Psychiatric: She has a normal mood and affect.  Nursing note and vitals reviewed.   ED Course  Procedures (including critical care time)   MDM   Final diagnoses:  Right knee pain    BP 150/100 mmHg  Pulse 87  Temp(Src) 98.5 F (36.9 C) (Oral)  Resp 18  Ht 5\' 7"  (1.702 m)  Wt 99.791 kg  BMI 34.45 kg/m2  SpO2 100%  LMP 07/18/2015  R knee pain, acute on chronic, likely MSK. Xray not indicated.  Knee sleeve provided . RICE therapy discussed.  Pt is NVI.   Wells criteria for DVT is 1, which pts pt in low risk of DVT.    Fayrene Helper, PA-C 08/01/15 1641  Pricilla Loveless, MD 08/03/15 [phone]

## 2015-08-01 NOTE — ED Notes (Signed)
Pain/swelling to right knee x 2-3 days-denies injury-NAD-steady gait

## 2015-08-01 NOTE — Discharge Instructions (Signed)

## 2015-08-08 ENCOUNTER — Encounter (HOSPITAL_BASED_OUTPATIENT_CLINIC_OR_DEPARTMENT_OTHER): Payer: Self-pay

## 2015-08-08 ENCOUNTER — Emergency Department (HOSPITAL_BASED_OUTPATIENT_CLINIC_OR_DEPARTMENT_OTHER)
Admission: EM | Admit: 2015-08-08 | Discharge: 2015-08-08 | Disposition: A | Discharge status: Home / Self Care | Payer: No Typology Code available for payment source | Attending: Emergency Medicine | Admitting: Emergency Medicine

## 2015-08-08 DIAGNOSIS — F329 Major depressive disorder, single episode, unspecified: Secondary | ICD-10-CM | POA: Insufficient documentation

## 2015-08-08 DIAGNOSIS — F17210 Nicotine dependence, cigarettes, uncomplicated: Secondary | ICD-10-CM | POA: Insufficient documentation

## 2015-08-08 DIAGNOSIS — E079 Disorder of thyroid, unspecified: Secondary | ICD-10-CM | POA: Insufficient documentation

## 2015-08-08 DIAGNOSIS — R109 Unspecified abdominal pain: Secondary | ICD-10-CM

## 2015-08-08 DIAGNOSIS — R197 Diarrhea, unspecified: Secondary | ICD-10-CM | POA: Insufficient documentation

## 2015-08-08 LAB — PREGNANCY, URINE: PREG TEST UR: NEGATIVE

## 2015-08-08 LAB — URINALYSIS, ROUTINE W REFLEX MICROSCOPIC
Bilirubin Urine: NEGATIVE
Glucose, UA: NEGATIVE mg/dL
HGB URINE DIPSTICK: NEGATIVE
Ketones, ur: NEGATIVE mg/dL
LEUKOCYTES UA: NEGATIVE
Nitrite: NEGATIVE
Protein, ur: NEGATIVE mg/dL
SPECIFIC GRAVITY, URINE: 1.02 (ref 1.005–1.030)
pH: 6.5 (ref 5.0–8.0)

## 2015-08-08 MED ORDER — ONDANSETRON HCL 4 MG PO TABS: 4.0000 mg | ORAL_TABLET | Freq: Four times a day (QID) | ORAL | Status: DC

## 2015-08-08 MED ORDER — LOPERAMIDE HCL 2 MG PO CAPS: 2.0000 mg | ORAL_CAPSULE | Freq: Four times a day (QID) | ORAL | Status: DC | PRN

## 2015-08-08 NOTE — ED Provider Notes (Signed)
CSN: [id number]     Arrival date & time 08/08/15  1627 History   First MD Initiated Contact with Patient 08/08/15 1710     Chief Complaint  Patient presents with  . Diarrhea     (Consider location/radiation/quality/duration/timing/severity/associated sxs/prior Treatment) HPI Taylor Donovan is a 25 y.o. female with a reported history of hypo-and hyperthyroidism, PCOS, here for evaluation of nausea and diarrhea. Patient reports symptoms have been ongoing for the past 2 days. She has taken Motrin without significant relief. She reports a fever at home of 69F. Last menstrual cycle one month ago and was normal for her. She reports diffuse abdominal discomfort and cramping. Denies any overt emesis, hematochezia or melena, urinary symptoms, unusual vaginal bleeding or discharge. No other modifying factors.  Past Medical History  Diagnosis Date  . Thyroid disease   . Acid reflux   . Depression   . Hypothyroid   . Ovarian cyst   . PCOS (polycystic ovarian syndrome)    History reviewed. No pertinent past surgical history. No family history on file. Social History  Substance Use Topics  . Smoking status: Current Every Day Smoker -- 0.50 packs/day for 8 years    Types: Cigarettes  . Smokeless tobacco: Never Used  . Alcohol Use: Yes     Comment: occasionally   OB History    No data available     Review of Systems A 10 point review of systems was completed and was negative except for pertinent positives and negatives as mentioned in the history of present illness     Allergies  Review of patient's allergies indicates no known allergies.  Home Medications   Prior to Admission medications   Medication Sig Start Date End Date Taking? Authorizing Provider  ibuprofen (ADVIL,MOTRIN) 800 MG tablet Take 1 tablet (800 mg total) by mouth 3 (three) times daily. 08/01/15   Fayrene Helper, PA-C  loperamide (IMODIUM) 2 MG capsule Take 1 capsule (2 mg total) by mouth 4 (four) times daily as needed  for diarrhea or loose stools. 08/08/15   Joycie Peek, PA-C  methocarbamol (ROBAXIN) 500 MG tablet Take 1 tablet (500 mg total) by mouth 2 (two) times daily. 08/01/15   Fayrene Helper, PA-C  ondansetron (ZOFRAN) 4 MG tablet Take 1 tablet (4 mg total) by mouth every 6 (six) hours. 08/08/15   Treylen Gibbs, PA-C   BP 135/96 mmHg  Pulse 79  Temp(Src) 99 F (37.2 C) (Oral)  Resp 18  Ht  (1.676 m)  Wt 104.327 kg  BMI 37.14 kg/m2  SpO2 100%  LMP 07/18/2015 Physical Exam  Constitutional: She is oriented to person, place, and time. She appears well-developed and well-nourished.  Very well-appearing, laughing and in no apparent distress.  HENT:  Head: Normocephalic and atraumatic.  Mouth/Throat: Oropharynx is clear and moist.  Eyes: Conjunctivae are normal. Pupils are equal, round, and reactive to light. Right eye exhibits no discharge. Left eye exhibits no discharge. No scleral icterus.  Neck: Neck supple.  Cardiovascular: Normal rate, regular rhythm and normal heart sounds.   Pulmonary/Chest: Effort normal and breath sounds normal. No respiratory distress. She has no wheezes. She has no rales.  Abdominal: Soft. There is no tenderness.  Unremarkable abdominal exam  Musculoskeletal: She exhibits no tenderness.  Neurological: She is alert and oriented to person, place, and time.  Cranial Nerves II-XII grossly intact  Skin: Skin is warm and dry. No rash noted.  Psychiatric: She has a normal mood and affect.  Nursing note and vitals  reviewed.   ED Course  Procedures (including critical care time) Labs Review Labs Reviewed  URINALYSIS, ROUTINE W REFLEX MICROSCOPIC (NOT AT [facility]) - Abnormal; Notable for the following:    APPearance CLOUDY (*)    All other components within normal limits  PREGNANCY, URINE    Imaging Review No results found. I have personally reviewed and evaluated these images and lab results as part of my medical decision-making.   EKG Interpretation None      Meds given in ED:  Medications - No data to display  New Prescriptions   LOPERAMIDE (IMODIUM) 2 MG CAPSULE    Take 1 capsule (2 mg total) by mouth 4 (four) times daily as needed for diarrhea or loose stools.   ONDANSETRON (ZOFRAN) 4 MG TABLET    Take 1 tablet (4 mg total) by mouth every 6 (six) hours.   Filed Vitals:   08/08/15 1636  BP: 135/96  Pulse: 79  Temp: 99 F (37.2 C)  TempSrc: Oral  Resp: 18  Height: 5\' 6"  (1.676 m)  Weight: 104.327 kg  SpO2: 100%    MDM  Liesl Donovan is a 25 y.o. female who presents relation of nausea and diarrhea over the past 2 days. Low suspicion for any acute or emergent pathology at this time. On arrival, she is afebrile, hemodynamically stable and appears very well. She appears very well and has an unremarkable abdominal exam. Urine without evidence of infection, pregnancy negative. Low suspicion for appendicitis, cholecystitis, other acute or surgical abdomen. We'll treat symptomatically with Zofran and loperamide. Encouraged follow-up with PCP in 2-3 days for reevaluation. Discussed return precautions including, but not limited to, worsening abdominal pain, bloody or dark stools, fevers or other worrisome or concerning symptoms. The patient and family bedside verbalize understanding and agreement with this plan and subsequent discharge. Final diagnoses:  Abdominal discomfort       Joycie Peek, PA-C 08/08/15 1735  Lavera Guise, MD 08/09/15 [phone]

## 2015-08-08 NOTE — ED Notes (Signed)
Pt c/o fever, chills, diarrhea and abdominal cramping/tightness since yesterday.  Pt denies vomiting and states "countless" episodes of diarrhea yesterday.  Pt able to keep food and fluids down but states she hasn't eaten "as much".  Pt reports 0 bouts of diarrhea today, but continued epigastric cramping/pain.

## 2015-08-08 NOTE — Discharge Instructions (Signed)
There does not appear to be an emergent cause her symptoms at this time. Her symptoms are likely viral in nature and should resolve on their own. You may take your Zofran for nausea, loperamide for diarrhea. Return for any new or worsening symptoms as we discussed.  Abdominal Pain, Adult Many things can cause abdominal pain. Usually, abdominal pain is not caused by a disease and will improve without treatment. It can often be observed and treated at home. Your health care provider will do a physical exam and possibly order blood tests and X-rays to help determine the seriousness of your pain. However, in many cases, more time must pass before a clear cause of the pain can be found. Before that point, your health care provider may not know if you need more testing or further treatment. HOME CARE INSTRUCTIONS Monitor your abdominal pain for any changes. The following actions may help to alleviate any discomfort you are experiencing:  Only take over-the-counter or prescription medicines as directed by your health care provider.  Do not take laxatives unless directed to do so by your health care provider.  Try a clear liquid diet (broth, tea, or water) as directed by your health care provider. Slowly move to a bland diet as tolerated. SEEK MEDICAL CARE IF:  You have unexplained abdominal pain.  You have abdominal pain associated with nausea or diarrhea.  You have pain when you urinate or have a bowel movement.  You experience abdominal pain that wakes you in the night.  You have abdominal pain that is worsened or improved by eating food.  You have abdominal pain that is worsened with eating fatty foods.  You have a fever. SEEK IMMEDIATE MEDICAL CARE IF:  Your pain does not go away within 2 hours.  You keep throwing up (vomiting).  Your pain is felt only in portions of the abdomen, such as the right side or the left lower portion of the abdomen.  You pass bloody or black tarry  stools. MAKE SURE YOU:  Understand these instructions.  Will watch your condition.  Will get help right away if you are not doing well or get worse.   This information is not intended to replace advice given to you by your health care provider. Make sure you discuss any questions you have with your health care provider.   Document Released: 12/25/2004 Document Revised: 12/06/2014 Document Reviewed: 11/24/2012 Elsevier Interactive Patient Education [employer]

## 2015-08-08 NOTE — ED Notes (Signed)
C/o n/d x 2 days-NAD-steady gait

## 2015-10-01 IMAGING — CR DG ABDOMEN ACUTE W/ 1V CHEST
4 series · 4 of 4 positions shown · non-contrast
Comparison: CT abdomen and pelvis 01/02/2014

CLINICAL DATA: Bloated cramping abdominal pain for 2 weeks. Nausea
and vomiting.

EXAM:
ACUTE ABDOMEN SERIES (ABDOMEN 2 VIEW & CHEST 1 VIEW)

[w chest pa]
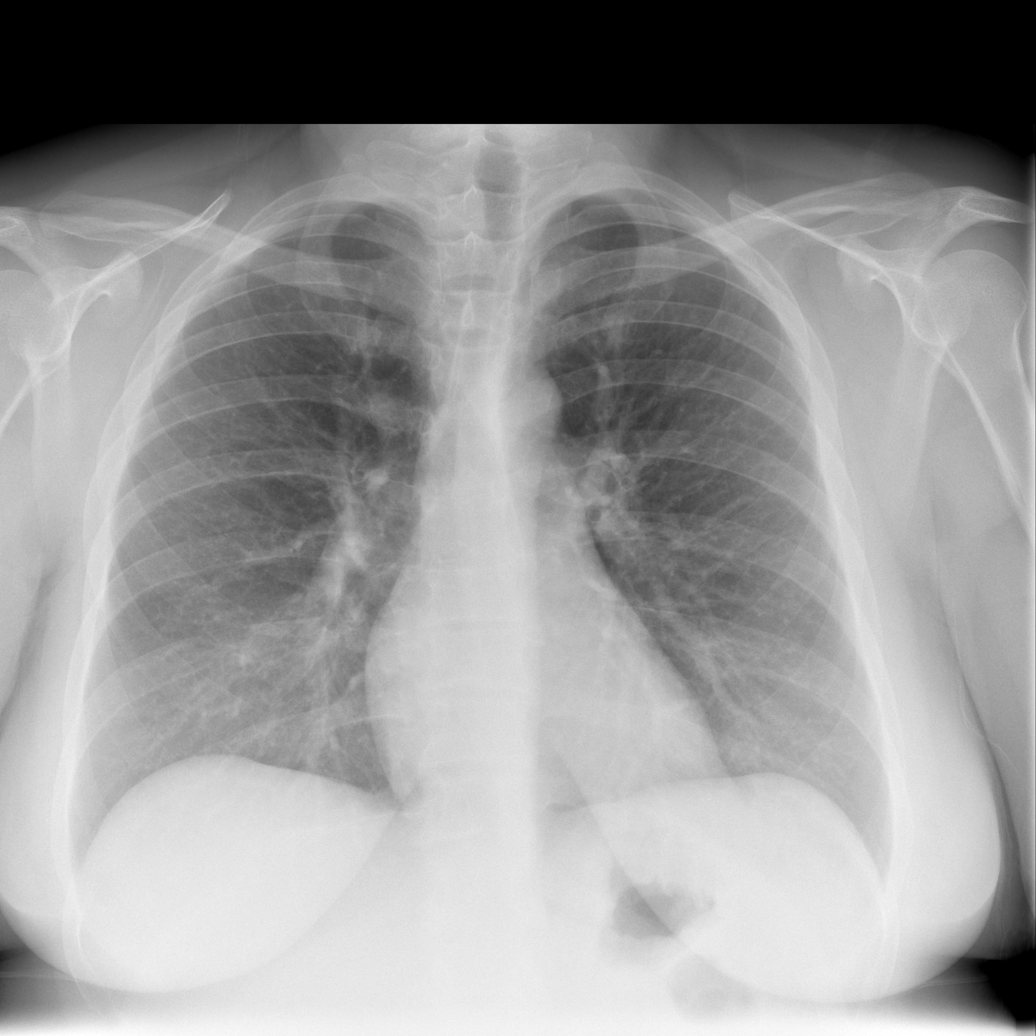

[w abdomen upright]
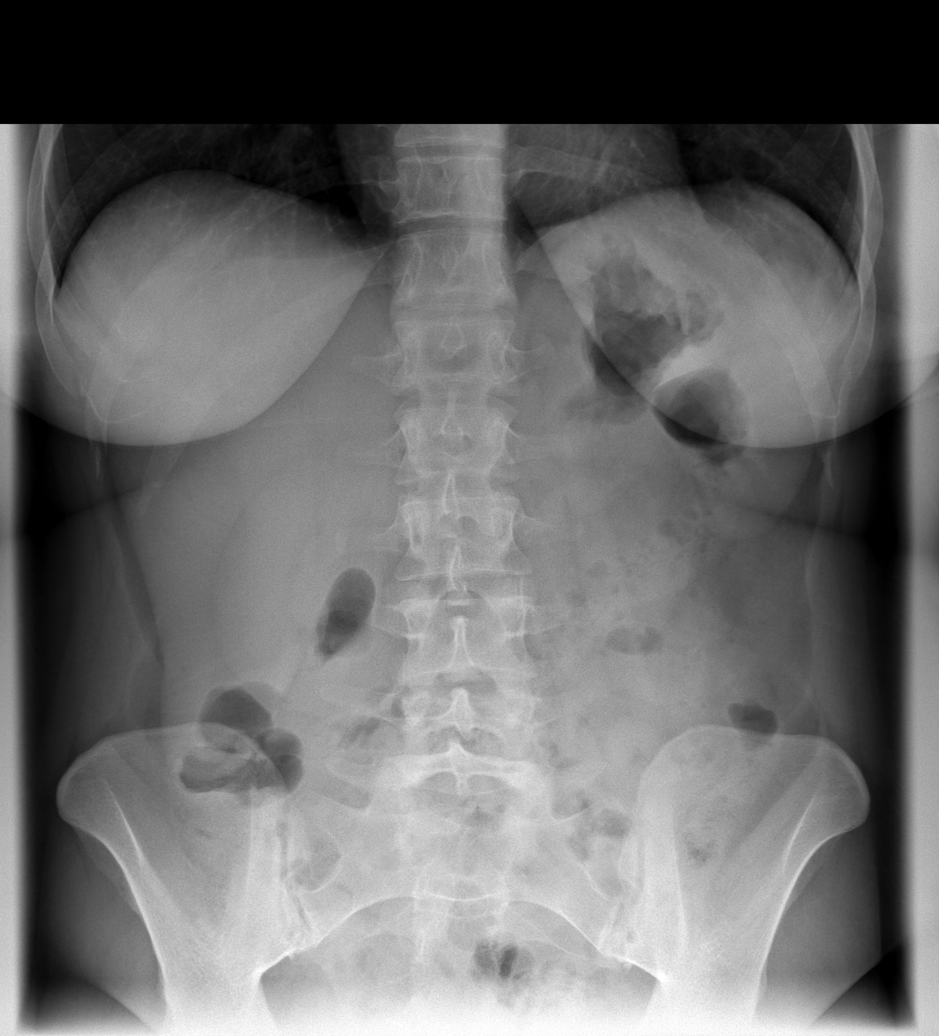

[t abdomen supine (1 of 2)]
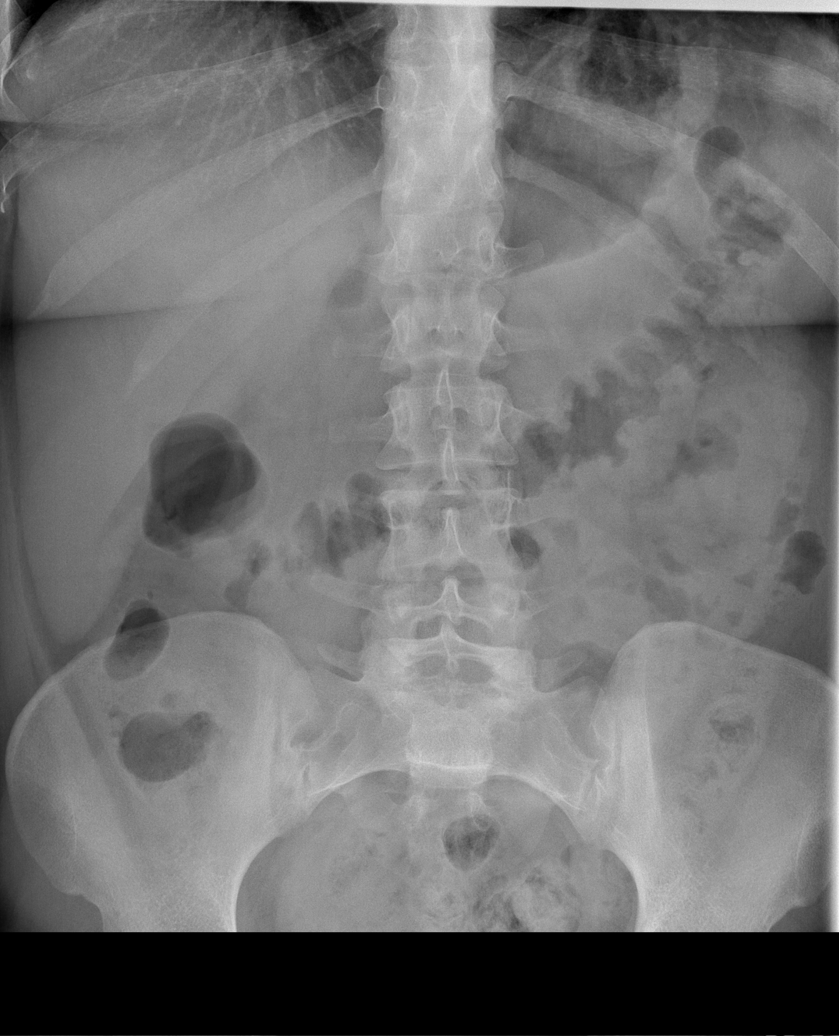

[t abdomen supine (2 of 2)]
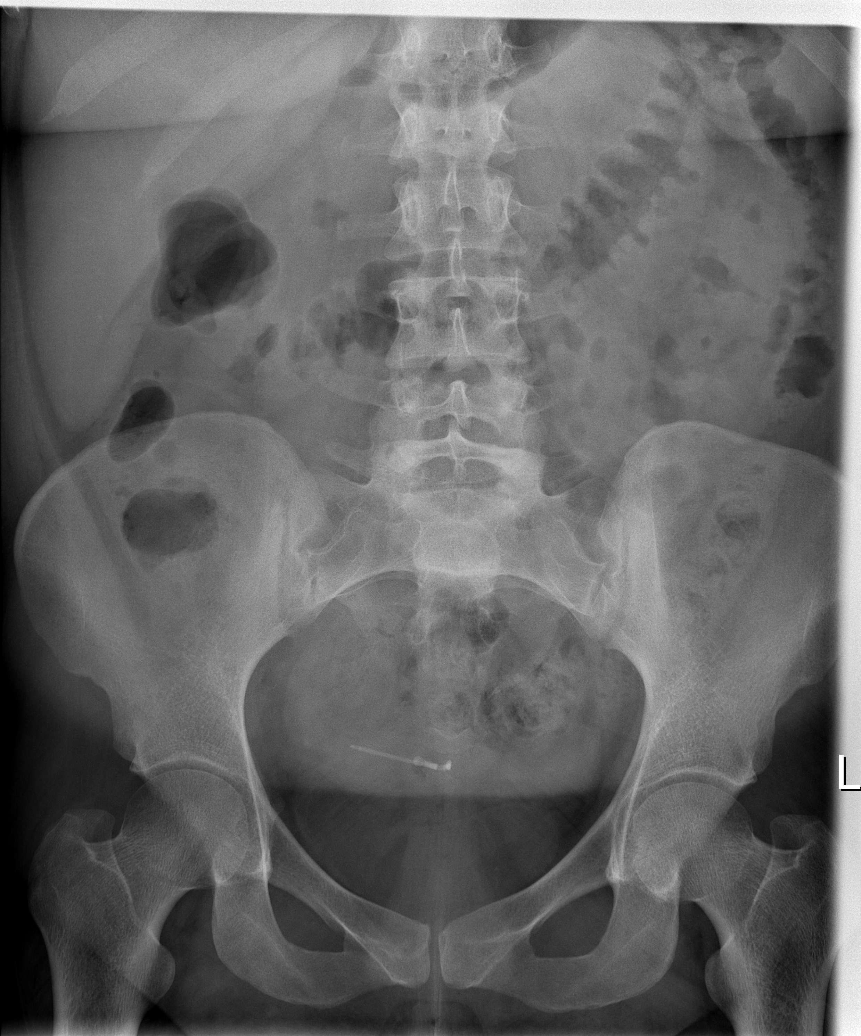

[4 of 4 positions shown; findings below may reference images not displayed]

FINDINGS: Normal heart size and pulmonary vascularity. No focal airspace
disease or consolidation in the lungs. No blunting of costophrenic
angles. No pneumothorax. Mediastinal contours appear intact.

Scattered gas and stool in the colon. No small or large bowel
distention. No free intra-abdominal air. No abnormal air-fluid
levels. No radiopaque stones. Visualized bones appear intact.
Intrauterine device.
IMPRESSION: No evidence of active pulmonary disease. Nonobstructive bowel gas
pattern.

## 2015-11-28 ENCOUNTER — Encounter (HOSPITAL_COMMUNITY): Payer: Self-pay | Admitting: Emergency Medicine

## 2015-11-28 ENCOUNTER — Emergency Department (HOSPITAL_COMMUNITY)
Admission: EM | Admit: 2015-11-28 | Discharge: 2015-11-28 | Disposition: A | Discharge status: Home / Self Care | Payer: Medicaid Other | Attending: Emergency Medicine | Admitting: Emergency Medicine

## 2015-11-28 DIAGNOSIS — Z79899 Other long term (current) drug therapy: Secondary | ICD-10-CM | POA: Insufficient documentation

## 2015-11-28 DIAGNOSIS — R300 Dysuria: Secondary | ICD-10-CM | POA: Insufficient documentation

## 2015-11-28 DIAGNOSIS — R109 Unspecified abdominal pain: Secondary | ICD-10-CM | POA: Insufficient documentation

## 2015-11-28 DIAGNOSIS — F17210 Nicotine dependence, cigarettes, uncomplicated: Secondary | ICD-10-CM | POA: Insufficient documentation

## 2015-11-28 LAB — CBC
HCT: 39.7 % (ref 36.0–46.0)
HEMOGLOBIN: 13.4 g/dL (ref 12.0–15.0)
MCH: 29.8 pg (ref 26.0–34.0)
MCHC: 33.8 g/dL (ref 30.0–36.0)
MCV: 88.2 fL (ref 78.0–100.0)
PLATELETS: 299 10*3/uL (ref 150–400)
RBC: 4.5 MIL/uL (ref 3.87–5.11)
RDW: 12.3 % (ref 11.5–15.5)
WBC: 7.6 10*3/uL (ref 4.0–10.5)

## 2015-11-28 LAB — COMPREHENSIVE METABOLIC PANEL
ALBUMIN: 4.3 g/dL (ref 3.5–5.0)
ALK PHOS: 77 U/L (ref 38–126)
ALT: 19 U/L (ref 14–54)
ANION GAP: 6 (ref 5–15)
AST: 17 U/L (ref 15–41)
BILIRUBIN TOTAL: 0.2 mg/dL — AB (ref 0.3–1.2)
BUN: 12 mg/dL (ref 6–20)
CALCIUM: 9.1 mg/dL (ref 8.9–10.3)
CO2: 23 mmol/L (ref 22–32)
CREATININE: 0.77 mg/dL (ref 0.44–1.00)
Chloride: 111 mmol/L (ref 101–111)
GFR calc Af Amer: >60 mL/min (ref >60)
GFR calc non Af Amer: >60 mL/min (ref >60)
GLUCOSE: 92 mg/dL (ref 65–99)
Potassium: 3.5 mmol/L (ref 3.5–5.1)
Sodium: 140 mmol/L (ref 135–145)
TOTAL PROTEIN: 7.8 g/dL (ref 6.5–8.1)

## 2015-11-28 LAB — URINALYSIS, ROUTINE W REFLEX MICROSCOPIC
BILIRUBIN URINE: NEGATIVE
Glucose, UA: NEGATIVE mg/dL
Hgb urine dipstick: NEGATIVE
KETONES UR: NEGATIVE mg/dL
Leukocytes, UA: NEGATIVE
NITRITE: NEGATIVE
PROTEIN: NEGATIVE mg/dL
Specific Gravity, Urine: 1.032 — ABNORMAL HIGH (ref 1.005–1.030)
pH: 6 (ref 5.0–8.0)

## 2015-11-28 LAB — I-STAT BETA HCG BLOOD, ED (MC, WL, AP ONLY)

## 2015-11-28 LAB — LIPASE, BLOOD: Lipase: 24 U/L (ref 11–51)

## 2015-11-28 MED ORDER — IBUPROFEN 800 MG PO TABS
800.0000 mg | ORAL_TABLET | Freq: Once | ORAL | Status: AC
  Administered 2015-11-28: 800 mg via ORAL
  Filled 2015-11-28: qty 1

## 2015-11-28 NOTE — ED Notes (Signed)
Patient was alert, oriented and stable upon discharge. RN went over AVS and patient had no further questions.  

## 2015-11-28 NOTE — ED Provider Notes (Signed)
[facility]-EMERGENCY DEPT Provider Note   CSN: [id number] Arrival date & time: 11/28/15  1354     History   Chief Complaint Chief Complaint  Patient presents with  . Flank Pain  . Urinary Frequency  . Diarrhea    HPI Taylor Donovan is a 25 y.o. female.  The history is provided by the patient.  Flank Pain  This is a new problem. Episode onset: 4 days. The problem occurs constantly. Progression since onset: waxing and waning. Associated symptoms comments: Urinary frequency, dysuria. Nothing aggravates the symptoms. Nothing relieves the symptoms. Treatments tried: cranberry juice. The treatment provided no relief.    Past Medical History:  Diagnosis Date  . Acid reflux   . Depression   . Hypothyroid   . Ovarian cyst   . PCOS (polycystic ovarian syndrome)   . Thyroid disease     Patient Active Problem List   Diagnosis Date Noted  . HASHIMOTO'S THYROIDITIS 08/13/2009  . DYSPNEA ON EXERTION 08/13/2009    History reviewed. No pertinent surgical history.  OB History    No data available       Home Medications    Prior to Admission medications   Medication Sig Start Date End Date Taking? Authorizing Provider  ibuprofen (ADVIL,MOTRIN) 200 MG tablet Take 800 mg by mouth every 6 (six) hours as needed for headache or moderate pain.   Yes Historical Provider, MD  VIENVA 0.1-20 MG-MCG tablet Take 1 tablet by mouth daily. 11/08/15  Yes Historical Provider, MD  ibuprofen (ADVIL,MOTRIN) 800 MG tablet Take 1 tablet (800 mg total) by mouth 3 (three) times daily. Patient not taking: Reported on 11/28/2015 08/01/15   Fayrene HelperBowie Tran, PA-C  loperamide (IMODIUM) 2 MG capsule Take 1 capsule (2 mg total) by mouth 4 (four) times daily as needed for diarrhea or loose stools. Patient not taking: Reported on 11/28/2015 08/08/15   Joycie PeekBenjamin Cartner, PA-C  methocarbamol (ROBAXIN) 500 MG tablet Take 1 tablet (500 mg total) by mouth 2 (two) times daily. Patient not taking: Reported on 11/28/2015 08/01/15    Fayrene HelperBowie Tran, PA-C  ondansetron (ZOFRAN) 4 MG tablet Take 1 tablet (4 mg total) by mouth every 6 (six) hours. Patient not taking: Reported on 11/28/2015 08/08/15   Joycie PeekBenjamin Cartner, PA-C    Family History No family history on file.  Social History Social History  Substance Use Topics  . Smoking status: Current Every Day Smoker    Packs/day: 0.50    Years: 8.00    Types: Cigarettes  . Smokeless tobacco: Never Used  . Alcohol use Yes     Comment: occasionally     Allergies   Review of patient's allergies indicates no known allergies.   Review of Systems Review of Systems  Genitourinary: Positive for flank pain, dysuria.  All other systems reviewed are negative   Physical Exam Updated Vital Signs BP 139/82 (BP Location: Left Arm)   Pulse 98   Temp 98 F (36.7 C) (Oral)   Resp 18   LMP 11/08/2015   SpO2 99%   Physical Exam BP 139/82 (BP Location: Left Arm)   Pulse 98   Temp 98 F (36.7 C) (Oral)   Resp 18   LMP 11/08/2015   SpO2 99%   Physical Exam  Constitutional: She is oriented to person, place, and time and well-developed, well-nourished, and in no distress. No distress.  HENT:  Head: Normocephalic and atraumatic.  Eyes: Conjunctivae and EOM are normal.  Cardiovascular: Normal rate and regular rhythm.   Pulmonary/Chest: Effort  normal. No respiratory distress.  Abdominal: Soft. She exhibits no distension. There is no tenderness. There is CVA tenderness (mild on right). There is no rebound and no guarding.  Musculoskeletal: Normal range of motion.  Neurological: She is alert and oriented to person, place, and time.  Skin: Skin is warm and dry.  Psychiatric: Mood and affect normal.  Vitals reviewed.   ED Treatments / Results  Labs (all labs ordered are listed, but only abnormal results are displayed) Labs Reviewed  COMPREHENSIVE METABOLIC PANEL - Abnormal; Notable for the following:       Result Value   Total Bilirubin 0.2 (*)    All other components  within normal limits  URINALYSIS, ROUTINE W REFLEX MICROSCOPIC (NOT AT [facility]) - Abnormal; Notable for the following:    Specific Gravity, Urine 1.032 (*)    All other components within normal limits  URINE CULTURE  LIPASE, BLOOD  CBC  I-STAT BETA HCG BLOOD, ED ([facility], [facility], [facility] ONLY)    EKG  EKG Interpretation None       Radiology No results found.  Procedures Procedures (including critical care time)  Emergency Focused Ultrasound Exam Limited Retroperitoneal Ultrasound of Kidneys  Performed and interpreted by Dr. Clydene Pugh Focused abdominal ultrasound with both kidneys imaged in transverse and longitudinal planes in real-time. Indication: flank pain Findings: bilateral kidneys present, no shadowing, no anechoic areas Interpretation: no hydronephrosis visualized.  no stones or cysts visualized  Images archived electronically  CPT Code: 23762   Medications Ordered in ED Medications - No data to display   Initial Impression / Assessment and Plan / ED Course  I have reviewed the triage vital signs and the nursing notes.  Pertinent labs & imaging results that were available during my care of the patient were reviewed by me and considered in my medical decision making (see chart for details).  Clinical Course    25 y.o. female presents with Intermittent right flank pain over the last day which has been accompanied by urinary frequency. UA unremarkable without signs of obstructive uropathy. Supportive care measures recommended pending culture. Would treat with ABx if positive but no indication for empiric treatment given lack of objective findings. Plan to follow up with PCP as needed and return precautions discussed for worsening or new concerning symptoms.   Final Clinical Impressions(s) / ED Diagnoses   Final diagnoses:  Right flank pain  Dysuria    New Prescriptions New Prescriptions   No medications on file     Lyndal Pulley, MD 11/29/15 [phone]

## 2015-11-28 NOTE — ED Triage Notes (Signed)
Patient c/o right side pain x 2 days with urinary frequency  then today started having n/d. Patient had about 5 BMs today.  Patient denies foul odors, states that burning little when urinates.

## 2015-11-28 NOTE — ED Notes (Signed)
MD at bedside. 

## 2015-11-29 ENCOUNTER — Emergency Department (HOSPITAL_COMMUNITY): Payer: Self-pay

## 2015-11-29 ENCOUNTER — Emergency Department (HOSPITAL_COMMUNITY)
Admission: EM | Admit: 2015-11-29 | Discharge: 2015-11-29 | Disposition: A | Discharge status: Home / Self Care | Payer: Self-pay | Attending: Emergency Medicine | Admitting: Emergency Medicine

## 2015-11-29 ENCOUNTER — Encounter (HOSPITAL_COMMUNITY): Payer: Self-pay | Admitting: Emergency Medicine

## 2015-11-29 DIAGNOSIS — R1011 Right upper quadrant pain: Secondary | ICD-10-CM | POA: Insufficient documentation

## 2015-11-29 DIAGNOSIS — R109 Unspecified abdominal pain: Secondary | ICD-10-CM

## 2015-11-29 DIAGNOSIS — F17210 Nicotine dependence, cigarettes, uncomplicated: Secondary | ICD-10-CM | POA: Insufficient documentation

## 2015-11-29 DIAGNOSIS — R110 Nausea: Secondary | ICD-10-CM | POA: Insufficient documentation

## 2015-11-29 DIAGNOSIS — E039 Hypothyroidism, unspecified: Secondary | ICD-10-CM | POA: Insufficient documentation

## 2015-11-29 MED ORDER — KETOROLAC TROMETHAMINE 15 MG/ML IJ SOLN
15.0000 mg | Freq: Once | INTRAMUSCULAR | Status: AC
  Administered 2015-11-29: 15 mg via INTRAVENOUS
  Filled 2015-11-29: qty 1

## 2015-11-29 MED ORDER — HYDROMORPHONE HCL 1 MG/ML IJ SOLN
1.0000 mg | Freq: Once | INTRAMUSCULAR | Status: AC
  Administered 2015-11-29: 1 mg via INTRAVENOUS
  Filled 2015-11-29: qty 1

## 2015-11-29 MED ORDER — HYDROCODONE-ACETAMINOPHEN 5-325 MG PO TABS
2.0000 | ORAL_TABLET | Freq: Once | ORAL | Status: AC
  Administered 2015-11-29: 2 via ORAL
  Filled 2015-11-29: qty 2

## 2015-11-29 MED ORDER — IBUPROFEN 600 MG PO TABS: 600.0000 mg | ORAL_TABLET | Freq: Four times a day (QID) | ORAL | 0 refills | Status: DC | PRN

## 2015-11-29 NOTE — ED Provider Notes (Addendum)
[facility]-EMERGENCY DEPT Provider Note   CSN: [id number] Arrival date & time: 11/29/15  1414     History   Chief Complaint Chief Complaint  Patient presents with  . Flank Pain    HPI Taylor Donovan is a 25 y.o. female.  HPI Pt comes in with cc of abd pain. Pt has hx of PCOS. She reports that she started having flank pain on the R side on Sunday. Overtime, the pain has become more constant and severe. Pt was seen yday - and it appears that she had a normal bedside US and UA and basic labs. PT reports that the pain continued to get worse, so she came to the ER. Pt has associated nausea. Pt has some tingling with urination, but no pain/burning. Pt has no vaginal discharge, no polyuria, hematuria.   Past Medical History:  Diagnosis Date  . Acid reflux   . Depression   . Hypothyroid   . Ovarian cyst   . PCOS (polycystic ovarian syndrome)   . Thyroid disease     Patient Active Problem List   Diagnosis Date Noted  . HASHIMOTO'S THYROIDITIS 08/13/2009  . DYSPNEA ON EXERTION 08/13/2009    History reviewed. No pertinent surgical history.  OB History    No data available       Home Medications    Prior to Admission medications   Medication Sig Start Date End Date Taking? Authorizing Provider  VIENVA 0.1-20 MG-MCG tablet Take 1 tablet by mouth daily. 11/08/15  Yes Historical Provider, MD  ibuprofen (ADVIL,MOTRIN) 600 MG tablet Take 1 tablet (600 mg total) by mouth every 6 (six) hours as needed. 11/29/15   Derwood Kaplan, MD  loperamide (IMODIUM) 2 MG capsule Take 1 capsule (2 mg total) by mouth 4 (four) times daily as needed for diarrhea or loose stools. Patient not taking: Reported on 11/28/2015 08/08/15   Joycie Peek, PA-C  methocarbamol (ROBAXIN) 500 MG tablet Take 1 tablet (500 mg total) by mouth 2 (two) times daily. Patient not taking: Reported on 11/28/2015 08/01/15   Fayrene Helper, PA-C  ondansetron (ZOFRAN) 4 MG tablet Take 1 tablet (4 mg total) by mouth every 6 (six)  hours. Patient not taking: Reported on 11/28/2015 08/08/15   Joycie Peek, PA-C    Family History No family history on file.  Social History Social History  Substance Use Topics  . Smoking status: Current Every Day Smoker    Packs/day: 0.50    Years: 8.00    Types: Cigarettes  . Smokeless tobacco: Never Used  . Alcohol use Yes     Comment: occasionally     Allergies   Review of patient's allergies indicates no known allergies.   Review of Systems Review of Systems  ROS 10 Systems reviewed and are negative for acute change except as noted in the HPI.     Physical Exam Updated Vital Signs BP 140/73 (BP Location: Left Arm)   Pulse 90   Temp 98 F (36.7 C) (Axillary)   Resp 20   Ht 5\' 7"  (1.702 m)   Wt 220 lb (99.8 kg)   LMP 11/08/2015   SpO2 100%   BMI 34.46 kg/m   Physical Exam  Constitutional: She is oriented to person, place, and time. She appears well-developed and well-nourished.  HENT:  Head: Normocephalic and atraumatic.  Eyes: EOM are normal. Pupils are equal, round, and reactive to light.  Neck: Neck supple.  Cardiovascular: Normal rate, regular rhythm and normal heart sounds.   No murmur heard.  Pulmonary/Chest: Effort normal. No respiratory distress.  Abdominal: Soft. She exhibits no distension. There is tenderness. There is no rebound and no guarding.  R flank tenderness, RUQ tenderness. Neg Murphy's sign.  Neurological: She is alert and oriented to person, place, and time.  Skin: Skin is warm and dry.  Nursing note and vitals reviewed.    ED Treatments / Results  Labs (all labs ordered are listed, but only abnormal results are displayed) Labs Reviewed - No data to display  EKG  EKG Interpretation None       Radiology No results found.  Procedures Procedures (including critical care time)  Medications Ordered in ED Medications  HYDROmorphone (DILAUDID) injection 1 mg (1 mg Intravenous Given 11/29/15 1536)    HYDROcodone-acetaminophen (NORCO/VICODIN) 5-325 MG per tablet 2 tablet (2 tablets Oral Given 11/29/15 1805)  ketorolac (TORADOL) 15 MG/ML injection 15 mg (15 mg Intravenous Given 11/29/15 1808)     Initial Impression / Assessment and Plan / ED Course  I have reviewed the triage vital signs and the nursing notes.  Pertinent labs & imaging results that were available during my care of the patient were reviewed by me and considered in my medical decision making (see chart for details).  Clinical Course  Comment By Time  Pt's US pelvis was neg. Repeat exam revealed persistent Rt flank tenderness and RUQ tenderness. Neg Mcburneys. Plan was to get US RUQ. If neg, pt will go home. She will return to the ER if the symptoms continue to get worse and we might consider CT at that time. Derwood KaplanAnkit Johntavious Francom, MD 08/31 1759    DDx includes: Pancreatitis Hepatobiliary pathology including cholecystitis Gastritis/PUD SBO Tumors Colitis Intra abdominal abscess Thrombosis Appendicitis Hernia Nephrolithiasis Pyelonephritis UTI/Cystitis Ovarian cyst TOA Ectopic pregnancy PID STD torsion  Pt comes in with cc of flank pain. Flank pain is new and getting worse. She has hx of PCOS. Eval yday revealed normal CNC, CMP. Lipase wasn't ordered, but there is no epigastric pain or tenderness. POC US by EDP was neg for obstructive uropathy, UA was clear. Pt returns as pain is getting worse. Her exam reveals focal R sided tenderness, worst In the flank region. Given PCOS, we will get US torsion. Will reassess.   Final Clinical Impressions(s) / ED Diagnoses   Final diagnoses:  Flank pain, acute  RUQ abdominal pain  Right flank pain    New Prescriptions Discharge Medication List as of 11/29/2015  6:20 PM       Derwood KaplanAnkit Etola Mull, MD 11/29/15 [id number]    Derwood KaplanAnkit Jarrad Mclees, MD 12/27/15 1343

## 2015-11-29 NOTE — Discharge Instructions (Signed)
Please take the medicine provided.  We saw you in the ER for the chest pain/shortness of breath. All of our cardiac workup is normal, including labs, EKG and chest X-RAY are normal. We are not sure what is causing your discomfort, but we feel comfortable sending you home at this time.   Return to the ER if the symptoms are getting worse - worsening abd pain, nausea, fevers, bloody stools/emesis, sweats associated with severe pain, severe pain with urination.

## 2015-11-29 NOTE — ED Triage Notes (Signed)
Pt complaining of right sided flank pain radiating into back for approx 3 days. Also reports nausea and diarrhea, tingling and frequency with urination. Pt states that she was seen here yesterday and all test results were negative. Reports sx are worse today.

## 2015-11-30 LAB — URINE CULTURE: CULTURE: NO GROWTH

## 2016-01-07 ENCOUNTER — Emergency Department (HOSPITAL_COMMUNITY): Payer: Medicaid Other

## 2016-01-07 ENCOUNTER — Encounter (HOSPITAL_COMMUNITY): Payer: Self-pay

## 2016-01-07 ENCOUNTER — Inpatient Hospital Stay (HOSPITAL_COMMUNITY)
Admission: EM | Admit: 2016-01-07 | Discharge: 2016-01-09 | DRG: 123 | Disposition: A | Discharge status: Home / Self Care | Payer: Medicaid Other | Attending: Internal Medicine | Admitting: Internal Medicine

## 2016-01-07 DIAGNOSIS — Z23 Encounter for immunization: Secondary | ICD-10-CM

## 2016-01-07 DIAGNOSIS — E039 Hypothyroidism, unspecified: Secondary | ICD-10-CM

## 2016-01-07 DIAGNOSIS — F17210 Nicotine dependence, cigarettes, uncomplicated: Secondary | ICD-10-CM | POA: Diagnosis present

## 2016-01-07 DIAGNOSIS — E538 Deficiency of other specified B group vitamins: Secondary | ICD-10-CM | POA: Diagnosis present

## 2016-01-07 DIAGNOSIS — R9089 Other abnormal findings on diagnostic imaging of central nervous system: Secondary | ICD-10-CM

## 2016-01-07 DIAGNOSIS — H469 Unspecified optic neuritis: Principal | ICD-10-CM | POA: Diagnosis present

## 2016-01-07 DIAGNOSIS — K219 Gastro-esophageal reflux disease without esophagitis: Secondary | ICD-10-CM | POA: Diagnosis present

## 2016-01-07 DIAGNOSIS — H5462 Unqualified visual loss, left eye, normal vision right eye: Secondary | ICD-10-CM | POA: Diagnosis present

## 2016-01-07 DIAGNOSIS — E063 Autoimmune thyroiditis: Secondary | ICD-10-CM | POA: Diagnosis present

## 2016-01-07 LAB — CBC WITH DIFFERENTIAL/PLATELET
BASOS ABS: 0 10*3/uL (ref 0.0–0.1)
BASOS PCT: 0 %
EOS ABS: 0.1 10*3/uL (ref 0.0–0.7)
Eosinophils Relative: 1 %
HCT: 37.3 % (ref 36.0–46.0)
Hemoglobin: 12.4 g/dL (ref 12.0–15.0)
LYMPHS ABS: 2.9 10*3/uL (ref 0.7–4.0)
LYMPHS PCT: 37 %
MCH: 29.2 pg (ref 26.0–34.0)
MCHC: 33.2 g/dL (ref 30.0–36.0)
MCV: 88 fL (ref 78.0–100.0)
MONOS PCT: 6 %
Monocytes Absolute: 0.5 10*3/uL (ref 0.1–1.0)
NEUTROS ABS: 4.4 10*3/uL (ref 1.7–7.7)
NEUTROS PCT: 56 %
PLATELETS: 268 10*3/uL (ref 150–400)
RBC: 4.24 MIL/uL (ref 3.87–5.11)
RDW: 11.8 % (ref 11.5–15.5)
WBC: 7.9 10*3/uL (ref 4.0–10.5)

## 2016-01-07 LAB — SEDIMENTATION RATE: SED RATE: 10 mm/h (ref 0–22)

## 2016-01-07 LAB — COMPREHENSIVE METABOLIC PANEL
ALBUMIN: 3.9 g/dL (ref 3.5–5.0)
ALT: 19 U/L (ref 14–54)
ANION GAP: 7 (ref 5–15)
AST: 15 U/L (ref 15–41)
Alkaline Phosphatase: 64 U/L (ref 38–126)
BUN: 7 mg/dL (ref 6–20)
CHLORIDE: 106 mmol/L (ref 101–111)
CO2: 26 mmol/L (ref 22–32)
Calcium: 9.1 mg/dL (ref 8.9–10.3)
Creatinine, Ser: 0.75 mg/dL (ref 0.44–1.00)
GFR calc Af Amer: >60 mL/min (ref >60)
GFR calc non Af Amer: >60 mL/min (ref >60)
GLUCOSE: 98 mg/dL (ref 65–99)
POTASSIUM: 3.8 mmol/L (ref 3.5–5.1)
SODIUM: 139 mmol/L (ref 135–145)
TOTAL PROTEIN: 6.7 g/dL (ref 6.5–8.1)
Total Bilirubin: 0.2 mg/dL — ABNORMAL LOW (ref 0.3–1.2)

## 2016-01-07 LAB — C-REACTIVE PROTEIN

## 2016-01-07 LAB — I-STAT BETA HCG BLOOD, ED (MC, WL, AP ONLY): I-stat hCG, quantitative: <5 m[IU]/mL (ref <5)

## 2016-01-07 MED ORDER — GADOBENATE DIMEGLUMINE 529 MG/ML IV SOLN
20.0000 mL | Freq: Once | INTRAVENOUS | Status: AC | PRN
  Administered 2016-01-07: 20 mL via INTRAVENOUS

## 2016-01-07 MED ORDER — SODIUM CHLORIDE 0.9 % IV SOLN
1000.0000 mg | Freq: Once | INTRAVENOUS | Status: AC
  Administered 2016-01-07: 1000 mg via INTRAVENOUS
  Filled 2016-01-07: qty 8

## 2016-01-07 MED ORDER — KETOROLAC TROMETHAMINE 30 MG/ML IJ SOLN
30.0000 mg | Freq: Once | INTRAMUSCULAR | Status: AC
  Administered 2016-01-07: 30 mg via INTRAVENOUS
  Filled 2016-01-07: qty 1

## 2016-01-07 NOTE — ED Provider Notes (Signed)
[facility] Provider Note   CSN: [id number] Arrival date & time: 01/07/16  1727     History   Chief Complaint Chief Complaint  Patient presents with  . Loss of Vision    HPI Taylor Donovan is a 25 y.o. female.  25yo F w/ h/o Hashimoto's thyroiditis and PCOS who presents with left eye vision loss. 3 days ago, the patient had a gradual onset of headache and itchy, watery eyes. She thought that it was allergies and so she started taking allergy medication. The next morning she woke up and had vision loss in her left eye. She describes it as a dark spot of visual loss in her central visual field of left eye and peripheral blurriness. She also reports pain with extraocular movements of left eye. No problems with right eye. She was seen by ophthalmology, Dr. Lavona Mound, who sent her here for imaging. She denies any head traumas, vomiting, diarrhea, fevers, cough/cold symptoms, or recent illness. She denies any balance problems, extremity numbness/weakness. No family history of neurologic problems or autoimmune disease.      Past Medical History:  Diagnosis Date  . Acid reflux   . Depression   . Hypothyroid   . Ovarian cyst   . PCOS (polycystic ovarian syndrome)   . Thyroid disease     Patient Active Problem List   Diagnosis Date Noted  . HASHIMOTO'S THYROIDITIS 08/13/2009  . DYSPNEA ON EXERTION 08/13/2009    History reviewed. No pertinent surgical history.  OB History    No data available       Home Medications    Prior to Admission medications   Medication Sig Start Date End Date Taking? Authorizing Provider  ibuprofen (ADVIL,MOTRIN) 600 MG tablet Take 1 tablet (600 mg total) by mouth every 6 (six) hours as needed. 11/29/15   Derwood Kaplan, MD  loperamide (IMODIUM) 2 MG capsule Take 1 capsule (2 mg total) by mouth 4 (four) times daily as needed for diarrhea or loose stools. Patient not taking: Reported on 11/28/2015 08/08/15   Joycie Peek, PA-C  methocarbamol  (ROBAXIN) 500 MG tablet Take 1 tablet (500 mg total) by mouth 2 (two) times daily. Patient not taking: Reported on 11/28/2015 08/01/15   Fayrene Helper, PA-C  ondansetron (ZOFRAN) 4 MG tablet Take 1 tablet (4 mg total) by mouth every 6 (six) hours. Patient not taking: Reported on 11/28/2015 08/08/15   Joycie Peek, PA-C  VIENVA 0.1-20 MG-MCG tablet Take 1 tablet by mouth daily. 11/08/15   Historical Provider, MD    Family History History reviewed. No pertinent family history.  Social History Social History  Substance Use Topics  . Smoking status: Current Every Day Smoker    Packs/day: 0.50    Years: 8.00    Types: Cigarettes  . Smokeless tobacco: Never Used  . Alcohol use Yes     Comment: occasionally     Allergies   Review of patient's allergies indicates no known allergies.   Review of Systems Review of Systems 10 Systems reviewed and are negative for acute change except as noted in the HPI.   Physical Exam Updated Vital Signs BP 135/81 (BP Location: Right Arm)   Pulse 78   Temp 98.2 F (36.8 C) (Oral)   Resp 16   Ht 5\' 7"  (1.702 m)   Wt 215 lb (97.5 kg)   LMP 01/06/2016   SpO2 99%   BMI 33.67 kg/m   Physical Exam  Constitutional: She is oriented to person, place, and time. She  appears well-developed and well-nourished. No distress.  Awake, alert  HENT:  Head: Normocephalic and atraumatic.  Eyes: Conjunctivae and EOM are normal. Right eye exhibits no discharge. Left eye exhibits no discharge.  Pupils dilated but responsive to light, APD L eye  Neck: Neck supple.  Cardiovascular: Normal rate, regular rhythm and normal heart sounds.   No murmur heard. Pulmonary/Chest: Effort normal and breath sounds normal. No respiratory distress.  Abdominal: Soft. Bowel sounds are normal. She exhibits no distension. There is no tenderness.  Musculoskeletal: She exhibits no edema.  Neurological: She is alert and oriented to person, place, and time. No cranial nerve deficit. She  exhibits normal muscle tone.  L eye central visual field loss and peripheral field visual impairment w/ ability to see only movement in L eye visual fields; R eye visual fields intact; the remainder of CN exam normal  Fluent speech, normal finger-to-nose testing, negative pronator drift 5/5 strength and normal sensation x all 4 extremities  Skin: Skin is warm and dry.  Psychiatric: She has a normal mood and affect. Judgment and thought content normal.  Nursing note and vitals reviewed.    ED Treatments / Results  Labs (all labs ordered are listed, but only abnormal results are displayed) Labs Reviewed  COMPREHENSIVE METABOLIC PANEL - Abnormal; Notable for the following:       Result Value   Total Bilirubin 0.2 (*)    All other components within normal limits  CBC WITH DIFFERENTIAL/PLATELET  SEDIMENTATION RATE  C-REACTIVE PROTEIN  LYME DISEASE DNA BY PCR([facility]ORRELIA [facility]URG)  RPR  FLUORESCENT TREPONEMAL AB(FTA)-IGG-BLD  I-STAT BETA HCG BLOOD, ED ([facility], [facility], [facility] ONLY)    EKG  EKG Interpretation None       Radiology Dg Chest 2 View  Result Date: 01/07/2016 CLINICAL DATA:  Headache dizziness dark spot left vision EXAM: CHEST  2 VIEW COMPARISON:  08/15/2014 FINDINGS: The heart size and mediastinal contours are within normal limits. Both lungs are clear. The visualized skeletal structures are unremarkable. IMPRESSION: No active cardiopulmonary disease. Electronically Signed   By: Jasmine Pang M.D.   On: 01/07/2016 19:17   Mr Laqueta Jean And Wo Contrast  Result Date: 01/07/2016 CLINICAL DATA:  Initial evaluation for acute headache with dizziness. Dark spot and left vision. EXAM: MRI HEAD AND ORBITS WITHOUT AND WITH CONTRAST TECHNIQUE: Multiplanar, multiecho pulse sequences of the brain and surrounding structures were obtained without and with intravenous contrast. Multiplanar, multiecho pulse sequences of the orbits and surrounding structures were obtained including fat saturation techniques,  before and after intravenous contrast administration. CONTRAST:  20 cc of MultiHance. COMPARISON:  None. FINDINGS: MRI HEAD FINDINGS Cerebral volume within normal limits for patient age. Few patchy T2/FLAIR hyperintense foci noted within the periventricular, deep, and subcortical white matter both cerebral hemispheres, nonspecific, but somewhat advanced for patient age. A single left periatrial lesion demonstrates hypo intense T1 signal intensity (series 9, image 28). No abnormal foci of restricted diffusion to suggest acute or subacute ischemia. Gray-white matter differentiation maintained. Major intracranial vascular flow voids are well preserved. No acute or chronic intracranial hemorrhage. No areas chronic infarction. No mass lesion, midline shift, or mass effect. No hydrocephalus. No extra-axial fluid collection. Major dural sinuses are grossly patent. No abnormal enhancement. Craniocervical junction within normal limits. Pituitary gland normal. Paranasal sinuses are clear. No mastoid effusion. Inner ear structures are normal. Bone marrow signal intensity within normal limits. No scalp soft tissue abnormality. MRI ORBITS FINDINGS Dedicated views of the orbits were performed. Globes are  not symmetric in size with normal appearance. Extra-ocular muscles are symmetric and normal in appearance. Lacrimal glands are normal. Optic nerves are symmetric bilaterally without edema or abnormal enhancement. Optic chiasm normal. Superior ophthalmic vein symmetric and within normal limits bilaterally. IMPRESSION: 1. Mild patchy T2/FLAIR hyperintense foci involving the periventricular, deep, and subcortical white matter of both cerebral hemispheres. While these foci are nonspecific, these are felt to be relatively increased for patient age. Given the appearance and distribution of a few of these lesions, possible underlying demyelinating disease could be considered. No evidence for active demyelination on this exam.  Alternative differential considerations include accelerated chronic small vessel ischemic disease, sequela of underlying migrainous disorder, or possibly vasculitis. 2. Otherwise normal brain MRI. 3. Normal MRI appearance of the orbits. Electronically Signed   By: Rise MuBenjamin  McClintock M.D.   On: 01/07/2016 22:19   Mr Birdie HopesOrbits Wo/w Cm  Result Date: 01/07/2016 CLINICAL DATA:  Initial evaluation for acute headache with dizziness. Dark spot and left vision. EXAM: MRI HEAD AND ORBITS WITHOUT AND WITH CONTRAST TECHNIQUE: Multiplanar, multiecho pulse sequences of the brain and surrounding structures were obtained without and with intravenous contrast. Multiplanar, multiecho pulse sequences of the orbits and surrounding structures were obtained including fat saturation techniques, before and after intravenous contrast administration. CONTRAST:  20 cc of MultiHance. COMPARISON:  None. FINDINGS: MRI HEAD FINDINGS Cerebral volume within normal limits for patient age. Few patchy T2/FLAIR hyperintense foci noted within the periventricular, deep, and subcortical white matter both cerebral hemispheres, nonspecific, but somewhat advanced for patient age. A single left periatrial lesion demonstrates hypo intense T1 signal intensity (series 9, image 28). No abnormal foci of restricted diffusion to suggest acute or subacute ischemia. Gray-white matter differentiation maintained. Major intracranial vascular flow voids are well preserved. No acute or chronic intracranial hemorrhage. No areas chronic infarction. No mass lesion, midline shift, or mass effect. No hydrocephalus. No extra-axial fluid collection. Major dural sinuses are grossly patent. No abnormal enhancement. Craniocervical junction within normal limits. Pituitary gland normal. Paranasal sinuses are clear. No mastoid effusion. Inner ear structures are normal. Bone marrow signal intensity within normal limits. No scalp soft tissue abnormality. MRI ORBITS FINDINGS Dedicated  views of the orbits were performed. Globes are not symmetric in size with normal appearance. Extra-ocular muscles are symmetric and normal in appearance. Lacrimal glands are normal. Optic nerves are symmetric bilaterally without edema or abnormal enhancement. Optic chiasm normal. Superior ophthalmic vein symmetric and within normal limits bilaterally. IMPRESSION: 1. Mild patchy T2/FLAIR hyperintense foci involving the periventricular, deep, and subcortical white matter of both cerebral hemispheres. While these foci are nonspecific, these are felt to be relatively increased for patient age. Given the appearance and distribution of a few of these lesions, possible underlying demyelinating disease could be considered. No evidence for active demyelination on this exam. Alternative differential considerations include accelerated chronic small vessel ischemic disease, sequela of underlying migrainous disorder, or possibly vasculitis. 2. Otherwise normal brain MRI. 3. Normal MRI appearance of the orbits. Electronically Signed   By: Rise MuBenjamin  McClintock M.D.   On: 01/07/2016 22:19    Procedures Procedures (including critical care time)  Medications Ordered in ED Medications  methylPREDNISolone sodium succinate (SOLU-MEDROL) 1,000 mg in sodium chloride 0.9 % 50 mL IVPB (not administered)  ketorolac (TORADOL) 30 MG/ML injection 30 mg (not administered)  gadobenate dimeglumine (MULTIHANCE) injection 20 mL (20 mLs Intravenous Contrast Given 01/07/16 2043)     Initial Impression / Assessment and Plan / ED Course  I have reviewed  the triage vital signs and the nursing notes.  Pertinent labs & imaging results that were available during my care of the patient were reviewed by me and considered in my medical decision making (see chart for details).  Clinical Course   Pt with 3 days of left eye vision loss and L eye pain. Well-appearing on exam with reassuring vital signs. Neuro exam as above.  In his note, Dr.  Lavona Mound notes normal IOP b/l, afferent pupillary defect on left, and mild papillitis on L. Of brain and orbits per his recommendations, I ordered an MRI of brain and orbits as well as above labs.  Labwork that has resulted has been unremarkable including negative inflammatory markers. Chest x-ray negative. MRI shows patchy FLAIR hyperintense foci scattered, nonspecific but may suggest demyelinating disease. I reviewed these images with neurology, Dr. Roxy Manns, appreciate his assistance with patient's care. Per his recs, I have given 1g solumedrol and discussed admission with Dr. Montez Morita and pt admitted for further care.  Final Clinical Impressions(s) / ED Diagnoses   Final diagnoses:  Vision loss of left eye    New Prescriptions New Prescriptions   No medications on file     Laurence Spates, MD 01/08/16 [phone]

## 2016-01-07 NOTE — ED Notes (Signed)
Spoke to Dr. Clarene Duke about pt.  Made aware of MD's office assessment.  Will speak to pt directly before ordering MRI

## 2016-01-07 NOTE — ED Triage Notes (Signed)
Pt states seen by Grout MD, sent here for MRI follow up. Pt complaining of headache and dizziness. Pt states dark spot in left vision. Pt states some fuzzy peripheral vision in L eye.

## 2016-01-08 ENCOUNTER — Encounter (HOSPITAL_COMMUNITY): Payer: Self-pay | Admitting: *Deleted

## 2016-01-08 DIAGNOSIS — R9089 Other abnormal findings on diagnostic imaging of central nervous system: Secondary | ICD-10-CM

## 2016-01-08 DIAGNOSIS — E039 Hypothyroidism, unspecified: Secondary | ICD-10-CM

## 2016-01-08 DIAGNOSIS — G35 Multiple sclerosis: Secondary | ICD-10-CM

## 2016-01-08 DIAGNOSIS — H469 Unspecified optic neuritis: Principal | ICD-10-CM

## 2016-01-08 DIAGNOSIS — E063 Autoimmune thyroiditis: Secondary | ICD-10-CM

## 2016-01-08 DIAGNOSIS — H5462 Unqualified visual loss, left eye, normal vision right eye: Secondary | ICD-10-CM | POA: Diagnosis present

## 2016-01-08 DIAGNOSIS — E038 Other specified hypothyroidism: Secondary | ICD-10-CM

## 2016-01-08 LAB — VITAMIN B12: VITAMIN B 12: 153 pg/mL — AB (ref 180–914)

## 2016-01-08 LAB — RPR: RPR Ser Ql: NONREACTIVE

## 2016-01-08 LAB — CREATININE, SERUM
Creatinine, Ser: 0.85 mg/dL (ref 0.44–1.00)
GFR calc Af Amer: >60 mL/min (ref >60)
GFR calc non Af Amer: >60 mL/min (ref >60)

## 2016-01-08 LAB — CBC
HCT: 40.4 % (ref 36.0–46.0)
Hemoglobin: 13.6 g/dL (ref 12.0–15.0)
MCH: 29.1 pg (ref 26.0–34.0)
MCHC: 33.7 g/dL (ref 30.0–36.0)
MCV: 86.3 fL (ref 78.0–100.0)
PLATELETS: 309 10*3/uL (ref 150–400)
RBC: 4.68 MIL/uL (ref 3.87–5.11)
RDW: 11.5 % (ref 11.5–15.5)
WBC: 8.2 10*3/uL (ref 4.0–10.5)

## 2016-01-08 LAB — T4, FREE: Free T4: 0.92 ng/dL (ref 0.61–1.12)

## 2016-01-08 LAB — HIV ANTIBODY (ROUTINE TESTING W REFLEX): HIV SCREEN 4TH GENERATION: NONREACTIVE

## 2016-01-08 LAB — TSH: TSH: 6.421 u[IU]/mL — ABNORMAL HIGH (ref 0.350–4.500)

## 2016-01-08 MED ORDER — ACETAMINOPHEN 650 MG RE SUPP: 650.0000 mg | Freq: Four times a day (QID) | RECTAL | Status: DC | PRN

## 2016-01-08 MED ORDER — ONDANSETRON HCL 4 MG PO TABS
4.0000 mg | ORAL_TABLET | Freq: Four times a day (QID) | ORAL | Status: DC | PRN
  Administered 2016-01-08: 4 mg via ORAL
  Filled 2016-01-08: qty 1

## 2016-01-08 MED ORDER — INFLUENZA VAC SPLIT QUAD 0.5 ML IM SUSY
0.5000 mL | PREFILLED_SYRINGE | INTRAMUSCULAR | Status: AC
  Administered 2016-01-09: 0.5 mL via INTRAMUSCULAR
  Filled 2016-01-08: qty 0.5

## 2016-01-08 MED ORDER — CYANOCOBALAMIN 1000 MCG/ML IJ SOLN
1000.0000 ug | Freq: Once | INTRAMUSCULAR | Status: AC
  Administered 2016-01-08: 1000 ug via INTRAMUSCULAR
  Filled 2016-01-08: qty 1

## 2016-01-08 MED ORDER — ACETAMINOPHEN 325 MG PO TABS
650.0000 mg | ORAL_TABLET | Freq: Four times a day (QID) | ORAL | Status: DC | PRN
  Administered 2016-01-08: 650 mg via ORAL
  Filled 2016-01-08: qty 2

## 2016-01-08 MED ORDER — TRAMADOL HCL 50 MG PO TABS
100.0000 mg | ORAL_TABLET | Freq: Four times a day (QID) | ORAL | Status: DC | PRN
  Administered 2016-01-08: 100 mg via ORAL
  Filled 2016-01-08: qty 2

## 2016-01-08 MED ORDER — SODIUM CHLORIDE 0.9 % IV SOLN
1000.0000 mg | INTRAVENOUS | Status: AC
  Administered 2016-01-08 – 2016-01-09 (×2): 1000 mg via INTRAVENOUS
  Filled 2016-01-08 (×2): qty 8

## 2016-01-08 MED ORDER — PNEUMOCOCCAL VAC POLYVALENT 25 MCG/0.5ML IJ INJ
0.5000 mL | INJECTION | INTRAMUSCULAR | Status: AC
  Administered 2016-01-09: 0.5 mL via INTRAMUSCULAR
  Filled 2016-01-08: qty 0.5

## 2016-01-08 MED ORDER — ONDANSETRON HCL 4 MG/2ML IJ SOLN: 4.0000 mg | Freq: Four times a day (QID) | INTRAMUSCULAR | Status: DC | PRN

## 2016-01-08 MED ORDER — KETOROLAC TROMETHAMINE 30 MG/ML IJ SOLN
30.0000 mg | Freq: Three times a day (TID) | INTRAMUSCULAR | Status: DC | PRN
  Administered 2016-01-08: 30 mg via INTRAVENOUS
  Filled 2016-01-08: qty 1

## 2016-01-08 MED ORDER — NICOTINE 14 MG/24HR TD PT24
14.0000 mg | MEDICATED_PATCH | Freq: Every day | TRANSDERMAL | Status: DC
  Administered 2016-01-08 – 2016-01-09 (×2): 14 mg via TRANSDERMAL
  Filled 2016-01-08 (×2): qty 1

## 2016-01-08 MED ORDER — LIDOCAINE 5 % EX PTCH
1.0000 | MEDICATED_PATCH | CUTANEOUS | Status: DC
  Administered 2016-01-08: 1 via TRANSDERMAL
  Filled 2016-01-08: qty 1

## 2016-01-08 MED ORDER — CYANOCOBALAMIN 1000 MCG/ML IJ SOLN: 1000.0000 ug | Freq: Every day | INTRAMUSCULAR | Status: DC

## 2016-01-08 MED ORDER — ENOXAPARIN SODIUM 40 MG/0.4ML ~~LOC~~ SOLN
40.0000 mg | Freq: Every day | SUBCUTANEOUS | Status: DC
  Administered 2016-01-08 – 2016-01-09 (×2): 40 mg via SUBCUTANEOUS
  Filled 2016-01-08 (×2): qty 0.4

## 2016-01-08 NOTE — Progress Notes (Signed)
Patient complained of headache, backache radiating to bilateral legs. Tylenol PRN given with relief noted. Patient still complains of dull pain to lower back.

## 2016-01-08 NOTE — Progress Notes (Addendum)
PROGRESS NOTE    Taylor Donovan  ION:[id number] DOB: 08-06-1990 DOA: 01/07/2016 PCP: Elizabeth Palau, FNP   Brief Narrative:  Taylor Donovan is a 25 y.o. woman with a history of PCOS and Hashimoto's thyroiditis S/P radioactive ablation (she is supposed to be on levothyroxine but says that she could not afford it) who presents to the ED after being referred by Dr. Lavona Mound of ophthamology.  The patient developed acute vision changes over the weekend, involving the left eye.  She continued to work through the weekend (she is employed at [employer], [place]) and presented for outpatient evaluation today.  She has had associated headaches and dizziness.  Dr. Lavona Mound was concerned that the patient's exam was consistent with optic neuritis, and she was referred to the ED for further evaluation, particularly to rule out MS.  Unfortunately, the patient has an abnormal MRI of the brain, with white matter changes in bilateral cerebral hemispheres.  Neurology has seen the patient in the ED.  There is concern that her presentation is consistent with MS.  She has received IV solumedrol 1 gram in the ED.  Hospitalist asked to admit.   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 --Neurology following --Extensive serological work-up pending as recommended by neurology and ophthamology --IV solumedrol 1 gram x three days, first given in the ED --Follow up further neurology recommendations; she will need close outpatient follow up - B12 level of 153- vitamin B12 injection ordered - no change in vision reported today   History of untreated hypothyroidism -- T4 WNL - TSH of 6.4 - elevation could be conjunction of hypothyroidism and acute illness - will discuss with patient starting levothyroxine  Active tobacco use --Will order nicotine  patch - cessation strongly encouraged   DVT prophylaxis: early ambulation, lovenox Code Status: Full Code Family Communication: no family bedside- patient states her boyfriend just went home to get more clothes but she can relay information to him Disposition Plan: home after IV steroids x 3 days   Consultants:   Neurology  Procedures:   none  Antimicrobials:   none    Subjective: Patient see bedside.  She is sitting in bed and reading the book "It".  She reports no improvement in her vision at this time.  She says she did ask her mother about any family history of MS yesterday but there is not reported family history.  Patient says she has been ambulating well as well as eating well.  No concerns noted in terms of shortness of breath, increased work of breathing, abdominal pain, nausea, vomiting, diarrhea.  A minor headache is reported.    Objective: Vitals:   01/07/16 2047 01/08/16 0125 01/08/16 0522 01/08/16 1010  BP: 135/81 140/77 127/83 138/84  Pulse: 78 72 86 84  Resp: 16 16 18 17   Temp: 98.2 F (36.8 C) 98.3 F (36.8 C) 98.2 F (36.8 C) 98.3 F (36.8 C)  TempSrc: Oral Oral Oral Oral  SpO2: 99% 97% 98% 98%  Weight:  104 kg (229 lb 3.2 oz)    Height:  5\' 7"  (1.702 m)     No intake or output data in the 24 hours ending 01/08/16 1013 Filed Weights   01/07/16 1741 01/08/16 0125  Weight: 97.5 kg (215 lb) 104 kg (229 lb 3.2 oz)  Examination:  General exam: Appears calm and comfortable  Respiratory system: Clear to auscultation. Respiratory effort normal. Cardiovascular system: S1 & S2 heard, RRR. No JVD, murmurs, rubs, gallops or clicks. No pedal edema. Gastrointestinal system: Abdomen is nondistended, soft and nontender. No organomegaly or masses felt. Normal bowel sounds heard. Central nervous system: Alert and oriented. No neurological deficits noted on exam except for decreased vision in the left eye Extremities: Symmetric 5 x 5 power. Skin: No rashes,  lesions or ulcers Psychiatry: Judgement and insight appear normal. Mood & affect appropriate.     Data Reviewed: I have personally reviewed following labs and imaging studies  CBC:  Recent Labs Lab 01/07/16 1832  WBC 7.9  NEUTROABS 4.4  HGB 12.4  HCT 37.3  MCV 88.0  PLT 268   Basic Metabolic Panel:  Recent Labs Lab 01/07/16 1832  NA 139  K 3.8  CL 106  CO2 26  GLUCOSE 98  BUN 7  CREATININE 0.75  CALCIUM 9.1   GFR: Estimated Creatinine Clearance: 132.2 mL/min (by C-G formula based on SCr of 0.75 mg/dL). Liver Function Tests:  Recent Labs Lab 01/07/16 1832  AST 15  ALT 19  ALKPHOS 64  BILITOT 0.2*  PROT 6.7  ALBUMIN 3.9   No results for input(s): LIPASE, AMYLASE in the last 168 hours. No results for input(s): AMMONIA in the last 168 hours. Coagulation Profile: No results for input(s): INR, PROTIME in the last 168 hours. Cardiac Enzymes: No results for input(s): CKTOTAL, CKMB, CKMBINDEX, TROPONINI in the last 168 hours. BNP (last 3 results) No results for input(s): PROBNP in the last 8760 hours. HbA1C: No results for input(s): HGBA1C in the last 72 hours. CBG: No results for input(s): GLUCAP in the last 168 hours. Lipid Profile: No results for input(s): CHOL, HDL, LDLCALC, TRIG, CHOLHDL, LDLDIRECT in the last 72 hours. Thyroid Function Tests:  Recent Labs  01/08/16 0135  TSH 6.421*  FREET4 0.92   Anemia Panel:  Recent Labs  01/08/16 0135  VITAMINB12 153*   Sepsis Labs: No results for input(s): PROCALCITON, LATICACIDVEN in the last 168 hours.  No results found for this or any previous visit (from the past 240 hour(s)).       Radiology Studies: Dg Chest 2 View  Result Date: 01/07/2016 CLINICAL DATA:  Headache dizziness dark spot left vision EXAM: CHEST  2 VIEW COMPARISON:  08/15/2014 FINDINGS: The heart size and mediastinal contours are within normal limits. Both lungs are clear. The visualized skeletal structures are unremarkable.  IMPRESSION: No active cardiopulmonary disease. Electronically Signed   By: Jasmine PangKim  Fujinaga M.D.   On: 01/07/2016 19:17   Mr Laqueta JeanBrain W And Wo Contrast  Result Date: 01/07/2016 CLINICAL DATA:  Initial evaluation for acute headache with dizziness. Dark spot and left vision. EXAM: MRI HEAD AND ORBITS WITHOUT AND WITH CONTRAST TECHNIQUE: Multiplanar, multiecho pulse sequences of the brain and surrounding structures were obtained without and with intravenous contrast. Multiplanar, multiecho pulse sequences of the orbits and surrounding structures were obtained including fat saturation techniques, before and after intravenous contrast administration. CONTRAST:  20 cc of MultiHance. COMPARISON:  None. FINDINGS: MRI HEAD FINDINGS Cerebral volume within normal limits for patient age. Few patchy T2/FLAIR hyperintense foci noted within the periventricular, deep, and subcortical white matter both cerebral hemispheres, nonspecific, but somewhat advanced for patient age. A single left periatrial lesion demonstrates hypo intense T1 signal intensity (series 9, image 28). No abnormal foci of restricted diffusion to suggest acute or subacute ischemia. Gray-white matter  differentiation maintained. Major intracranial vascular flow voids are well preserved. No acute or chronic intracranial hemorrhage. No areas chronic infarction. No mass lesion, midline shift, or mass effect. No hydrocephalus. No extra-axial fluid collection. Major dural sinuses are grossly patent. No abnormal enhancement. Craniocervical junction within normal limits. Pituitary gland normal. Paranasal sinuses are clear. No mastoid effusion. Inner ear structures are normal. Bone marrow signal intensity within normal limits. No scalp soft tissue abnormality. MRI ORBITS FINDINGS Dedicated views of the orbits were performed. Globes are not symmetric in size with normal appearance. Extra-ocular muscles are symmetric and normal in appearance. Lacrimal glands are normal. Optic  nerves are symmetric bilaterally without edema or abnormal enhancement. Optic chiasm normal. Superior ophthalmic vein symmetric and within normal limits bilaterally. IMPRESSION: 1. Mild patchy T2/FLAIR hyperintense foci involving the periventricular, deep, and subcortical white matter of both cerebral hemispheres. While these foci are nonspecific, these are felt to be relatively increased for patient age. Given the appearance and distribution of a few of these lesions, possible underlying demyelinating disease could be considered. No evidence for active demyelination on this exam. Alternative differential considerations include accelerated chronic small vessel ischemic disease, sequela of underlying migrainous disorder, or possibly vasculitis. 2. Otherwise normal brain MRI. 3. Normal MRI appearance of the orbits. Electronically Signed   By: Rise Mu M.D.   On: 01/07/2016 22:19   Mr Birdie Hopes Wo/w Cm  Result Date: 01/07/2016 CLINICAL DATA:  Initial evaluation for acute headache with dizziness. Dark spot and left vision. EXAM: MRI HEAD AND ORBITS WITHOUT AND WITH CONTRAST TECHNIQUE: Multiplanar, multiecho pulse sequences of the brain and surrounding structures were obtained without and with intravenous contrast. Multiplanar, multiecho pulse sequences of the orbits and surrounding structures were obtained including fat saturation techniques, before and after intravenous contrast administration. CONTRAST:  20 cc of MultiHance. COMPARISON:  None. FINDINGS: MRI HEAD FINDINGS Cerebral volume within normal limits for patient age. Few patchy T2/FLAIR hyperintense foci noted within the periventricular, deep, and subcortical white matter both cerebral hemispheres, nonspecific, but somewhat advanced for patient age. A single left periatrial lesion demonstrates hypo intense T1 signal intensity (series 9, image 28). No abnormal foci of restricted diffusion to suggest acute or subacute ischemia. Gray-white matter  differentiation maintained. Major intracranial vascular flow voids are well preserved. No acute or chronic intracranial hemorrhage. No areas chronic infarction. No mass lesion, midline shift, or mass effect. No hydrocephalus. No extra-axial fluid collection. Major dural sinuses are grossly patent. No abnormal enhancement. Craniocervical junction within normal limits. Pituitary gland normal. Paranasal sinuses are clear. No mastoid effusion. Inner ear structures are normal. Bone marrow signal intensity within normal limits. No scalp soft tissue abnormality. MRI ORBITS FINDINGS Dedicated views of the orbits were performed. Globes are not symmetric in size with normal appearance. Extra-ocular muscles are symmetric and normal in appearance. Lacrimal glands are normal. Optic nerves are symmetric bilaterally without edema or abnormal enhancement. Optic chiasm normal. Superior ophthalmic vein symmetric and within normal limits bilaterally. IMPRESSION: 1. Mild patchy T2/FLAIR hyperintense foci involving the periventricular, deep, and subcortical white matter of both cerebral hemispheres. While these foci are nonspecific, these are felt to be relatively increased for patient age. Given the appearance and distribution of a few of these lesions, possible underlying demyelinating disease could be considered. No evidence for active demyelination on this exam. Alternative differential considerations include accelerated chronic small vessel ischemic disease, sequela of underlying migrainous disorder, or possibly vasculitis. 2. Otherwise normal brain MRI. 3. Normal MRI appearance of the orbits.  Electronically Signed   By: Rise Mu M.D.   On: 01/07/2016 22:19        Scheduled Meds: . cyanocobalamin  1,000 mcg Intramuscular Once  . [START ON 01/09/2016] Influenza vac split quadrivalent PF  0.5 mL Intramuscular Tomorrow-1000  . methylPREDNISolone (SOLU-MEDROL) injection  1,000 mg Intravenous Q24H  . nicotine  14  mg Transdermal Daily  . [START ON 01/09/2016] pneumococcal 23 valent vaccine  0.5 mL Intramuscular Tomorrow-1000   Continuous Infusions:    LOS: 1 day    Time spent: 40 minutes    Bennett Scrape, MD [facility] Pager [phone]  If 7PM-7AM, please contact night-coverage [email or web address] Password [email or web address] 01/08/2016, 10:13 AM

## 2016-01-08 NOTE — ED Notes (Signed)
Pt alert and oriented x4. Pt ambulatory. Pt only complaint is not being able to see normal.

## 2016-01-08 NOTE — Consult Note (Signed)
Neurology Consult Note  Reason for Consultation: optic neuritis  Requesting provider: Theotis Burrow, MD  CC: Vision loss in the L eye  HPI: This is a 22 showed right-handed woman who was sent to the emergency department by her ophthalmologist for further evaluation of left optic neuritis. History is obtained directly from the patient who is an excellent historian.  The patient reports that 3 days ago, she noticed some discomfort in the left eye which she describes as an itchy sensation. She initially thought this was related to allergies and took over-the-counter allergy medications. However, the next morning, she noticed shortly after waking up that her vision was abnormal in the left eye. She first noticed that things seemed blurry and "bubbly," as if someone is blowing bubbles in front of her eyes and she was trying to look through them. Over the course of the day, she developed a dark spot in the central vision of her left eye that was surrounded by this bubbly vision. The dark spot gradually worsened. This has been associated with some discomfort with movement of the left eye. She denies any symptoms in the right eye. She has not had any other focal neurologic deficits. She denies any prior history of transient focal neurologic deficits or vision loss. She went to an ophthalmologist (Dr. Schuyler Amor) and states that she was told that she had optic neuritis and that this could potentially be a manifestation of multiple sclerosis. He referred her to the emergency department for MRI scan of the brain and orbit in these at been completed. Neurology consultation is now requested for further recommendation.  PMH:  Past Medical History:  Diagnosis Date  . Acid reflux   . Depression   . Hypothyroid   . Ovarian cyst   . PCOS (polycystic ovarian syndrome)   . Thyroid disease     PSH:  History reviewed. No pertinent surgical history.  Family history: She denies any family history of major medical  problems, including multiple sclerosis or other autoimmune disorders.  Social history:  Social History   Social History  . Marital status: Single    Spouse name: N/A  . Number of children: N/A  . Years of education: N/A   Occupational History  . Not on file.   Social History Main Topics  . Smoking status: Current Every Day Smoker    Packs/day: 0.50    Years: 8.00    Types: Cigarettes  . Smokeless tobacco: Never Used  . Alcohol use Yes     Comment: occasionally  . Drug use: No  . Sexual activity: Yes    Birth control/ protection: Pill   Other Topics Concern  . Not on file   Social History Narrative  . No narrative on file    Current outpatient meds: Current Meds  Medication Sig  . levonorgestrel-ethinyl estradiol (ORSYTHIA) 0.1-20 MG-MCG tablet Take 1 tablet by mouth daily.    Current inpatient meds:  Current Facility-Administered Medications  Medication Dose Route Frequency Provider Last Rate Last Dose  . methylPREDNISolone sodium succinate (SOLU-MEDROL) 1,000 mg in sodium chloride 0.9 % 50 mL IVPB  1,000 mg Intravenous Once Sharlett Iles, MD   1,000 mg at 01/07/16 2338   Current Outpatient Prescriptions  Medication Sig Dispense Refill  . levonorgestrel-ethinyl estradiol (ORSYTHIA) 0.1-20 MG-MCG tablet Take 1 tablet by mouth daily.    Marland Kitchen ibuprofen (ADVIL,MOTRIN) 600 MG tablet Take 1 tablet (600 mg total) by mouth every 6 (six) hours as needed. (Patient not taking: Reported on  01/08/2016) 30 tablet 0  . loperamide (IMODIUM) 2 MG capsule Take 1 capsule (2 mg total) by mouth 4 (four) times daily as needed for diarrhea or loose stools. (Patient not taking: Reported on 01/08/2016) 12 capsule 0  . methocarbamol (ROBAXIN) 500 MG tablet Take 1 tablet (500 mg total) by mouth 2 (two) times daily. (Patient not taking: Reported on 01/08/2016) 20 tablet 0  . ondansetron (ZOFRAN) 4 MG tablet Take 1 tablet (4 mg total) by mouth every 6 (six) hours. (Patient not taking:  Reported on 01/08/2016) 12 tablet 0    Allergies: No Known Allergies  ROS: As per HPI. A full 14-point review of systems was performed and is otherwise unremarkable.   PE:  BP 135/81 (BP Location: Right Arm)   Pulse 78   Temp 98.2 F (36.8 C) (Oral)   Resp 16   Ht '5\' 7"'$  (1.702 m)   Wt 97.5 kg (215 lb)   LMP 01/06/2016   SpO2 99%   BMI 33.67 kg/m   General: WDWN, no acute distress. AAO x4. Speech clear, no dysarthria. No aphasia. Follows commands briskly. Affect is bright with congruent mood. Comportment is normal.  HEENT: Normocephalic. Neck supple without LAD. MMM, OP clear. Dentition good. Sclerae anicteric. No conjunctival injection.  CV: Regular, no murmur. Carotid pulses full and symmetric, no bruits. Distal pulses 2+ and symmetric.  Lungs: CTAB.  Abdomen: Soft, non-distended, non-tender. Bowel sounds present x4.  Extremities: No C/C/E. Neuro:  CN: Pupils are equal and round. The left pupil is sluggishly reactive to light and sheThey are symmetrically reactive from 3-->2 mm. EOMI without nystagmus. No reported diplopia. Facial sensation is intact to light touch. Face is symmetric at rest with normal strength and mobility. Hearing is intact to conversational voice. Palate elevates symmetrically and uvula is midline. Voice is normal in tone, pitch and quality. Bilateral SCM and trapezii are 5/5. Tongue is midline with normal bulk and mobility.  Motor: Normal bulk, tone, and strength. No tremor or other abnormal movements. No drift.  Sensation: Intact to light touch, pinprick, and vibration.  DTRs: Brisk 2+, symmetric. Toes downgoing bilaterally.  Coordination: Finger-to-nose is without dysmetria. Finger taps are normal in amplitude and speed, no decrement.    Labs:  Lab Results  Component Value Date   WBC 7.9 01/07/2016   HGB 12.4 01/07/2016   HCT 37.3 01/07/2016   PLT 268 01/07/2016   GLUCOSE 98 01/07/2016   ALT 19 01/07/2016   AST 15 01/07/2016   NA 139 01/07/2016    K 3.8 01/07/2016   CL 106 01/07/2016   CREATININE 0.75 01/07/2016   BUN 7 01/07/2016   CO2 26 01/07/2016   INR 0.9 03/30/2007   CRP less than 0.5 ESR 10 Serum beta hCG less than 5.0 RPR pending FTA pending Lyme disease DNA pending  Imaging:  I have personally and independently reviewed the MRI scan of the brain with and without contrast from today. This shows several scattered areas of T2/flair hyperintensity in the bihemispheric white matter. These are both subcortical and periventricular in distribution. None of these lesions shows enhancement after the administration of contrast. One lesion located in the left periatrial region shows associated hypointensity on T1 sequences. There is no restricted diffusion. The corpus callosum appears normal. Cerebral volumes appear normal. Orbits and optic nerves appear unremarkable.  Assessment and Plan:  1. Left optic neuritis: Presentation is consistent with optic neuritis. The most likely etiology in a 25 year old woman is multiple sclerosis. Other differential considerations  would include neuromyelitis optica, Sjogren's syndrome, SLE, sarcoidosis, infection (Lyme, zoster, syphilis among others), optic neuropathy due to B12 deficiency (particularly when compounded by tobacco use). She has not had any clear exposures and is not taking any medications that are associated with optic neuritis/optic neuropathy. I will check ANA, ANCA, SS-A/SS-B antibodies, NMO antibodies, vitamin B12, Lyme titers, and serum ACE. RPR is pending. Initiate high-dose steroids with IV Solu-Medrol 1 g daily for 3 days. This can be discontinued after the third dose, no role for prednisone taper in this setting. The patient was advised that the role for steroids is to shorten the recovery period but that they are not expected to affect her ultimate functional recovery in any significant way.  2. Probable multiple sclerosis: MRI scan of the brain shows numerous white matter lesions  that are suggestive of MS, none of which enhance with contrast to indicate active demyelination. Her optic neuritis is likely her clinically isolated syndrome given that she has not had any history of transient neurologic deficits before now. Differential considerations are as noted above and I will send the labs listed for further evaluation. I shared with the patient that there is a high likelihood that she has MS but we need to exclude potential mimics first. She will need to follow up with an outpatient neurologist for further discussions regarding possible disease modifying therapy depending upon results of current workup.  This was discussed with the patient and her significant other at the bedside. They are in agreement with the plan as noted. There were given opportunity to ask any questions and these were addressed to their satisfaction.  I also discussed this case with Dr. Rex Kras at the time of my consultation.

## 2016-01-08 NOTE — H&P (Signed)
History and Physical    Taylor Berendsen LPN:[id number] DOB: 02-21-1991 DOA: 01/07/2016  PCP: Elizabeth Palau, FNP   Patient coming from: Ophthamology office  Chief Complaint: Vision loss in the left eye  HPI: Taylor Donovan is a 25 y.o. woman with a history of PCOS and Hashimoto's thyroiditis S/P radioactive ablation (she is supposed to be on levothyroxine but says that she could not afford it) who presents to the ED after being referred by Dr. Lavona Mound of ophthamology.  The patient developed acute vision changes over the weekend, involving the left eye.  She continued to work through the weekend (she is employed at [employer], [place]) and presented for outpatient evaluation today.  She has had associated headaches and dizziness.  Dr. Lavona Mound was concerned that the patient's exam was consistent with optic neuritis, and she was referred to the ED for further evaluation, particularly to rule out MS.  ED Course: Unfortunately, the patient has an abnormal MRI of the brain, with white matter changes in bilateral cerebral hemispheres.  Neurology has seen the patient in the ED.  There is concern that her presentation is consistent with MS.  She has received IV solumedrol 1 gram in the ED.  Hospitalist asked to admit.  Review of Systems: As per HPI otherwise 10 point review of systems negative.    Past Medical History:  Diagnosis Date  . Acid reflux   . Depression   . Hypothyroid   . Ovarian cyst   . PCOS (polycystic ovarian syndrome)   . Thyroid disease     History reviewed. No pertinent surgical history.   reports that she has been smoking Cigarettes.  She has a 4.00 pack-year smoking history. She has never used smokeless tobacco. She reports that she drinks alcohol. She reports that she does not use drugs.  Social EtOH use.  No illicit drug use.  She is not married.  She has one daughter.  No Known Allergies  FAMILY HISTORY: Multiple family members affected with muscular dystrophy on  her dad's side.  No history of autoimmune disorders.  Prior to Admission medications   Medication Sig Start Date End Date Taking? Authorizing Provider  levonorgestrel-ethinyl estradiol (ORSYTHIA) 0.1-20 MG-MCG tablet Take 1 tablet by mouth daily.   Yes Historical Provider, MD    Physical Exam: Vitals:   01/07/16 1741 01/07/16 2047 01/08/16 0125  BP: (!) 145/103 135/81 140/77  Pulse: 86 78 72  Resp: 16 16 16   Temp: 98.4 F (36.9 C) 98.2 F (36.8 C) 98.3 F (36.8 C)  TempSrc: Oral Oral Oral  SpO2: 98% 99% 97%  Weight: 97.5 kg (215 lb)  104 kg (229 lb 3.2 oz)  Height: 5\' 7"  (1.702 m)  5\' 7"  (1.702 m)      Constitutional: NAD, calm, comfortable, nontoxic appearing.  She still cannot see out of her left eye if her right eye is closed. Vitals:   01/07/16 1741 01/07/16 2047 01/08/16 0125  BP: (!) 145/103 135/81 140/77  Pulse: 86 78 72  Resp: 16 16 16   Temp: 98.4 F (36.9 C) 98.2 F (36.8 C) 98.3 F (36.8 C)  TempSrc: Oral Oral Oral  SpO2: 98% 99% 97%  Weight: 97.5 kg (215 lb)  104 kg (229 lb 3.2 oz)  Height: 5\' 7"  (1.702 m)  5\' 7"  (1.702 m)   Eyes: PERRL, lids and conjunctivae normal ENMT: Mucous membranes are moist. Posterior pharynx clear of any exudate or lesions. Normal dentition.  Neck: normal appearance, supple Respiratory: clear to auscultation bilaterally, no  wheezing, no crackles. Normal respiratory effort. No accessory muscle use.  Cardiovascular: Normal rate, regular rhythm, no murmurs / rubs / gallops. No extremity edema. 2+ pedal pulses. No carotid bruits.  GI: abdomen is soft and compressible.  No distention.  No tenderness.  No masses palpated.  Bowel sounds are present. Musculoskeletal:  No joint deformity in upper and lower extremities. Good ROM, no contractures. Normal muscle tone.  Skin: no rashes, warm and dry Neurologic: CN grossly intact. Sensation intact, Strength symmetric bilaterally, 5/5  Psychiatric: Normal judgment and insight. Alert and oriented  x 3. Normal mood.     Labs on Admission: I have personally reviewed following labs and imaging studies  CBC:  Recent Labs Lab 01/07/16 1832  WBC 7.9  NEUTROABS 4.4  HGB 12.4  HCT 37.3  MCV 88.0  PLT 268   Basic Metabolic Panel:  Recent Labs Lab 01/07/16 1832  NA 139  K 3.8  CL 106  CO2 26  GLUCOSE 98  BUN 7  CREATININE 0.75  CALCIUM 9.1   GFR: Estimated Creatinine Clearance: 132.2 mL/min (by C-G formula based on SCr of 0.75 mg/dL). Liver Function Tests:  Recent Labs Lab 01/07/16 1832  AST 15  ALT 19  ALKPHOS 64  BILITOT 0.2*  PROT 6.7  ALBUMIN 3.9   Urine analysis:    Component Value Date/Time   COLORURINE YELLOW 11/28/2015 1710   APPEARANCEUR CLEAR 11/28/2015 1710   LABSPEC 1.032 (H) 11/28/2015 1710   PHURINE 6.0 11/28/2015 1710   GLUCOSEU NEGATIVE 11/28/2015 1710   HGBUR NEGATIVE 11/28/2015 1710   BILIRUBINUR NEGATIVE 11/28/2015 1710   KETONESUR NEGATIVE 11/28/2015 1710   PROTEINUR NEGATIVE 11/28/2015 1710   UROBILINOGEN 0.2 04/03/2014 1922   NITRITE NEGATIVE 11/28/2015 1710   LEUKOCYTESUR NEGATIVE 11/28/2015 1710    Radiological Exams on Admission: Dg Chest 2 View  Result Date: 01/07/2016 CLINICAL DATA:  Headache dizziness dark spot left vision EXAM: CHEST  2 VIEW COMPARISON:  08/15/2014 FINDINGS: The heart size and mediastinal contours are within normal limits. Both lungs are clear. The visualized skeletal structures are unremarkable. IMPRESSION: No active cardiopulmonary disease. Electronically Signed   By: Jasmine Pang M.D.   On: 01/07/2016 19:17   Mr Laqueta Jean And Wo Contrast  Result Date: 01/07/2016 CLINICAL DATA:  Initial evaluation for acute headache with dizziness. Dark spot and left vision. EXAM: MRI HEAD AND ORBITS WITHOUT AND WITH CONTRAST TECHNIQUE: Multiplanar, multiecho pulse sequences of the brain and surrounding structures were obtained without and with intravenous contrast. Multiplanar, multiecho pulse sequences of the orbits  and surrounding structures were obtained including fat saturation techniques, before and after intravenous contrast administration. CONTRAST:  20 cc of MultiHance. COMPARISON:  None. FINDINGS: MRI HEAD FINDINGS Cerebral volume within normal limits for patient age. Few patchy T2/FLAIR hyperintense foci noted within the periventricular, deep, and subcortical white matter both cerebral hemispheres, nonspecific, but somewhat advanced for patient age. A single left periatrial lesion demonstrates hypo intense T1 signal intensity (series 9, image 28). No abnormal foci of restricted diffusion to suggest acute or subacute ischemia. Gray-white matter differentiation maintained. Major intracranial vascular flow voids are well preserved. No acute or chronic intracranial hemorrhage. No areas chronic infarction. No mass lesion, midline shift, or mass effect. No hydrocephalus. No extra-axial fluid collection. Major dural sinuses are grossly patent. No abnormal enhancement. Craniocervical junction within normal limits. Pituitary gland normal. Paranasal sinuses are clear. No mastoid effusion. Inner ear structures are normal. Bone marrow signal intensity within normal limits. No scalp  soft tissue abnormality. MRI ORBITS FINDINGS Dedicated views of the orbits were performed. Globes are not symmetric in size with normal appearance. Extra-ocular muscles are symmetric and normal in appearance. Lacrimal glands are normal. Optic nerves are symmetric bilaterally without edema or abnormal enhancement. Optic chiasm normal. Superior ophthalmic vein symmetric and within normal limits bilaterally. IMPRESSION: 1. Mild patchy T2/FLAIR hyperintense foci involving the periventricular, deep, and subcortical white matter of both cerebral hemispheres. While these foci are nonspecific, these are felt to be relatively increased for patient age. Given the appearance and distribution of a few of these lesions, possible underlying demyelinating disease  could be considered. No evidence for active demyelination on this exam. Alternative differential considerations include accelerated chronic small vessel ischemic disease, sequela of underlying migrainous disorder, or possibly vasculitis. 2. Otherwise normal brain MRI. 3. Normal MRI appearance of the orbits. Electronically Signed   By: Rise MuBenjamin  McClintock M.D.   On: 01/07/2016 22:19   Mr Birdie HopesOrbits Wo/w Cm  Result Date: 01/07/2016 CLINICAL DATA:  Initial evaluation for acute headache with dizziness. Dark spot and left vision. EXAM: MRI HEAD AND ORBITS WITHOUT AND WITH CONTRAST TECHNIQUE: Multiplanar, multiecho pulse sequences of the brain and surrounding structures were obtained without and with intravenous contrast. Multiplanar, multiecho pulse sequences of the orbits and surrounding structures were obtained including fat saturation techniques, before and after intravenous contrast administration. CONTRAST:  20 cc of MultiHance. COMPARISON:  None. FINDINGS: MRI HEAD FINDINGS Cerebral volume within normal limits for patient age. Few patchy T2/FLAIR hyperintense foci noted within the periventricular, deep, and subcortical white matter both cerebral hemispheres, nonspecific, but somewhat advanced for patient age. A single left periatrial lesion demonstrates hypo intense T1 signal intensity (series 9, image 28). No abnormal foci of restricted diffusion to suggest acute or subacute ischemia. Gray-white matter differentiation maintained. Major intracranial vascular flow voids are well preserved. No acute or chronic intracranial hemorrhage. No areas chronic infarction. No mass lesion, midline shift, or mass effect. No hydrocephalus. No extra-axial fluid collection. Major dural sinuses are grossly patent. No abnormal enhancement. Craniocervical junction within normal limits. Pituitary gland normal. Paranasal sinuses are clear. No mastoid effusion. Inner ear structures are normal. Bone marrow signal intensity within normal  limits. No scalp soft tissue abnormality. MRI ORBITS FINDINGS Dedicated views of the orbits were performed. Globes are not symmetric in size with normal appearance. Extra-ocular muscles are symmetric and normal in appearance. Lacrimal glands are normal. Optic nerves are symmetric bilaterally without edema or abnormal enhancement. Optic chiasm normal. Superior ophthalmic vein symmetric and within normal limits bilaterally. IMPRESSION: 1. Mild patchy T2/FLAIR hyperintense foci involving the periventricular, deep, and subcortical white matter of both cerebral hemispheres. While these foci are nonspecific, these are felt to be relatively increased for patient age. Given the appearance and distribution of a few of these lesions, possible underlying demyelinating disease could be considered. No evidence for active demyelination on this exam. Alternative differential considerations include accelerated chronic small vessel ischemic disease, sequela of underlying migrainous disorder, or possibly vasculitis. 2. Otherwise normal brain MRI. 3. Normal MRI appearance of the orbits. Electronically Signed   By: Rise MuBenjamin  McClintock M.D.   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 --IV solumedrol  1 gram x three days, first given in the ED --Follow up further neurology recommendations; she will need close outpatient follow up  History of untreated hypothyroidism --Will check TSH and free T4  Active tobacco use --Will order nicotine patch   DVT prophylaxis: Early Ambulation Code Status: FULL Family Communication: Patient alone in the ED at time of admission Disposition Plan: To be determined Consults called:  Neurology Admission status: Inpatient, med surg   TIME SPENT: 60 minutes   Jerene Bears MD [facility] Pager [phone]  If 7PM-7AM, please contact night-coverage [email or web address] Password [email or web address]  01/08/2016, 1:31 AM

## 2016-01-08 NOTE — Care Management Note (Signed)
Case Management Note  Patient Details  Name: Taylor Donovan MRN: [id number] Date of Birth: Mar 25, 1991  Subjective/Objective:     Pt admitted with vision loss on lt eye. She is from home with spouse.                Action/Plan: Plan is for IV steroids. CM following for d/c needs.   Expected Discharge Date:                  Expected Discharge Plan:  Home/Self Care  In-House Referral:     Discharge planning Services     Post Acute Care Choice:    Choice offered to:     DME Arranged:    DME Agency:     HH Arranged:    HH Agency:     Status of Service:  In process, will continue to follow  If discussed at Long Length of Stay Meetings, dates discussed:    Additional Comments:  Kermit Balo, RN 01/08/2016, 10:39 AM

## 2016-01-08 NOTE — Progress Notes (Signed)
Please see consult by Dr. Roxy Mannsster. Likely new diagnosis of multiple sclerosis, also with B12 deficiency. B12 deficiency likely would cause bilateral painless optic neuropathy rather than acute optic neuritis and therefore I think that it is unlikely the ideology, but still needs to be repleted.  She will need to complete 3 days of IV steroids.  Taylor SlotMcNeill Kaydon Husby, MD [facility] [phone]  If 7pm- 7am, please page neurology on call as listed in AMION.

## 2016-01-09 DIAGNOSIS — E538 Deficiency of other specified B group vitamins: Secondary | ICD-10-CM

## 2016-01-09 LAB — SJOGRENS SYNDROME-B EXTRACTABLE NUCLEAR ANTIBODY: SSB (La) (ENA) Antibody, IgG: <0.2 AI (ref 0.0–0.9)

## 2016-01-09 LAB — MPO/PR-3 (ANCA) ANTIBODIES: ANCA Proteinase 3: <3.5 U/mL (ref 0.0–3.5)

## 2016-01-09 LAB — ANCA TITERS
Atypical P-ANCA titer: <1:20 {titer}
P-ANCA: <1:20 {titer}

## 2016-01-09 LAB — SJOGRENS SYNDROME-A EXTRACTABLE NUCLEAR ANTIBODY

## 2016-01-09 LAB — FLUORESCENT TREPONEMAL AB(FTA)-IGG-BLD: Fluorescent Treponemal Ab, IgG: NONREACTIVE

## 2016-01-09 LAB — B. BURGDORFI ANTIBODIES: B burgdorferi Ab IgG+IgM: <0.91 {ISR} (ref 0.00–0.90)

## 2016-01-09 LAB — ANGIOTENSIN CONVERTING ENZYME: ANGIOTENSIN-CONVERTING ENZYME: 34 U/L (ref 14–82)

## 2016-01-09 LAB — NEUROMYELITIS OPTICA AUTOAB, IGG: NMO-IgG: <1.5 U/mL (ref 0.0–3.0)

## 2016-01-09 LAB — LYME DISEASE DNA BY PCR(BORRELIA BURG): Lyme Disease(B.burgdorferi)PCR: NEGATIVE

## 2016-01-09 LAB — ANTINUCLEAR ANTIBODIES, IFA: ANA Ab, IFA: NEGATIVE

## 2016-01-09 MED ORDER — TRAMADOL HCL 50 MG PO TABS
100.0000 mg | ORAL_TABLET | ORAL | Status: DC | PRN
  Administered 2016-01-09: 100 mg via ORAL
  Filled 2016-01-09: qty 2

## 2016-01-09 MED ORDER — PREDNISONE 10 MG PO TABS: ORAL_TABLET | ORAL | 0 refills | Status: DC

## 2016-01-09 MED ORDER — CYANOCOBALAMIN 1000 MCG/ML IJ KIT: 1000.0000 ug | PACK | INTRAMUSCULAR | 0 refills | Status: AC

## 2016-01-09 MED ORDER — TRAMADOL HCL 50 MG PO TABS: 100.0000 mg | ORAL_TABLET | ORAL | 0 refills | Status: DC | PRN

## 2016-01-09 MED ORDER — ACETAMINOPHEN 325 MG PO TABS: 650.0000 mg | ORAL_TABLET | Freq: Four times a day (QID) | ORAL | 0 refills | Status: DC | PRN

## 2016-01-09 NOTE — Care Management Note (Signed)
Case Management Note  Patient Details  Name: Taylor Donovan MRN: [id number] Date of Birth: [birth year]  Subjective/Objective:                    Action/Plan: Pt discharging home with self care. No further needs per CM.   Expected Discharge Date:                  Expected Discharge Plan:  Home/Self Care  In-House Referral:     Discharge planning Services     Post Acute Care Choice:    Choice offered to:     DME Arranged:    DME Agency:     HH Arranged:    HH Agency:     Status of Service:  In process, will continue to follow  If discussed at Long Length of Stay Meetings, dates discussed:    Additional Comments:  Kermit Balo, RN 01/09/2016, 12:05 PM

## 2016-01-09 NOTE — Discharge Summary (Addendum)
Physician Discharge Summary  Taylor Donovan MEQ:[id number] DOB: [birth year] DOA: 01/07/2016  PCP: Taylor Aly, FNP  Admit date: 01/07/2016 Discharge date: 01/09/2016  Recommendations for Outpatient Follow-up:  Taper prednisone such that you will take prednisone 50 mg 01/09/2016 then 40 mg 01/10/2016 and so on until you complete to 0 mg, then stop  Continue weekly B 12 injections for total of 1 month (so 4 doses)  Follow-up with neurology as an outpatient basis  Follow-up autoimmune workup started on this admission  Discharge Diagnoses:  Principal Problem:   Vision loss of left eye Active Problems:   Abnormal brain MRI   Hypothyroidism   Vision loss, left eye    Discharge Condition: stable   Diet recommendation: as tolerated   History of present illness:   Per brief narrative 01/08/2016 "25 y.o.woman with a history of PCOS and Hashimoto's thyroiditis S/P radioactive ablation (she is supposed to be on levothyroxine but says that she could not afford it) who presents to the ED after being referred by Dr. Schuyler Amor of ophthamology. The patient developed acute vision changes over the weekend, involving the left eye. She continued to work through the weekend (she is employed at [employer], [place]) and presented for outpatient evaluation today. She has had associated headaches and dizziness. Dr. Schuyler Amor was concerned that the patient's exam was consistent with optic neuritis, and she was referred to the ED for further evaluation, particularly to rule out MS. Unfortunately, the patient has an abnormal MRI of the brain, with white matter changes in bilateral cerebral hemispheres. Neurology has seen the patient in the ED. There is concern that her presentation is consistent with MS. She has received IV solumedrol 1 gram in the ED. Hospitalist asked to admit."  Hospital Course:   Principal Problem: Vision loss of left eye / Optic neuritis - Per neurology, has received total of 3  days of IV Solu-Medrol - Cannot say with certainty that this is MS - Vision improving - Patient will continue prednisone taper as noted above and will follow up wigy as an outpatient basis  Active Problems: B-12 deficiency  - Patient has received B-12 in hospital and she will continue B-12 injections weekly for one month and then follow-up with neurology. She also has an autoimmune workup started on this admission which needs to be followed up on outpatient basis      Signed:  Leisa Lenz, MD  [facility] 01/09/2016, 12:02 PM  Pager #: [phone]  Time spent in minutes: less than 30 minutes  Discharge Exam: Vitals:   01/09/16 0547 01/09/16 0958  BP: (!) 148/82 138/60  Pulse: 86 100  Resp: 18 18  Temp: 98.6 F (37 C) 98 F (36.7 C)   Vitals:   01/08/16 2130 01/09/16 0110 01/09/16 0547 01/09/16 0958  BP: 138/81 139/84 (!) 148/82 138/60  Pulse: 87 97 86 100  Resp: _0 Temp: 98.6 F (37 C) 98 F (36.7 C) 98.6 F (37 C) 98 F (36.7 C)  TempSrc: Oral Oral Oral Oral  SpO2: 97% 98% 97% 96%  Weight:      Height:        General: Pt is alert, follows commands appropriately, not in acute distress Cardiovascular: Regular rate and rhythm, S1/S2 + Respiratory: Clear to auscultation bilaterally, no wheezing, no crackles, no rhonchi Abdominal: Soft, non tender, non distended, bowel sounds +, no guarding Extremities: no edema, no cyanosis, pulses palpable bilaterally DP and PT Neuro: Grossly nonfocal  Discharge Instructions  Discharge Instructions  Ambulatory referral to Neurology    Complete by:  As directed    An appointment is requested in approximately: 2 weeks   Call MD for:  persistant nausea and vomiting    Complete by:  As directed    Call MD for:  redness, tenderness, or signs of infection (pain, swelling, redness, odor or green/yellow discharge around incision site)    Complete by:  As directed    Call MD for:  severe uncontrolled pain     Complete by:  As directed    Diet - low sodium heart healthy    Complete by:  As directed    Discharge instructions    Complete by:  As directed    Taper prednisone such that you will take prednisone 50 mg 01/09/2016 then 40 mg 01/10/2016 and so on until you complete to 0 mg, then stop  Continue weekly B 12 injections for total of 1 month (so 4 doses)  Follow-up with neurology as an outpatient basis   Increase activity slowly    Complete by:  As directed        Medication List    TAKE these medications   acetaminophen 325 MG tablet Commonly known as:  TYLENOL Take 2 tablets (650 mg total) by mouth every 6 (six) hours as needed for mild pain (or Fever >/= 101).   Cyanocobalamin 1000 MCG/ML Kit Commonly known as:  B-12 COMPLIANCE INJECTION Inject 1,000 mcg as directed once a week.   ORSYTHIA 0.1-20 MG-MCG tablet Generic drug:  levonorgestrel-ethinyl estradiol Take 1 tablet by mouth daily.   predniSONE 10 MG tablet Commonly known as:  DELTASONE Taper prednisone such that you will take prednisone 50 mg 01/09/2016 then 40 mg 01/10/2016, then 30 mg on 01/11/2016 and so on until you complete to 0 mg, then stop   traMADol 50 MG tablet Commonly known as:  ULTRAM Take 2 tablets (100 mg total) by mouth every 4 (four) hours as needed for moderate pain (or Headache unrelieved by tylenol).      Follow-up Information    ANDERSON,TERESA, FNP. Schedule an appointment as soon as possible for a visit in 1 week(s).   Specialty:  Nurse Practitioner Contact information: [address] [place] [address] [phone]            The results of significant diagnostics from this hospitalization (including imaging, microbiology, ancillary and laboratory) are listed below for reference.    Significant Diagnostic Studies: Dg Chest 2 View  Result Date: 01/07/2016 CLINICAL DATA:  Headache dizziness dark spot left vision EXAM: CHEST  2 VIEW COMPARISON:  08/15/2014  FINDINGS: The heart size and mediastinal contours are within normal limits. Both lungs are clear. The visualized skeletal structures are unremarkable. IMPRESSION: No active cardiopulmonary disease. Electronically Signed   By: Donavan Foil M.D.   On: 01/07/2016 19:17   Mr Jeri Cos And Wo Contrast  Result Date: 01/07/2016 CLINICAL DATA:  Initial evaluation for acute headache with dizziness. Dark spot and left vision. EXAM: MRI HEAD AND ORBITS WITHOUT AND WITH CONTRAST TECHNIQUE: Multiplanar, multiecho pulse sequences of the brain and surrounding structures were obtained without and with intravenous contrast. Multiplanar, multiecho pulse sequences of the orbits and surrounding structures were obtained including fat saturation techniques, before and after intravenous contrast administration. CONTRAST:  20 cc of MultiHance. COMPARISON:  None. FINDINGS: MRI HEAD FINDINGS Cerebral volume within normal limits for patient age. Few patchy T2/FLAIR hyperintense foci noted within the periventricular, deep, and subcortical white  matter both cerebral hemispheres, nonspecific, but somewhat advanced for patient age. A single left periatrial lesion demonstrates hypo intense T1 signal intensity (series 9, image 28). No abnormal foci of restricted diffusion to suggest acute or subacute ischemia. Gray-white matter differentiation maintained. Major intracranial vascular flow voids are well preserved. No acute or chronic intracranial hemorrhage. No areas chronic infarction. No mass lesion, midline shift, or mass effect. No hydrocephalus. No extra-axial fluid collection. Major dural sinuses are grossly patent. No abnormal enhancement. Craniocervical junction within normal limits. Pituitary gland normal. Paranasal sinuses are clear. No mastoid effusion. Inner ear structures are normal. Bone marrow signal intensity within normal limits. No scalp soft tissue abnormality. MRI ORBITS FINDINGS Dedicated views of the orbits were performed.  Globes are not symmetric in size with normal appearance. Extra-ocular muscles are symmetric and normal in appearance. Lacrimal glands are normal. Optic nerves are symmetric bilaterally without edema or abnormal enhancement. Optic chiasm normal. Superior ophthalmic vein symmetric and within normal limits bilaterally. IMPRESSION: 1. Mild patchy T2/FLAIR hyperintense foci involving the periventricular, deep, and subcortical white matter of both cerebral hemispheres. While these foci are nonspecific, these are felt to be relatively increased for patient age. Given the appearance and distribution of a few of these lesions, possible underlying demyelinating disease could be considered. No evidence for active demyelination on this exam. Alternative differential considerations include accelerated chronic small vessel ischemic disease, sequela of underlying migrainous disorder, or possibly vasculitis. 2. Otherwise normal brain MRI. 3. Normal MRI appearance of the orbits. Electronically Signed   By: Jeannine Boga M.D.   On: 01/07/2016 22:19   Mr Darnelle Catalan Wo/w Cm  Result Date: 01/07/2016 CLINICAL DATA:  Initial evaluation for acute headache with dizziness. Dark spot and left vision. EXAM: MRI HEAD AND ORBITS WITHOUT AND WITH CONTRAST TECHNIQUE: Multiplanar, multiecho pulse sequences of the brain and surrounding structures were obtained without and with intravenous contrast. Multiplanar, multiecho pulse sequences of the orbits and surrounding structures were obtained including fat saturation techniques, before and after intravenous contrast administration. CONTRAST:  20 cc of MultiHance. COMPARISON:  None. FINDINGS: MRI HEAD FINDINGS Cerebral volume within normal limits for patient age. Few patchy T2/FLAIR hyperintense foci noted within the periventricular, deep, and subcortical white matter both cerebral hemispheres, nonspecific, but somewhat advanced for patient age. A single left periatrial lesion demonstrates hypo  intense T1 signal intensity (series 9, image 28). No abnormal foci of restricted diffusion to suggest acute or subacute ischemia. Gray-white matter differentiation maintained. Major intracranial vascular flow voids are well preserved. No acute or chronic intracranial hemorrhage. No areas chronic infarction. No mass lesion, midline shift, or mass effect. No hydrocephalus. No extra-axial fluid collection. Major dural sinuses are grossly patent. No abnormal enhancement. Craniocervical junction within normal limits. Pituitary gland normal. Paranasal sinuses are clear. No mastoid effusion. Inner ear structures are normal. Bone marrow signal intensity within normal limits. No scalp soft tissue abnormality. MRI ORBITS FINDINGS Dedicated views of the orbits were performed. Globes are not symmetric in size with normal appearance. Extra-ocular muscles are symmetric and normal in appearance. Lacrimal glands are normal. Optic nerves are symmetric bilaterally without edema or abnormal enhancement. Optic chiasm normal. Superior ophthalmic vein symmetric and within normal limits bilaterally. IMPRESSION: 1. Mild patchy T2/FLAIR hyperintense foci involving the periventricular, deep, and subcortical white matter of both cerebral hemispheres. While these foci are nonspecific, these are felt to be relatively increased for patient age. Given the appearance and distribution of a few of these lesions, possible underlying demyelinating disease could  be considered. No evidence for active demyelination on this exam. Alternative differential considerations include accelerated chronic small vessel ischemic disease, sequela of underlying migrainous disorder, or possibly vasculitis. 2. Otherwise normal brain MRI. 3. Normal MRI appearance of the orbits. Electronically Signed   By: Jeannine Boga M.D.   On: 01/07/2016 22:19    Microbiology: No results found for this or any previous visit (from the past 240 hour(s)).   Labs: Basic  Metabolic Panel:  Recent Labs Lab 01/07/16 1832 01/08/16 1055  NA 139  --   K 3.8  --   CL 106  --   CO2 26  --   GLUCOSE 98  --   BUN 7  --   CREATININE 0.75 0.85  CALCIUM 9.1  --    Liver Function Tests:  Recent Labs Lab 01/07/16 1832  AST 15  ALT 19  ALKPHOS 64  BILITOT 0.2*  PROT 6.7  ALBUMIN 3.9   No results for input(s): LIPASE, AMYLASE in the last 168 hours. No results for input(s): AMMONIA in the last 168 hours. CBC:  Recent Labs Lab 01/07/16 1832 01/08/16 1055  WBC 7.9 8.2  NEUTROABS 4.4  --   HGB 12.4 13.6  HCT 37.3 40.4  MCV 88.0 86.3  PLT 268 309   Cardiac Enzymes: No results for input(s): CKTOTAL, CKMB, CKMBINDEX, TROPONINI in the last 168 hours. BNP: BNP (last 3 results) No results for input(s): BNP in the last 8760 hours.  ProBNP (last 3 results) No results for input(s): PROBNP in the last 8760 hours.  CBG: No results for input(s): GLUCAP in the last 168 hours.

## 2016-01-09 NOTE — Discharge Instructions (Signed)
Visual Disturbances °You have had a disturbance in your vision. This may be caused by various conditions, such as: °· Migraines. Migraine headaches are often preceded by a disturbance in vision. Blind spots or light flashes are followed by a headache. This type of visual disturbance is temporary. It does not damage the eye. °· Glaucoma. This is caused by increased pressure in the eye. Symptoms include haziness, blurred vision, or seeing rainbow colored circles when looking at bright lights. Partial or complete visual loss can occur. You may or may not experience eye pain. Visual loss may be gradual or sudden and is irreversible. Glaucoma is the leading cause of blindness. °· Retina problems. Vision will be reduced if the retina becomes detached or if there is a circulation problem as with diabetes, high blood pressure, or a mini-stroke. Symptoms include seeing "floaters," flashes of light, or shadows, as if a curtain has fallen over your eye. °· Optic nerve problems. The main nerve in your eye can be damaged by redness, soreness, and swelling (inflammation), poor circulation, drugs, and toxins. °It is very important to have a complete exam done by a specialist to determine the exact cause of your eye problem. The specialist may recommend medicines or surgery, depending on the cause of the problem. This can help prevent further loss of vision or reduce the risk of having a stroke. Contact the caregiver to whom you have been referred and arrange for follow-up care right away. °SEEK IMMEDIATE MEDICAL CARE IF:  °· Your vision gets worse. °· You develop severe headaches. °· You have any weakness or numbness in the face, arms, or legs. °· You have any trouble speaking or walking. °  °This information is not intended to replace advice given to you by your health care provider. Make sure you discuss any questions you have with your health care provider. °  °Document Released: 04/24/2004 Document Revised: 06/09/2011 Document  Reviewed: 08/24/2013 °Elsevier Interactive Patient Education ©2016 [employer] ° °

## 2016-01-09 NOTE — Progress Notes (Signed)
Subjective: She feels that her vision is improving  Exam: Vitals:   01/09/16 0547 01/09/16 0958  BP: (!) 148/82 138/60  Pulse: 86 100  Resp: 18 18  Temp: 98.6 F (37 C) 98 F (36.7 C)   Gen: In bed, NAD Resp: non-labored breathing, no acute distress Abd: soft, nt  Neuro: MS: Awake, alert, interactive and appropriate CN: Pupils equal round and reactive to light, left APD Motor: MAEW Sensory: Intact light touch  Impression: 25 year old female with a history of Graves' disease who presents with optic neuritis and MRI concerning for multiple sclerosis. She also has low B12, but I do not think that this is likely the cause of her optic nerve changes as this typically causes  bilateral optic neuropathy as opposed to painful optic neuritis. I do wonder about pernicious anemia given her history of another autoimmune disease.  Workup for mimics is pending, but I don't think she needs to stay in the hospital while these are being awaited.  Recommendations: 1) pernicious anemia antibodies, B12 replacement 2) follow-up, I have requested follow-[facility] Epimenio [facility] 3) would do steroid taper, 60 mg 1 day, 50 mg 1 day, 30 g 1 day, 20 mg 1 day, 10 mg 1 day then stop 4) no further recommendations, neurology will sign off.  Ritta SlotMcNeill Mouhamed Glassco, MD [facility] [phone]  If 7pm- 7am, please page neurology on call as listed in AMION.

## 2016-01-09 NOTE — Progress Notes (Signed)
Patient is discharged from room 5C02 at this time. Alert and in stable condition. IV site d/c'd. Instructions given to patient and fiance with understanding verbalized. Left unit via wheelchair with all belongings at side.

## 2016-01-10 LAB — INTRINSIC FACTOR ANTIBODIES: INTRINSIC FACTOR: 1 [AU]/ml (ref 0.0–1.1)

## 2016-01-16 DIAGNOSIS — G35 Multiple sclerosis: Secondary | ICD-10-CM | POA: Insufficient documentation

## 2016-01-16 DIAGNOSIS — E538 Deficiency of other specified B group vitamins: Secondary | ICD-10-CM | POA: Insufficient documentation

## 2016-01-29 ENCOUNTER — Ambulatory Visit (INDEPENDENT_AMBULATORY_CARE_PROVIDER_SITE_OTHER): Payer: Self-pay | Admitting: Neurology

## 2016-01-29 ENCOUNTER — Encounter: Payer: Self-pay | Admitting: Neurology

## 2016-01-29 VITALS — BP 138/84 | HR 92 | Resp 16 | Ht 67.0 in | Wt 235.0 lb

## 2016-01-29 DIAGNOSIS — H5462 Unqualified visual loss, left eye, normal vision right eye: Secondary | ICD-10-CM

## 2016-01-29 DIAGNOSIS — E063 Autoimmune thyroiditis: Secondary | ICD-10-CM

## 2016-01-29 DIAGNOSIS — H469 Unspecified optic neuritis: Secondary | ICD-10-CM

## 2016-01-29 DIAGNOSIS — G35 Multiple sclerosis: Secondary | ICD-10-CM

## 2016-01-29 DIAGNOSIS — R260 Ataxic gait: Secondary | ICD-10-CM

## 2016-01-29 DIAGNOSIS — E038 Other specified hypothyroidism: Secondary | ICD-10-CM

## 2016-01-29 NOTE — Progress Notes (Signed)
[facility]  PATIENT: Taylor Donovan DOB: 06/07/1990  REFERRING DOCTOR OR PCP:  Vicenta Aly, FNP SOURCE: patient, notes from ED/hospital stay, lab results, imaging results, MRI images on PACS  _________________________________   HISTORICAL  CHIEF COMPLAINT:  Chief Complaint  Patient presents with  . Abnormal MRI Brain    Taylor Donovan is here for eval of visual disturbance--left eye pain and partial vision loss.  Sts. she was evaluated at [facility], admitted for 3 days of IV steroids.  Sts. MRI brain was abnormal--she is here to r/o MS/fim  . Visual Disturbance    HISTORY OF PRESENT ILLNESS:  I had the pleasure seeing you patient, Taylor Donovan, at [facility] for neurologic consultation regarding her recent left optic neuritis and concern about multiple sclerosis.  As you know, she is a 25 year old woman who had the onset of visual changes around 01/04/2016. Initially she had pain behind the left eye that was worse when she moved her eyes around. Then she began to note blurry vision the next day, she could barely see anything out of the left eye.  She still had peripheral light perception. She saw Dr. Katy Fitch who diagnosed left optic neuritis and sent her to the emergency room. She presented on 01/07/2016. In the emergency room, she had an MRI of the brain that showed several other foci, worrisome for multiple sclerosis. One of the foci is in the right middle cerebellar peduncle and a couple of the foci are periventricular. She was admitted for 3 days of IV steroids and discharged on a prednisone taper. On her third day of treatment, she felt that there was some improvement in her vision. Vision has continued to improve and she was 20/20 on recent exam though she still notes that vision is blurred and there is less contrast. Color vision is reduced out of the left eye.   The eye pain has resolved.   There is no  diplopia.  Gait/strength/sensation: She denies any significant problems with her gait.  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 This has improved a little bit but she still feels more clumsy now than she did earlier this year. She denies any significant weakness or numbness in the arms or legs.  Bladder/bowel: She notes some urinary frequency and has nocturia twice most nights. The nocturia began this year. She notes some constipation.  Fatigue/sleep: She notes fatigue on a daily basis that is worse as the day goes on. She always feels sleepy. She notes that she sleeps well most nights. Her husband notes that she snores but has not noted any gasping or pauses in breathing.  Mood/Cognition: She notes that she has some irritability but no significant depression or anxiety. She has no major cognitive issues but she has had some problems with verbal fluency. The difficulties with verbal fluency began this year.  I personally reviewed the MRI of the brain dated 01/07/2016. There are several T2/FLAIR hyperintense foci, one is in the right middle cerebellar peduncle and a couple are in the periventricular white matter. Other foci are in the subcortical deep white matter. None of the foci enhanced after contrast admission to the hospital.   I also reviewed the MRI of the orbits performed that day. That MRI was normal.   I reviewed the admission and discharge notes from her emergency room visit and hospital stay this month.   TSH  was elevated and B12 was reduced.   ESR, CRP, RPR, Lyme Abs/DNA, ANA were negative.     REVIEW OF SYSTEMS: Constitutional: No fevers, chills, sweats, or change in appetite. She notes fatigue and some sleepiness. Eyes: as above Ear, nose and throat: No hearing loss, ear pain, nasal congestion, sore throat Cardiovascular: No chest pain, palpitations Respiratory: No shortness of breath at  rest or with exertion.   No wheezes.   She snores. GastrointestinaI: No nausea, vomiting, diarrhea, abdominal pain, fecal incontinence Genitourinary: No dysuria, urinary retention or frequency.  2 x nocturia. Musculoskeletal: No neck pain, back pain Integumentary: No rash, pruritus, skin lesions Neurological: as above Psychiatric: No depression at this time.  No anxiety Endocrine: No palpitations, diaphoresis, change in appetite, change in weigh or increased thirst Hematologic/Lymphatic: No anemia, purpura, petechiae. Allergic/Immunologic: No itchy/runny eyes, nasal congestion, recent allergic reactions, rashes  ALLERGIES: No Known Allergies  HOME MEDICATIONS:  Current Outpatient Prescriptions:  .  Cyanocobalamin (B-12 COMPLIANCE INJECTION) 1000 MCG/ML KIT, Inject 1,000 mcg as directed once a week., Disp: 4 kit, Rfl: 0 .  levonorgestrel-ethinyl estradiol (ORSYTHIA) 0.1-20 MG-MCG tablet, Take 1 tablet by mouth daily., Disp: , Rfl:  .  acetaminophen (TYLENOL) 325 MG tablet, Take 2 tablets (650 mg total) by mouth every 6 (six) hours as needed for mild pain (or Fever >/= 101). (Patient not taking: Reported on 01/29/2016), Disp: 30 tablet, Rfl: 0 .  predniSONE (DELTASONE) 10 MG tablet, Taper prednisone such that you will take prednisone 50 mg 01/09/2016 then 40 mg 01/10/2016, then 30 mg on 01/11/2016 and so on until you complete to 0 mg, then stop (Patient not taking: Reported on 01/29/2016), Disp: 15 tablet, Rfl: 0 .  traMADol (ULTRAM) 50 MG tablet, Take 2 tablets (100 mg total) by mouth every 4 (four) hours as needed for moderate pain (or Headache unrelieved by tylenol). (Patient not taking: Reported on 01/29/2016), Disp: 15 tablet, Rfl: 0  PAST MEDICAL HISTORY: Past Medical History:  Diagnosis Date  . Acid reflux   . Depression   . Hypothyroid   . Ovarian cyst   . PCOS (polycystic ovarian syndrome)   . Thyroid disease   . Vision abnormalities     PAST SURGICAL HISTORY: No past  surgical history on file.  FAMILY HISTORY: Family History  Problem Relation Age of Onset  . Hypertension Mother   . Diabetes Mellitus II Mother   . Healthy Father     SOCIAL HISTORY:  Social History   Social History  . Marital status: Single    Spouse name: N/A  . Number of children: N/A  . Years of education: N/A   Occupational History  . Not on file.   Social History Main Topics  . Smoking status: Current Every Day Smoker    Packs/day: 0.50    Years: 8.00    Types: Cigarettes  . Smokeless tobacco: Never Used  . Alcohol use Yes     Comment: occasionally  . Drug use: No  . Sexual activity: Yes    Birth control/ protection: Pill   Other Topics Concern  . Not on file   Social History Narrative  . No narrative on file     PHYSICAL EXAM  Vitals:   01/29/16 1310  BP: 138/84  Pulse: 92  Resp: 16  Weight: 235 lb (106.6 kg)  Height: 5' 7" (1.702 m)    Body mass index is 36.81 kg/m.   General: The patient is well-developed and well-nourished and in no acute  distress  Eyes:  Funduscopic exam shows normal optic discs and retinal vessels.  Neck: The neck is supple, no carotid bruits are noted.  The neck is nontender.  Cardiovascular: The heart has a regular rate and rhythm with a normal S1 and S2. There were no murmurs, gallops or rubs. Lungs are clear to auscultation.  Skin: Extremities are without significant edema.  Musculoskeletal:  Back is nontender  Neurologic Exam  Mental status: The patient is alert and oriented x 3 at the time of the examination. The patient has apparent normal recent and remote memory, with an apparently normal attention span and concentration ability.   Speech is normal.  Cranial nerves: Extraocular movements are full. Pupils Showed 2+ left Afferent Pupillary defect. Color vision is reduced out of the left eye..  Visual fields are full to confrontation.  Facial symmetry is present. There is good facial sensation to soft touch  bilaterally.Facial strength is normal.  Trapezius and sternocleidomastoid strength is normal. No dysarthria is noted.  The tongue is midline, and the patient has symmetric elevation of the soft palate. No obvious hearing deficits are noted.  Motor:  Muscle bulk is normal.   Tone is normal. Strength is  5 / 5 in all 4 extremities.   Sensory: Sensory testing was reduced to temperature and touch sensation in the right arm relative to the left arm. She was symmetric in the arms or legs..  Coordination: Cerebellar testing reveals good finger-nose-finger and heel-to-shin bilaterally.  Gait and station: Station is normal.   Gait is normal. Tandem gait is mildly wide. Romberg is negative.   Reflexes: Deep tendon reflexes are symmetric and normal bilaterally.   Plantar responses are flexor.    DIAGNOSTIC DATA (LABS, IMAGING, TESTING) - I reviewed patient records, labs, notes, testing and imaging myself where available.  Lab Results  Component Value Date   WBC 8.2 01/08/2016   HGB 13.6 01/08/2016   HCT 40.4 01/08/2016   MCV 86.3 01/08/2016   PLT 309 01/08/2016      Component Value Date/Time   NA 139 01/07/2016 1832   K 3.8 01/07/2016 1832   CL 106 01/07/2016 1832   CO2 26 01/07/2016 1832   GLUCOSE 98 01/07/2016 1832   BUN 7 01/07/2016 1832   CREATININE 0.85 01/08/2016 1055   CALCIUM 9.1 01/07/2016 1832   PROT 6.7 01/07/2016 1832   ALBUMIN 3.9 01/07/2016 1832   AST 15 01/07/2016 1832   ALT 19 01/07/2016 1832   ALKPHOS 64 01/07/2016 1832   BILITOT 0.2 (L) 01/07/2016 1832   GFRNONAA >60 01/08/2016 1055   GFRAA >60 01/08/2016 1055   No results found for: CHOL, HDL, LDLCALC, LDLDIRECT, TRIG, CHOLHDL No results found for: HGBA1C Lab Results  Component Value Date   VITAMINB12 153 (L) 01/08/2016   Lab Results  Component Value Date   TSH 6.421 (H) 01/08/2016       ASSESSMENT AND PLAN  Vision loss, left eye  Multiple sclerosis (Amanda)  Ataxic gait  Optic  neuritis  Hypothyroidism due to Hashimoto's thyroiditis    In summary, Ms. Haig Prophet is a 25 year old woman with optic neuritis earlier this month.   The optic neuritis, combined with the abnormal MRI consistent with multiple sclerosis, the onset of clumsiness earlier this year and her abnormal examination showing objective findings with visual disturbance, left APD, sensory asymmetry and gait ataxia is consistent with clinically definite multiple sclerosis.   I recommended that she begin a disease modifying therapy and we discussed options.  She was most interested in enrolling in one part 2 MS studies. One compares a new medication (ALK 8700) with Tecfidera. The other compares a new medication, ofatumumab, with Aubagio.    We will review the inclusion and estrogen criteria and she will discuss the studies with our research coordinator. She will be seeing her primary care provider about reinitiating Synthroid for her hypothyroidism and she completes her B12 injections later this month. I have asked her to begin vitamin D OTC 5000 units daily.  We discussed that if her sleepiness worsens consider a sleep study. She is advised to try to lose weight if possible.   Depending on the study, she will return for a visit but call sooner if she has any new or worsening neurologic symptoms. If she opts not to go into one of the studies we will begin either Tecfidera or Aubagio for her MS.  Thank you for asking me to see Ms. Locklear for a neurologic consultation. Please let me know if I can be of further assistance with her or other patients in the future.     Richard A. Felecia Shelling, MD, PhD 87/68/1157, 2:62 PM Certified in Neurology, Clinical Neurophysiology, Sleep Medicine, Pain Medicine and Neuroimaging  [facility] [address] [place], [place] [address] [phone]

## 2016-02-11 ENCOUNTER — Ambulatory Visit (INDEPENDENT_AMBULATORY_CARE_PROVIDER_SITE_OTHER): Payer: Self-pay | Admitting: Neurology

## 2016-02-11 DIAGNOSIS — G35 Multiple sclerosis: Secondary | ICD-10-CM

## 2016-02-11 NOTE — Progress Notes (Signed)
Taylor Donovan was in today for the screening visit for the Alkermes 8700 drug study

## 2016-02-14 NOTE — Addendum Note (Signed)
Addended by: Despina AriasSATER, Absalom Aro A on: 02/14/2016 12:23 PM   Modules accepted: Orders

## 2016-02-25 ENCOUNTER — Telehealth: Payer: Self-pay | Admitting: Neurology

## 2016-02-25 NOTE — Telephone Encounter (Signed)
LMTC./fim 

## 2016-02-25 NOTE — Telephone Encounter (Signed)
Patient is returning a call from Dr. Epimenio Foot regarding her thyroid disease.

## 2016-03-05 ENCOUNTER — Ambulatory Visit
Admission: RE | Admit: 2016-03-05 | Discharge: 2016-03-05 | Disposition: A | Discharge status: Home / Self Care | Payer: No Typology Code available for payment source | Source: Ambulatory Visit | Attending: Neurology | Admitting: Neurology

## 2016-03-05 DIAGNOSIS — G35 Multiple sclerosis: Secondary | ICD-10-CM

## 2016-03-10 ENCOUNTER — Encounter: Payer: Self-pay | Admitting: *Deleted

## 2016-03-10 ENCOUNTER — Ambulatory Visit (INDEPENDENT_AMBULATORY_CARE_PROVIDER_SITE_OTHER): Payer: Self-pay | Admitting: Neurology

## 2016-03-10 DIAGNOSIS — G35 Multiple sclerosis: Secondary | ICD-10-CM

## 2016-03-10 NOTE — Progress Notes (Signed)
Due to having some nausea, she does not qualify for the [id number] study.   I don't think that the nausea is that bad and we will see if we can get her started on Tecfidera. If she is unable to tolerate that, we'll try one of the other oral agents.

## 2016-03-21 ENCOUNTER — Telehealth: Payer: Self-pay | Admitting: Neurology

## 2016-03-21 NOTE — Telephone Encounter (Signed)
Pt called stated her left eye is numb, starting under her eyebrow right above her cheek bone.  No drooping.  Pt states this started yesterday between 11:00am-12:00pm.  When touching it tingles. Please call

## 2016-03-21 NOTE — Telephone Encounter (Signed)
I have spoken with [facility] again.  She sts. numbness is not getting any worse since onset.  She clarifies that although she is not having pain, eye is some sore to touch "but not anything like I had last month."  Per  RAS, I have advised numbness likely does not represent an MS exacerbation.  If it worsens, she should call our office back, with potential plan of receiving IV SM next week.  She verbalized understanding of same and is agreeable with this plan/fim

## 2016-03-21 NOTE — Telephone Encounter (Signed)
I have spoken with Taylor Donovan this morning. She c/o numbness left eyelid. No pain. No increased visual disturbance.  Not on MS med currently--she didn't qualify for a drug study and has not received Tecfidera yet (start form sent in 03-10-16.)  NKDA, No diabetes, hx. Hashimotos.  Will check with RAS and call her back/fim

## 2016-04-11 ENCOUNTER — Encounter: Payer: Self-pay | Admitting: *Deleted

## 2016-04-15 ENCOUNTER — Ambulatory Visit: Payer: Self-pay | Admitting: Neurology

## 2016-04-22 ENCOUNTER — Telehealth: Payer: Self-pay | Admitting: Neurology

## 2016-04-28 ENCOUNTER — Telehealth: Payer: Self-pay | Admitting: Neurology

## 2016-04-28 NOTE — Telephone Encounter (Signed)
I have spoken with Farrah this morning.  She sts. she called [employer] to provide new ins. info and was told she needs to check with her ins. co. to see if Tecfidera was covered.  I have spoken with Corrie Dandy at [facility] this morning.  She confirms that pt. called with new ins. info, but sts. benefits investigation has not been done.  She believes rx. was faxed to [facility], but sts. a reply was never received from [employer], so she will reach out to them to ensure they received Tecfidera rx.  She will ask that a benefits investigation be started to make sure Tecfidera is covered with pt's new ins/fim

## 2016-04-28 NOTE — Telephone Encounter (Signed)
I have spoken with Rupal this afternoon, and explained that I spoke with [employer].  They will initiate a benefits investigation and then reach out to pt. to offer any pt. assistance/copay assistance that she needs and qualifies for.  At that time, if a new rx. or PA is required by the pharmacy, I will complete this/fim

## 2016-04-28 NOTE — Telephone Encounter (Signed)
Pt called says she has not rec'd Tecfidera. She has talked with [employer] and her insurance company. Pt has new insurance and RX needs to be sent to:  [phone] ID# [id number] GRP# [id number] Pt has been added to her boyfriends insurance. Pt does not have the actual card, it is being mailed to her. She is not sure if this includes prescription

## 2016-04-28 NOTE — Telephone Encounter (Signed)
Pt's boyfriend, Janyth Pupa [phone] called back. He called [facility], said prescription is thru secondary company:  [facility] RX info ID# [id number] GRP# [id number]

## 2016-06-02 ENCOUNTER — Encounter: Payer: Self-pay | Admitting: *Deleted

## 2016-06-02 ENCOUNTER — Telehealth: Payer: Self-pay | Admitting: *Deleted

## 2016-06-02 NOTE — Telephone Encounter (Signed)
Tecfidera PA has been denied, as pt. has not tried Glatiramer.  Pt. is unable to self inject.  Appeals letter noting this  has been faxed to [employer], fax# [phone].  Pt. aware.  Pt. sts. she is having left eye pain, blurry vision.  Per RAS, ok for SM 1gm IV daily for 3 days.  Orders given to Mindy in the infusion suite.  Pt. will come in between 1430-1500 tomorrow for day one infusion/fim

## 2016-06-08 ENCOUNTER — Telehealth: Payer: Self-pay | Admitting: Neurology

## 2016-06-08 MED ORDER — CYCLOBENZAPRINE HCL 5 MG PO TABS: 5.0000 mg | ORAL_TABLET | Freq: Three times a day (TID) | ORAL | 1 refills | Status: DC | PRN

## 2016-06-08 MED ORDER — ETODOLAC 400 MG PO TABS: 400.0000 mg | ORAL_TABLET | Freq: Two times a day (BID) | ORAL | 5 refills | Status: DC

## 2016-06-08 NOTE — Telephone Encounter (Signed)
She had steroid infusion 2 days a go and started feeling achy yesterday, more so today.   Similar thing happened last year with IV steroids.   No UTI symptoms.    I will call in prn etodolac and cyclobenzaprine.   If not worse tomorrow, she will call.

## 2016-06-10 ENCOUNTER — Ambulatory Visit (INDEPENDENT_AMBULATORY_CARE_PROVIDER_SITE_OTHER): Payer: 59 | Admitting: Neurology

## 2016-06-10 ENCOUNTER — Encounter: Payer: Self-pay | Admitting: Neurology

## 2016-06-10 ENCOUNTER — Encounter (INDEPENDENT_AMBULATORY_CARE_PROVIDER_SITE_OTHER): Payer: Self-pay

## 2016-06-10 VITALS — BP 126/74 | HR 68 | Resp 14 | Ht 67.0 in | Wt 234.0 lb

## 2016-06-10 DIAGNOSIS — G35 Multiple sclerosis: Secondary | ICD-10-CM | POA: Diagnosis not present

## 2016-06-10 DIAGNOSIS — H469 Unspecified optic neuritis: Secondary | ICD-10-CM | POA: Diagnosis not present

## 2016-06-10 DIAGNOSIS — H5462 Unqualified visual loss, left eye, normal vision right eye: Secondary | ICD-10-CM | POA: Diagnosis not present

## 2016-06-10 DIAGNOSIS — R3915 Urgency of urination: Secondary | ICD-10-CM | POA: Diagnosis not present

## 2016-06-10 DIAGNOSIS — G4700 Insomnia, unspecified: Secondary | ICD-10-CM | POA: Insufficient documentation

## 2016-06-10 MED ORDER — GABAPENTIN 300 MG PO CAPS: ORAL_CAPSULE | ORAL | 11 refills | Status: DC

## 2016-06-10 MED ORDER — OXYBUTYNIN CHLORIDE 5 MG PO TABS: 5.0000 mg | ORAL_TABLET | Freq: Two times a day (BID) | ORAL | 5 refills | Status: DC

## 2016-06-10 NOTE — Progress Notes (Signed)
[facility]  PATIENT: Taylor Donovan DOB: Aug 13, 1990  REFERRING DOCTOR OR PCP:  Vicenta Aly, FNP SOURCE: patient, notes from ED/hospital stay, lab results, imaging results, MRI images on PACS  _________________________________   HISTORICAL  CHIEF COMPLAINT:  Chief Complaint  Patient presents with  . Multiple Sclerosis    Sts. has not heard anything from [facility] or [employer] about starting Tecfidera.  Sts. she is feeling better, but still has some blurry vision, mainly left eye.  She c/o painful edema after infusions/fim    HISTORY OF PRESENT ILLNESS:  Taylor Donovan is a 26 year old woman with MS reporting new left sided visual changes  About 10 days ago, she had the onset of left visual blurriness associated with left eye pain.    Pain worseend with moving her eyes.   She had flu-like symptoms and neck stiffness with her IV steroid treatment.    She had similar but milder symptoms the first time she was on IV Steroids.   Etodolac and Flexeril helped her myalgias.     MS:   She did not qualify for a drug study and we have had trouble getting her started on Tecfidera.   She does not feel she could self inject and so can not be on interferon or glatiramer.  We were finally able to get approval for Tecfidera and she should be getting her first shipment within a week.    Gait/strength/sensation: She feels mildly clumsy with her gait but has no falls. 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  . She denies any significant weakness or numbness in the arms or legs.  Bladder/bowel: She notes urinary frequency with extreme urgency a times and has nocturia twice most nights.  She has rare incontinence when she can't get to bathroom in time (happened once at work).   She notes some constipation.  Fatigue/sleep: She notes fatigue on a daily basis that is worse as the day goes on. She always feels sleepy. She  falls asleep easily and will get a couple hours of sleep. However, once she wakes up she has difficulty falling back asleep. This has been present the last year. He did worse while on steroids.   She denies RLS.    Mood/Cognition: She denies much depression or anxiety. She has no major cognitive issues but she has had some problems with verbal fluency. The difficulties with verbal fluency began this year.  MS History:   She presented with left optic neuritis in October 2017.   MRI of the brain showed several other foci, worrisome for multiple sclerosis. One of the foci is in the right middle cerebellar peduncle and a couple of the foci are periventricular. I personally reviewed the MRI of the brain dated 01/07/2016. There are several T2/FLAIR hyperintense foci, one is in the right middle cerebellar peduncle and a couple are in the periventricular white matter. Other foci are in the subcortical deep white matter. None of the foci enhanced after contrast admission to the hospital.   I also reviewed the MRI of the orbits performed that day. That MRI was normal.   I reviewed the admission and discharge notes from her emergency room visit and hospital stay this month.   TSH was elevated and B12 was reduced.   ESR, CRP, RPR, Lyme Abs/DNA, ANA were negative.     REVIEW OF SYSTEMS: Constitutional: No fevers, chills, sweats, or change in appetite. She notes fatigue and some  sleepiness. Eyes: as above Ear, nose and throat: No hearing loss, ear pain, nasal congestion, sore throat Cardiovascular: No chest pain, palpitations Respiratory: No shortness of breath at rest or with exertion.   No wheezes.   She snores. GastrointestinaI: No nausea, vomiting, diarrhea, abdominal pain, fecal incontinence Genitourinary: No dysuria, urinary retention or frequency.  2 x nocturia. Musculoskeletal: No neck pain, back pain Integumentary: No rash, pruritus, skin lesions Neurological: as above Psychiatric: No depression at  this time.  No anxiety Endocrine: No palpitations, diaphoresis, change in appetite, change in weigh or increased thirst Hematologic/Lymphatic: No anemia, purpura, petechiae. Allergic/Immunologic: No itchy/runny eyes, nasal congestion, recent allergic reactions, rashes  ALLERGIES: No Known Allergies  HOME MEDICATIONS:  Current Outpatient Prescriptions:  .  acetaminophen (TYLENOL) 325 MG tablet, Take 2 tablets (650 mg total) by mouth every 6 (six) hours as needed for mild pain (or Fever >/= 101)., Disp: 30 tablet, Rfl: 0 .  cyclobenzaprine (FLEXERIL) 5 MG tablet, Take 1 tablet (5 mg total) by mouth every 8 (eight) hours as needed for muscle spasms., Disp: 30 tablet, Rfl: 1 .  etodolac (LODINE) 400 MG tablet, Take 1 tablet (400 mg total) by mouth 2 (two) times daily., Disp: 60 tablet, Rfl: 5 .  levonorgestrel-ethinyl estradiol (ORSYTHIA) 0.1-20 MG-MCG tablet, Take 1 tablet by mouth daily., Disp: , Rfl:  .  SYNTHROID 125 MCG tablet, TAKE 1 TABLET ON AN EMPTY STOMACH IN THE MORNING ONCE A DAY ORALLY 30 DAY(S), Disp: , Rfl: 5 .  traMADol (ULTRAM) 50 MG tablet, Take 2 tablets (100 mg total) by mouth every 4 (four) hours as needed for moderate pain (or Headache unrelieved by tylenol)., Disp: 15 tablet, Rfl: 0 .  Dimethyl Fumarate 120 & 240 MG MISC, Take by mouth 2 (two) times daily., Disp: , Rfl:  .  Dimethyl Fumarate 240 MG CPDR, Take 240 mg by mouth 2 (two) times daily., Disp: , Rfl:  .  gabapentin (NEURONTIN) 300 MG capsule, One or 2 po qHS, Disp: 60 capsule, Rfl: 11 .  oxybutynin (DITROPAN) 5 MG tablet, Take 1 tablet (5 mg total) by mouth 2 (two) times daily., Disp: 60 tablet, Rfl: 5  PAST MEDICAL HISTORY: Past Medical History:  Diagnosis Date  . Acid reflux   . Depression   . Hypothyroid   . Ovarian cyst   . PCOS (polycystic ovarian syndrome)   . Thyroid disease   . Vision abnormalities     PAST SURGICAL HISTORY: No past surgical history on file.  FAMILY HISTORY: Family History    Problem Relation Age of Onset  . Hypertension Mother   . Diabetes Mellitus II Mother   . Healthy Father     SOCIAL HISTORY:  Social History   Social History  . Marital status: Single    Spouse name: N/A  . Number of children: N/A  . Years of education: N/A   Occupational History  . Not on file.   Social History Main Topics  . Smoking status: Current Every Day Smoker    Packs/day: 0.50    Years: 8.00    Types: Cigarettes  . Smokeless tobacco: Never Used  . Alcohol use Yes     Comment: occasionally  . Drug use: No  . Sexual activity: Yes    Birth control/ protection: Pill   Other Topics Concern  . Not on file   Social History Narrative  . No narrative on file     PHYSICAL EXAM  Vitals:   06/10/16 1512  BP:  126/74  Pulse: 68  Resp: 14  Weight: 234 lb (106.1 kg)  Height: _0  (1.702 m)    Body mass index is 36.65 kg/m.   General: The patient is well-developed and well-nourished and in no acute distress   Neurologic Exam  Mental status: The patient is alert and oriented x 3 at the time of the examination. The patient has apparent normal recent and remote memory, with an apparently normal attention span and concentration ability.   Speech is normal.  Cranial nerves: Extraocular movements are full. She has a 2+ left APD.Marland Kitchen Color vision is reduced out of the left eye..  Visual fields are full to confrontation but acuity is mildly reduced on the left.  She has normal facial strength and sensation.  Trapezius and sternocleidomastoid strength is normal. No dysarthria is noted.  The tongue is midline, and the patient has symmetric elevation of the soft palate. No obvious hearing deficits are noted.  Motor:  Muscle bulk is normal.   Tone is normal. Strength is  5 / 5 in all 4 extremities.   Sensory: She reported symmetric touch and vibration sensation in the arms and legs.  Coordination: Cerebellar testing reveals good finger-nose-finger and heel-to-shin  bilaterally.  Gait and station: Station is normal.   Gait is normal. Tandem gait is mildly wide. Romberg is negative.   Reflexes: Deep tendon reflexes are symmetric and normal bilaterally.       DIAGNOSTIC DATA (LABS, IMAGING, TESTING) - I reviewed patient records, labs, notes, testing and imaging myself where available.  Lab Results  Component Value Date   WBC 8.2 01/08/2016   HGB 13.6 01/08/2016   HCT 40.4 01/08/2016   MCV 86.3 01/08/2016   PLT 309 01/08/2016      Component Value Date/Time   NA 139 01/07/2016 1832   K 3.8 01/07/2016 1832   CL 106 01/07/2016 1832   CO2 26 01/07/2016 1832   GLUCOSE 98 01/07/2016 1832   BUN 7 01/07/2016 1832   CREATININE 0.85 01/08/2016 1055   CALCIUM 9.1 01/07/2016 1832   PROT 6.7 01/07/2016 1832   ALBUMIN 3.9 01/07/2016 1832   AST 15 01/07/2016 1832   ALT 19 01/07/2016 1832   ALKPHOS 64 01/07/2016 1832   BILITOT 0.2 (L) 01/07/2016 1832   GFRNONAA >60 01/08/2016 1055   GFRAA >60 01/08/2016 1055   No results found for: CHOL, HDL, LDLCALC, LDLDIRECT, TRIG, CHOLHDL No results found for: HGBA1C Lab Results  Component Value Date   VITAMINB12 153 (L) 01/08/2016   Lab Results  Component Value Date   TSH 6.421 (H) 01/08/2016       ASSESSMENT AND PLAN  Multiple sclerosis (HCC)  Optic neuritis  Vision loss of left eye  Insomnia, unspecified type  Urinary urgency    1.   She was finally approved for the Tecfidera. She should be getting her first appointment of medications within a week. She is advised to call us back in a week if administration or notice of being sent. 2.     Oxybutynin 5 mg twice a day for her bladder and gabapentin 300-600 mg nightly for sleep maintenance insomnia. 3.    Return in 4 months or sooner if there are new or worsening neurologic symptoms.   Dawnetta Copenhaver A. Felecia Shelling, MD, PhD 6/55/3748, 2:70 PM Certified in Neurology, Clinical Neurophysiology, Sleep Medicine, Pain Medicine and Neuroimaging  [facility] [address] [place], [place] [address] [phone]

## 2016-06-21 ENCOUNTER — Other Ambulatory Visit: Payer: Self-pay | Admitting: Neurology

## 2016-08-12 ENCOUNTER — Telehealth: Payer: Self-pay | Admitting: *Deleted

## 2016-08-12 MED ORDER — OXYBUTYNIN CHLORIDE 5 MG PO TABS: 5.0000 mg | ORAL_TABLET | Freq: Two times a day (BID) | ORAL | 3 refills | Status: DC

## 2016-08-12 MED ORDER — GABAPENTIN 300 MG PO CAPS: ORAL_CAPSULE | ORAL | 3 refills | Status: DC

## 2016-08-12 NOTE — Telephone Encounter (Signed)
90 day rx's for Gabapentin and Oxybutynin escribed to [facility] as requested/fim

## 2016-10-16 ENCOUNTER — Ambulatory Visit: Payer: 59 | Admitting: Neurology

## 2016-11-29 ENCOUNTER — Encounter (HOSPITAL_COMMUNITY): Payer: Self-pay | Admitting: *Deleted

## 2016-11-29 ENCOUNTER — Emergency Department (HOSPITAL_COMMUNITY): Payer: 59

## 2016-11-29 ENCOUNTER — Emergency Department (HOSPITAL_COMMUNITY)
Admission: EM | Admit: 2016-11-29 | Discharge: 2016-11-29 | Disposition: A | Discharge status: Home / Self Care | Payer: 59 | Attending: Emergency Medicine | Admitting: Emergency Medicine

## 2016-11-29 DIAGNOSIS — Z79899 Other long term (current) drug therapy: Secondary | ICD-10-CM | POA: Diagnosis not present

## 2016-11-29 DIAGNOSIS — F17210 Nicotine dependence, cigarettes, uncomplicated: Secondary | ICD-10-CM | POA: Diagnosis not present

## 2016-11-29 DIAGNOSIS — E039 Hypothyroidism, unspecified: Secondary | ICD-10-CM | POA: Insufficient documentation

## 2016-11-29 DIAGNOSIS — M545 Low back pain, unspecified: Secondary | ICD-10-CM

## 2016-11-29 DIAGNOSIS — R102 Pelvic and perineal pain: Secondary | ICD-10-CM | POA: Diagnosis not present

## 2016-11-29 LAB — CBC WITH DIFFERENTIAL/PLATELET
BASOS ABS: 0 10*3/uL (ref 0.0–0.1)
BASOS PCT: 0 %
Eosinophils Absolute: 0.1 10*3/uL (ref 0.0–0.7)
Eosinophils Relative: 2 %
HEMATOCRIT: 34.9 % — AB (ref 36.0–46.0)
HEMOGLOBIN: 12.1 g/dL (ref 12.0–15.0)
Lymphocytes Relative: 21 %
Lymphs Abs: 1.4 10*3/uL (ref 0.7–4.0)
MCH: 29.7 pg (ref 26.0–34.0)
MCHC: 34.7 g/dL (ref 30.0–36.0)
MCV: 85.7 fL (ref 78.0–100.0)
MONO ABS: 0.5 10*3/uL (ref 0.1–1.0)
Monocytes Relative: 7 %
NEUTROS ABS: 4.7 10*3/uL (ref 1.7–7.7)
NEUTROS PCT: 70 %
Platelets: 245 10*3/uL (ref 150–400)
RBC: 4.07 MIL/uL (ref 3.87–5.11)
RDW: 12 % (ref 11.5–15.5)
WBC: 6.7 10*3/uL (ref 4.0–10.5)

## 2016-11-29 LAB — URINALYSIS, ROUTINE W REFLEX MICROSCOPIC
BACTERIA UA: NONE SEEN
BILIRUBIN URINE: NEGATIVE
GLUCOSE, UA: NEGATIVE mg/dL
Ketones, ur: NEGATIVE mg/dL
LEUKOCYTES UA: NEGATIVE
NITRITE: NEGATIVE
Protein, ur: NEGATIVE mg/dL
SPECIFIC GRAVITY, URINE: 1.027 (ref 1.005–1.030)
pH: 6 (ref 5.0–8.0)

## 2016-11-29 LAB — COMPREHENSIVE METABOLIC PANEL
ALK PHOS: 59 U/L (ref 38–126)
ALT: 14 U/L (ref 14–54)
AST: 16 U/L (ref 15–41)
Albumin: 4.2 g/dL (ref 3.5–5.0)
Anion gap: 6 (ref 5–15)
BILIRUBIN TOTAL: 0.2 mg/dL — AB (ref 0.3–1.2)
BUN: 14 mg/dL (ref 6–20)
CALCIUM: 8.8 mg/dL — AB (ref 8.9–10.3)
CO2: 25 mmol/L (ref 22–32)
Chloride: 106 mmol/L (ref 101–111)
Creatinine, Ser: 0.84 mg/dL (ref 0.44–1.00)
GFR calc non Af Amer: >60 mL/min (ref >60)
Glucose, Bld: 107 mg/dL — ABNORMAL HIGH (ref 65–99)
Potassium: 3.9 mmol/L (ref 3.5–5.1)
Sodium: 137 mmol/L (ref 135–145)
TOTAL PROTEIN: 7 g/dL (ref 6.5–8.1)

## 2016-11-29 LAB — POC URINE PREG, ED: PREG TEST UR: NEGATIVE

## 2016-11-29 MED ORDER — SODIUM CHLORIDE 0.9 % IV BOLUS (SEPSIS)
1000.0000 mL | Freq: Once | INTRAVENOUS | Status: AC
  Administered 2016-11-29: 1000 mL via INTRAVENOUS

## 2016-11-29 MED ORDER — CYCLOBENZAPRINE HCL 10 MG PO TABS: 10.0000 mg | ORAL_TABLET | Freq: Three times a day (TID) | ORAL | 0 refills | Status: DC | PRN

## 2016-11-29 MED ORDER — MORPHINE SULFATE (PF) 10 MG/ML IV SOLN
10.0000 mg | Freq: Once | INTRAVENOUS | Status: AC
  Administered 2016-11-29: 10 mg via INTRAVENOUS
  Filled 2016-11-29: qty 1

## 2016-11-29 NOTE — ED Triage Notes (Signed)
Pt complains of pain above clitoris for the past 2 days, which has spread to her hips, right flank and lower back. Pt went to her OBGYN to be evaluated, who did not find anything. Pt has not noticed any vaginal discharge.

## 2016-11-29 NOTE — ED Notes (Signed)
Patient transported to CT 

## 2016-11-29 NOTE — ED Provider Notes (Signed)
[facility]-EMERGENCY DEPT Provider Note   CSN: [id number] Arrival date & time: 11/29/16  1552     History   Chief Complaint Chief Complaint  Patient presents with  . Vaginal Pain  . Flank Pain  . Hip Pain    HPI Taylor Donovan is a 26 y.o. female.  HPI  26 year old female with a history of hypothyroidism, MS, PCOS presents with Pain above her clitoris and flank pain. She's also having bilateral hip pain. She states that 2 days ago she noticed pain above her clitoris. However it seemed like it was pain inside and she could not reproduce it by touching. There was no rash. No vaginal bleeding or discharge. Is also having bilateral hip pain since yesterday as well as right flank pain and diffuse back pain. There has been no back injury. Movement does not seem to make it better or worse. The pain is mostly constant. She's taken ibuprofen with no relief. There is no fever, chest symptoms, cough, nausea, vomiting, or diarrhea. She has no urinary symptoms. She states that she went to her OB/GYN yesterday and had a normal exam. She had a urinalysis done but was told she did not have a UTI. No weakness/numbness.   Past Medical History:  Diagnosis Date  . Acid reflux   . Depression   . Hypothyroid   . Ovarian cyst   . PCOS (polycystic ovarian syndrome)   . Thyroid disease   . Vision abnormalities     Patient Active Problem List   Diagnosis Date Noted  . Insomnia 06/10/2016  . Urinary urgency 06/10/2016  . Multiple sclerosis (HCC) 01/29/2016  . 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 Vision loss of left eye 01/08/2016  . Abnormal brain MRI 01/08/2016  . Hypothyroidism 01/08/2016  . Vision loss, left eye 01/08/2016  . HASHIMOTO'S THYROIDITIS 08/13/2009  . DYSPNEA ON EXERTION 08/13/2009    History reviewed. No pertinent surgical history.  OB History    No data available       Home Medications    Prior to Admission medications   Medication Sig Start Date End  Date Taking? Authorizing Provider  Dimethyl Fumarate 240 MG CPDR Take 240 mg by mouth 2 (two) times daily.   Yes [provider]  diphenhydrAMINE (BENADRYL) 25 mg capsule Take 50 mg by mouth at bedtime as needed for sleep.   Yes [provider]  gabapentin (NEURONTIN) 300 MG capsule One or 2 po qHS 08/12/16  Yes Sater, Pearletha Furl, MD  ibuprofen (ADVIL,MOTRIN) 800 MG tablet Take 800 mg by mouth every 8 (eight) hours as needed for moderate pain.   Yes [provider]  levonorgestrel-ethinyl estradiol (ORSYTHIA) 0.1-20 MG-MCG tablet Take 1 tablet by mouth daily.   Yes [provider]  oxybutynin (DITROPAN) 5 MG tablet Take 1 tablet (5 mg total) by mouth 2 (two) times daily. 08/12/16  Yes Sater, Pearletha Furl, MD  SYNTHROID 125 MCG tablet TAKE 1 TABLET ON AN EMPTY STOMACH IN THE MORNING ONCE A DAY ORALLY 30 DAY(S) 05/10/16  Yes [provider]  vitamin B-12 (CYANOCOBALAMIN) 1000 MCG tablet Take 1,000 mcg by mouth daily.   Yes [provider]  Vitamin D, Cholecalciferol, 1000 units TABS Take 5,000 Units by mouth daily.   Yes [provider]  acetaminophen (TYLENOL) 325 MG tablet Take 2 tablets (650 mg total) by mouth every 6 (six) hours as needed for mild pain (or Fever >/= 101). Patient not taking: Reported on  11/29/2016 01/09/16   Alison Murray, MD  cyclobenzaprine (FLEXERIL) 10 MG tablet Take 1 tablet (10 mg total) by mouth 3 (three) times daily as needed for muscle spasms. 11/29/16   Pricilla Loveless, MD  etodolac (LODINE) 400 MG tablet Take 1 tablet (400 mg total) by mouth 2 (two) times daily. Patient not taking: Reported on 11/29/2016 06/08/16   Sater, Pearletha Furl, MD  TECFIDERA 120 & 240 MG MISC TAKE ONE 120 MG CAPSULE TWICE A DAY FOR 7 DAYS, THEN TAKE ONE 240 MG CAPSULE TWICE A DAY FOR 23 DAYS Patient not taking: Reported on 11/29/2016 06/23/16   Sater, Pearletha Furl, MD  traMADol (ULTRAM) 50 MG tablet Take 2 tablets (100 mg total) by mouth every 4 (four)  hours as needed for moderate pain (or Headache unrelieved by tylenol). Patient not taking: Reported on 11/29/2016 01/09/16   Alison Murray, MD    Family History Family History  Problem Relation Age of Onset  . Hypertension Mother   . Diabetes Mellitus II Mother   . Healthy Father     Social History Social History  Substance Use Topics  . Smoking status: Current Every Day Smoker    Packs/day: 0.50    Years: 8.00    Types: Cigarettes  . Smokeless tobacco: Never Used  . Alcohol use Yes     Comment: occasionally     Allergies   Patient has no known allergies.   Review of Systems Review of Systems  Constitutional: Negative for fever.  Respiratory: Negative for cough and shortness of breath.   Cardiovascular: Negative for chest pain.  Gastrointestinal: Negative for abdominal pain, diarrhea, nausea and vomiting.  Genitourinary: Positive for flank pain and pelvic pain. Negative for dysuria, hematuria, vaginal bleeding and vaginal discharge.  Musculoskeletal: Positive for back pain.  Neurological: Negative for weakness and numbness.  All other systems reviewed and are negative.    Physical Exam Updated Vital Signs BP 121/80   Pulse 71   Temp 98 F (36.7 C) (Oral)   Resp 18   LMP 11/04/2016   SpO2 99%   Physical Exam  Constitutional: She is oriented to person, place, and time. She appears well-developed and well-nourished. No distress.  obese  HENT:  Head: Normocephalic and atraumatic.  Right Ear: External ear normal.  Left Ear: External ear normal.  Nose: Nose normal.  Eyes: Right eye exhibits no discharge. Left eye exhibits no discharge.  Cardiovascular: Normal rate, regular rhythm and normal heart sounds.   Pulmonary/Chest: Effort normal and breath sounds normal.  Abdominal: Soft. She exhibits no distension. There is no tenderness.  Genitourinary:  Genitourinary Comments: Normal external genitalia. No focal tenderness or swelling. I am able to palpate the mons  which is where her pain is and not reproduce her pain  Musculoskeletal:  Mild pain with ROM of hips bilaterally, mild lateral tenderness. No skin changes. However she can do full ROM of hips bilaterally Diffuse bilateral lumbar tenderness Right CVA tenderness  Neurological: She is alert and oriented to person, place, and time.  5/5 strength in BLE. Normal gross sensation  Skin: Skin is warm and dry. She is not diaphoretic.  Nursing note and vitals reviewed.    ED Treatments / Results  Labs (all labs ordered are listed, but only abnormal results are displayed) Labs Reviewed  COMPREHENSIVE METABOLIC PANEL - Abnormal; Notable for the following:       Result Value   Glucose, Bld 107 (*)    Calcium 8.8 (*)  Total Bilirubin 0.2 (*)    All other components within normal limits  CBC WITH DIFFERENTIAL/PLATELET - Abnormal; Notable for the following:    HCT 34.9 (*)    All other components within normal limits  URINALYSIS, ROUTINE W REFLEX MICROSCOPIC - Abnormal; Notable for the following:    APPearance HAZY (*)    Hgb urine dipstick SMALL (*)    Squamous Epithelial / LPF 0-5 (*)    All other components within normal limits  POC URINE PREG, ED    EKG  EKG Interpretation None       Radiology Ct Renal Stone Study  Result Date: 11/29/2016 CLINICAL DATA:  Flank pain.  Stone disease suspected. EXAM: CT ABDOMEN AND PELVIS WITHOUT CONTRAST TECHNIQUE: Multidetector CT imaging of the abdomen and pelvis was performed following the standard protocol without IV contrast. COMPARISON:  04/03/2014 CT abdomen/ pelvis. 11/29/2015 right upper quadrant abdominal sonogram . FINDINGS: Lower chest: No significant pulmonary nodules or acute consolidative airspace disease. Hepatobiliary: Normal liver with no liver mass. Normal gallbladder with no radiopaque cholelithiasis. No biliary ductal dilatation. Pancreas: Normal, with no mass or duct dilation. Spleen: Normal size. No mass. Adrenals/Urinary Tract:  Normal adrenals. No renal stones. No hydronephrosis. No contour deforming renal mass. Normal caliber ureters, with no ureteral stones. Normal bladder. Stomach/Bowel: Grossly normal stomach. Normal caliber small bowel with no small bowel wall thickening. Normal appendix. Normal large bowel with no diverticulosis, large bowel wall thickening or pericolonic fat stranding. Vascular/Lymphatic: Normal caliber abdominal aorta. No pathologically enlarged lymph nodes in the abdomen or pelvis. Reproductive: Symmetrically enlarged ovaries. Right ovary measures 4.7 x 3.5 x 2.8 cm (volume = 24 cm^3). Left ovary measures 4.6 x 2.8 x 3.6 cm (volume = 24 cm^3). No adnexal masses. Grossly normal anteverted uterus. Other: No pneumoperitoneum, ascites or focal fluid collection. Musculoskeletal: No aggressive appearing focal osseous lesions. Mild thoracolumbar spondylosis. IMPRESSION: 1. No acute abnormality.  No urolithiasis.  No hydronephrosis. 2. No evidence of bowel obstruction or acute bowel inflammation. Normal appendix. 3. Symmetrically enlarged ovaries, compatible with the history of polycystic ovarian syndrome (per EPIC). No adnexal masses. Electronically Signed   By: Delbert Phenix M.D.   On: 11/29/2016 18:03    Procedures Procedures (including critical care time)  Medications Ordered in ED Medications  sodium chloride 0.9 % bolus 1,000 mL (0 mLs Intravenous Stopped 11/29/16 1827)  Morphine Sulfate (PF) SOLN 10 mg (10 mg Intravenous Given 11/29/16 1737)     Initial Impression / Assessment and Plan / ED Course  I have reviewed the triage vital signs and the nursing notes.  Pertinent labs & imaging results that were available during my care of the patient were reviewed by me and considered in my medical decision making (see chart for details).     No clear cause of patient's back pain. External genitalia exam benign, no tenderness. No GU symptoms. Back pain probably MSK. Add a muscle relaxer, continue nsaids,  tylenol. No obvious intra-abd pathology. No neuro symptoms. F/u with PCP. Discussed return precautions.   Final Clinical Impressions(s) / ED Diagnoses   Final diagnoses:  Acute bilateral low back pain without sciatica    New Prescriptions Discharge Medication List as of 11/29/2016  7:36 PM       Pricilla Loveless, MD 11/30/16 0003

## 2016-12-01 ENCOUNTER — Telehealth: Payer: Self-pay | Admitting: Neurology

## 2016-12-01 NOTE — Telephone Encounter (Signed)
Patient has clitoris pain, she went to the ED and no etiology found. She went OBGYN Friday. Please call. The pain is radiating to her right side.

## 2016-12-02 MED ORDER — LAMOTRIGINE 100 MG PO TABS: ORAL_TABLET | ORAL | 3 refills | Status: DC

## 2016-12-02 NOTE — Telephone Encounter (Signed)
I have spoken with Aalanah this morning and per RAS, explained that since no other cause for pain can be found, it could possibly be r/t MS. I confirmed that she is taking Gabapentin, and offered Lamictal titration that will hopefully help with pain.  She verbalized understanding of same, is agreeable with Lamictal.  Titration dosing as specified below by RAS reviewed with pt., as well as instructions to stop if she notes a rash. She verbalized understanding of same.  Rx. faxed to [facility] per her request/fim

## 2016-12-02 NOTE — Telephone Encounter (Signed)
I reviewed the ED visits.    Since no etiology was found, pain may or may not be from MS.    She is already on gabapentin.   Lets add lamotrigine 100 mg tablets:  1/2 pill x 5 days, 1/2 pill bid x 5 Days, 1/2 pill tid x 5 days then 1 pill bid.   If she gets a rash, stop immediately.   If not better in a couple weeks, we can see in office.

## 2016-12-02 NOTE — Addendum Note (Signed)
Addended by: Candis Schatz I on: 12/02/2016 11:36 AM   Modules accepted: Orders

## 2016-12-02 NOTE — Telephone Encounter (Signed)
Duplicate/fim 

## 2016-12-10 ENCOUNTER — Ambulatory Visit (INDEPENDENT_AMBULATORY_CARE_PROVIDER_SITE_OTHER): Payer: 59 | Admitting: Neurology

## 2016-12-10 ENCOUNTER — Encounter: Payer: Self-pay | Admitting: Neurology

## 2016-12-10 VITALS — BP 140/91 | HR 85 | Resp 18 | Ht 67.0 in | Wt 225.5 lb

## 2016-12-10 DIAGNOSIS — H469 Unspecified optic neuritis: Secondary | ICD-10-CM

## 2016-12-10 DIAGNOSIS — G35 Multiple sclerosis: Secondary | ICD-10-CM

## 2016-12-10 DIAGNOSIS — G4700 Insomnia, unspecified: Secondary | ICD-10-CM

## 2016-12-10 DIAGNOSIS — R260 Ataxic gait: Secondary | ICD-10-CM

## 2016-12-10 DIAGNOSIS — M7072 Other bursitis of hip, left hip: Secondary | ICD-10-CM

## 2016-12-10 DIAGNOSIS — M7061 Trochanteric bursitis, right hip: Secondary | ICD-10-CM

## 2016-12-10 DIAGNOSIS — M7062 Trochanteric bursitis, left hip: Secondary | ICD-10-CM

## 2016-12-10 DIAGNOSIS — Z6835 Body mass index (BMI) 35.0-35.9, adult: Secondary | ICD-10-CM

## 2016-12-10 DIAGNOSIS — M7071 Other bursitis of hip, right hip: Secondary | ICD-10-CM | POA: Insufficient documentation

## 2016-12-10 MED ORDER — PHENTERMINE HCL 30 MG PO CAPS: 30.0000 mg | ORAL_CAPSULE | ORAL | 5 refills | Status: DC

## 2016-12-10 MED ORDER — CYCLOBENZAPRINE HCL 5 MG PO TABS: 10.0000 mg | ORAL_TABLET | Freq: Three times a day (TID) | ORAL | 1 refills | Status: DC | PRN

## 2016-12-10 MED ORDER — METHYLPREDNISOLONE 4 MG PO TABS: ORAL_TABLET | ORAL | 0 refills | Status: DC

## 2016-12-10 NOTE — Progress Notes (Signed)
[facility]  PATIENT: Taylor Donovan DOB: Oct 29, 1990  REFERRING DOCTOR OR PCP:  Vicenta Aly, FNP SOURCE: patient, notes from ED/hospital stay, lab results, imaging results, MRI images on PACS  _________________________________   HISTORICAL  CHIEF COMPLAINT:  Chief Complaint  Patient presents with  . Multiple Sclerosis    Sts. she continues to tolerate Tecfidera well.  Sts. vaginal pain resolved with Lamictal, but she is now having aching pain bilat hips, lower back/fim    HISTORY OF PRESENT ILLNESS:  Taylor Donovan is a 26 year old woman with MS reporting new left sided visual changes  Groin pain is better since starting lamotrigine.     However, her hips and lower back are hurting more.   Pain fluctuates but is not necessarily worse with changes in position.     LBP does not radiate into the legs.  She is noting some verbal fluency issues and STM seems a little worse.  Attention and focus seem worse.    She is often sleepy and notes fatigue and sometimes takes naps.    She is waking up more at night, sometimes to urinate and sometimes randomly.     After the left optic neuritis in March, vision improved again.   Colors are washed out a bit.     She was having some allergies affecting her eyes and she was advised to take a Benadryl at bedtime.     __________________   She is tolerating Tecfidera well and she denies any exacerbation.     About 10 days ago, she had the onset of left visual blurriness associated with left eye pain.    Pain worseend with moving her eyes.   She had flu-like symptoms and neck stiffness with her IV steroid treatment.    She had similar but milder symptoms the first time she was on IV Steroids.   Etodolac and Flexeril helped her myalgias.     MS:   She did not qualify for a drug study and we have had trouble getting her started on Tecfidera.   She does not feel she could self inject and so can not be on interferon or  glatiramer.  We were finally able to get approval for Tecfidera and she should be getting her first shipment within a week.    Gait/strength/sensation: She feels mildly clumsy with her gait but has no falls. 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  . She denies any significant weakness or numbness in the arms or legs.  Bladder/bowel: She notes urinary frequency with extreme urgency a times and has nocturia twice most nights.  She has rare incontinence when she can't get to bathroom in time (happened once at work).   She notes some constipation.  Fatigue/sleep: She notes fatigue on a daily basis that is worse as the day goes on. She always feels sleepy. She falls asleep easily and will get a couple hours of sleep. However, once she wakes up she has difficulty falling back asleep. This has been present the last year. He did worse while on steroids.   She denies RLS.    Mood/Cognition: She denies much depression or anxiety. She has no major cognitive issues but she has had some problems with verbal fluency. The difficulties with verbal fluency began this year.  MS History:   She presented with left optic neuritis in October 2017.   MRI of the brain showed several other foci, worrisome for multiple  sclerosis. One of the foci is in the right middle cerebellar peduncle and a couple of the foci are periventricular. I personally reviewed the MRI of the brain dated 01/07/2016. There are several T2/FLAIR hyperintense foci, one is in the right middle cerebellar peduncle and a couple are in the periventricular white matter. Other foci are in the subcortical deep white matter. None of the foci enhanced after contrast admission to the hospital.   I also reviewed the MRI of the orbits performed that day. That MRI was normal.   I reviewed the admission and discharge notes from her emergency room visit and hospital stay this month.   TSH was elevated and B12 was  reduced.   ESR, CRP, RPR, Lyme Abs/DNA, ANA were negative.     REVIEW OF SYSTEMS: Constitutional: No fevers, chills, sweats, or change in appetite. She notes fatigue and some sleepiness. Eyes: as above Ear, nose and throat: No hearing loss, ear pain, nasal congestion, sore throat Cardiovascular: No chest pain, palpitations Respiratory: No shortness of breath at rest or with exertion.   No wheezes.   She snores. GastrointestinaI: No nausea, vomiting, diarrhea, abdominal pain, fecal incontinence Genitourinary: No dysuria, urinary retention or frequency.  2 x nocturia. Musculoskeletal: No neck pain, back pain Integumentary: No rash, pruritus, skin lesions Neurological: as above Psychiatric: No depression at this time.  No anxiety Endocrine: No palpitations, diaphoresis, change in appetite, change in weigh or increased thirst Hematologic/Lymphatic: No anemia, purpura, petechiae. Allergic/Immunologic: No itchy/runny eyes, nasal congestion, recent allergic reactions, rashes  ALLERGIES: No Known Allergies  HOME MEDICATIONS:  Current Outpatient Prescriptions:  .  Dimethyl Fumarate 240 MG CPDR, Take 240 mg by mouth 2 (two) times daily., Disp: , Rfl:  .  diphenhydrAMINE (BENADRYL) 25 mg capsule, Take 50 mg by mouth at bedtime as needed for sleep., Disp: , Rfl:  .  etodolac (LODINE) 400 MG tablet, Take 1 tablet (400 mg total) by mouth 2 (two) times daily., Disp: 60 tablet, Rfl: 5 .  gabapentin (NEURONTIN) 300 MG capsule, One or 2 po qHS, Disp: 180 capsule, Rfl: 3 .  lamoTRIgine (LAMICTAL) 100 MG tablet, Take 1/2 tablet daily for 5 days, then take 1/2 tablet twice daily for 5 days, then take 1/2 tablet 3 times daily for 5 days, then take 1 tablet twice daily.  Call if you develop a rash., Disp: 60 tablet, Rfl: 3 .  levonorgestrel-ethinyl estradiol (ORSYTHIA) 0.1-20 MG-MCG tablet, Take 1 tablet by mouth daily., Disp: , Rfl:  .  oxybutynin (DITROPAN) 5 MG tablet, Take 1 tablet (5 mg total) by  mouth 2 (two) times daily., Disp: 180 tablet, Rfl: 3 .  SYNTHROID 125 MCG tablet, TAKE 1 TABLET ON AN EMPTY STOMACH IN THE MORNING ONCE A DAY ORALLY 30 DAY(S), Disp: , Rfl: 5 .  TECFIDERA 120 & 240 MG MISC, TAKE ONE 120 MG CAPSULE TWICE A DAY FOR 7 DAYS, THEN TAKE ONE 240 MG CAPSULE TWICE A DAY FOR 23 DAYS, Disp: 60 each, Rfl: 0 .  vitamin B-12 (CYANOCOBALAMIN) 1000 MCG tablet, Take 1,000 mcg by mouth daily., Disp: , Rfl:  .  Vitamin D, Cholecalciferol, 1000 units TABS, Take 5,000 Units by mouth daily., Disp: , Rfl:  .  cyclobenzaprine (FLEXERIL) 5 MG tablet, Take 2 tablets (10 mg total) by mouth 3 (three) times daily as needed for muscle spasms., Disp: 90 tablet, Rfl: 1 .  methylPREDNISolone (MEDROL) 4 MG tablet, Take over 6 days as directed starting with 6 pills po the first day,  Disp: 21 tablet, Rfl: 0 .  phentermine 30 MG capsule, Take 1 capsule (30 mg total) by mouth every morning., Disp: 30 capsule, Rfl: 5  PAST MEDICAL HISTORY: Past Medical History:  Diagnosis Date  . Acid reflux   . Depression   . Hypothyroid   . Ovarian cyst   . PCOS (polycystic ovarian syndrome)   . Thyroid disease   . Vision abnormalities     PAST SURGICAL HISTORY: No past surgical history on file.  FAMILY HISTORY: Family History  Problem Relation Age of Onset  . Hypertension Mother   . Diabetes Mellitus II Mother   . Healthy Father     SOCIAL HISTORY:  Social History   Social History  . Marital status: Single    Spouse name: N/A  . Number of children: N/A  . Years of education: N/A   Occupational History  . Not on file.   Social History Main Topics  . Smoking status: Current Every Day Smoker    Packs/day: 0.50    Years: 8.00    Types: Cigarettes  . Smokeless tobacco: Never Used  . Alcohol use Yes     Comment: occasionally  . Drug use: No  . Sexual activity: Yes    Birth control/ protection: Pill   Other Topics Concern  . Not on file   Social History Narrative  . No narrative on  file     PHYSICAL EXAM  Vitals:   12/10/16 1558  BP: (!) 140/91  Pulse: 85  Resp: 18  Weight: 225 lb 8 oz (102.3 kg)  Height: 5' 7" (1.702 m)    Body mass index is 35.32 kg/m.   General: The patient is well-developed and well-nourished and in no acute distress.  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   Muscular skeletal:  She is tender over the trochanteric bursae bilaterally and the piriformis muscles and lumbar paraspinal muscles are not significantly tender.   Neurologic Exam  Mental status: The patient is alert and oriented x 3 at the time of the examination. The patient has apparent normal recent and remote memory, with an apparently normal attention span and concentration ability.   Speech is normal.  Cranial nerves: Extraocular movements are full. She has a 2+ left APD.Marland Kitchen Color vision is mildly reduced out of the left eye..  Facial strength and sensation is normal. Trapezius strength is normal. No dysarthria is noted.  The tongue is midline, and the patient has symmetric elevation of the soft palate. No obvious hearing deficits are noted.  Motor:  Muscle bulk is normal.   Tone is normal. Strength is 5/5.Marland Kitchen   Sensory: She reported symmetric touch and vibration sensation in the arms and legs.  Coordination: Cerebellar testing shows good finger-nose-finger and heel-to-shin bilaterally..  Gait and station: Station is normal.   Gait is normal. Tandem gait is mildly wide.. Romberg is negative.   Reflexes: Deep tendon reflexes are symmetric and normal bilaterally.       DIAGNOSTIC DATA (LABS, IMAGING, TESTING) - I reviewed patient records, labs, notes, testing and imaging myself where available.  Lab Results  Component Value Date   WBC 6.7 11/29/2016   HGB 12.1 11/29/2016   HCT 34.9 (L) 11/29/2016   MCV 85.7 11/29/2016   PLT 245 11/29/2016      Component Value Date/Time   NA 137 11/29/2016 1730   K 3.9 11/29/2016 1730   CL 106  11/29/2016 1730   CO2 25 11/29/2016  1730   GLUCOSE 107 (H) 11/29/2016 1730   BUN 14 11/29/2016 1730   CREATININE 0.84 11/29/2016 1730   CALCIUM 8.8 (L) 11/29/2016 1730   PROT 7.0 11/29/2016 1730   ALBUMIN 4.2 11/29/2016 1730   AST 16 11/29/2016 1730   ALT 14 11/29/2016 1730   ALKPHOS 59 11/29/2016 1730   BILITOT 0.2 (L) 11/29/2016 1730   GFRNONAA >60 11/29/2016 1730   GFRAA >60 11/29/2016 1730   No results found for: CHOL, HDL, LDLCALC, LDLDIRECT, TRIG, CHOLHDL No results found for: HGBA1C Lab Results  Component Value Date   VITAMINB12 153 (L) 01/08/2016   Lab Results  Component Value Date   TSH 6.421 (H) 01/08/2016       ASSESSMENT AND PLAN  Multiple sclerosis ([place]) - Plan: CANCELED: CBC with Differential/Platelet, CANCELED: Hepatic function panel  Optic neuritis  Ataxic gait  Insomnia, unspecified type  Trochanteric bursitis of both hips  BMI 35.0-35.9,adult    1.   Continue Tecfidera 240 mg twice a day. She has recent blood work. White count was fine.  2.     Oxybutynin 5 mg twice a day for her bladder and gabapentin 300-600 mg nightly for sleep maintenance insomnia and lamotrigine for dysesthesias. 3.    Phentermine 30 mg daily for obesity and reduced attention and focus and fatigue  Return in 5-6 months or sooner if there are new or worsening neurologic symptoms.   Richard A. Felecia Shelling, MD, PhD 7/67/2094, 7:09 PM Certified in Neurology, Clinical Neurophysiology, Sleep Medicine, Pain Medicine and Neuroimaging  [facility] [address], [place], [place] [address] [phone]

## 2017-01-12 ENCOUNTER — Other Ambulatory Visit: Payer: Self-pay | Admitting: Neurology

## 2017-02-14 ENCOUNTER — Other Ambulatory Visit: Payer: Self-pay | Admitting: Neurology

## 2017-02-19 ENCOUNTER — Other Ambulatory Visit: Payer: Self-pay | Admitting: Neurology

## 2017-02-27 ENCOUNTER — Telehealth: Payer: Self-pay | Admitting: Neurology

## 2017-02-27 NOTE — Telephone Encounter (Signed)
Patient called on-call physician complains of new onset left low back pain radiating pain to left lower extremity,  Already taking gabapentin 300 mg 1-2 tablets every night, also have prescription for Flexeril, NSAIDs,  I have advised Taylor Donovan taking higher dose of gabapentin, 2 tablets at nighttime, may take up to 3 times during the day, also may combine with NSAIDs, Flexeril, but do have potential of increased side effect of sleepiness with combination of medications,

## 2017-03-10 ENCOUNTER — Other Ambulatory Visit: Payer: Self-pay | Admitting: Neurology

## 2017-03-14 ENCOUNTER — Other Ambulatory Visit: Payer: Self-pay | Admitting: Neurology

## 2017-03-15 ENCOUNTER — Emergency Department (HOSPITAL_COMMUNITY)
Admission: EM | Admit: 2017-03-15 | Discharge: 2017-03-15 | Disposition: A | Discharge status: Home / Self Care | Payer: 59 | Attending: Emergency Medicine | Admitting: Emergency Medicine

## 2017-03-15 ENCOUNTER — Encounter (HOSPITAL_COMMUNITY): Payer: Self-pay | Admitting: Emergency Medicine

## 2017-03-15 DIAGNOSIS — Z79899 Other long term (current) drug therapy: Secondary | ICD-10-CM | POA: Diagnosis not present

## 2017-03-15 DIAGNOSIS — K644 Residual hemorrhoidal skin tags: Secondary | ICD-10-CM | POA: Diagnosis not present

## 2017-03-15 DIAGNOSIS — K921 Melena: Secondary | ICD-10-CM | POA: Diagnosis not present

## 2017-03-15 DIAGNOSIS — E039 Hypothyroidism, unspecified: Secondary | ICD-10-CM | POA: Diagnosis not present

## 2017-03-15 DIAGNOSIS — K648 Other hemorrhoids: Secondary | ICD-10-CM

## 2017-03-15 DIAGNOSIS — F17210 Nicotine dependence, cigarettes, uncomplicated: Secondary | ICD-10-CM | POA: Insufficient documentation

## 2017-03-15 LAB — CBC
HEMATOCRIT: 36.7 % (ref 36.0–46.0)
HEMOGLOBIN: 12.4 g/dL (ref 12.0–15.0)
MCH: 29.7 pg (ref 26.0–34.0)
MCHC: 33.8 g/dL (ref 30.0–36.0)
MCV: 87.8 fL (ref 78.0–100.0)
Platelets: 238 10*3/uL (ref 150–400)
RBC: 4.18 MIL/uL (ref 3.87–5.11)
RDW: 11.9 % (ref 11.5–15.5)
WBC: 6.5 10*3/uL (ref 4.0–10.5)

## 2017-03-15 LAB — COMPREHENSIVE METABOLIC PANEL
ALBUMIN: 4.2 g/dL (ref 3.5–5.0)
ALT: 15 U/L (ref 14–54)
ANION GAP: 4 — AB (ref 5–15)
AST: 15 U/L (ref 15–41)
Alkaline Phosphatase: 60 U/L (ref 38–126)
BUN: 10 mg/dL (ref 6–20)
CHLORIDE: 107 mmol/L (ref 101–111)
CO2: 27 mmol/L (ref 22–32)
Calcium: 8.9 mg/dL (ref 8.9–10.3)
Creatinine, Ser: 0.67 mg/dL (ref 0.44–1.00)
GFR calc Af Amer: >60 mL/min (ref >60)
GFR calc non Af Amer: >60 mL/min (ref >60)
Glucose, Bld: 79 mg/dL (ref 65–99)
POTASSIUM: 4 mmol/L (ref 3.5–5.1)
Sodium: 138 mmol/L (ref 135–145)
Total Bilirubin: 0.5 mg/dL (ref 0.3–1.2)
Total Protein: 7.1 g/dL (ref 6.5–8.1)

## 2017-03-15 LAB — TYPE AND SCREEN
ABO/RH(D): O NEG
ANTIBODY SCREEN: NEGATIVE

## 2017-03-15 LAB — I-STAT BETA HCG BLOOD, ED (MC, WL, AP ONLY): I-stat hCG, quantitative: <5 m[IU]/mL (ref <5)

## 2017-03-15 NOTE — Discharge Instructions (Signed)
Increase hydration and follow a high fiber diet. Use a stool softener such as Colace, DulcoLax, or MiraLax to help soften stool and relieve pain with bowel movements. Sitz baths (ask pharmacist if you need help finding this) and Epsom salt baths will also help improve symptoms. Apply topical medications to hemorrhoid twice daily. If symptoms persist longer than 1-2 weeks, please follow up with your primary care provider. Please return to ER for new or worsening symptoms, any additional concerns.  ° °

## 2017-03-15 NOTE — ED Triage Notes (Signed)
patient reports has PMH hemorrhoids and and when had BM yesterday noticed bright red blood.  Patient reports today she had bright red blood again when wiped and in panties.

## 2017-03-15 NOTE — ED Provider Notes (Signed)
[facility]-EMERGENCY DEPT Provider Note  CSN: [id number] Arrival date & time: 03/15/17 1819  Chief Complaint(s) Hemorrhoids and Rectal Bleeding  HPI Taylor Donovan is a 26 y.o. female presents to the emergency department with 2 days of hematochezia with bowel movements.  This is new onset for the patient.  Blood is bright red.  She denies any difficulty with bowel movements or constipation.  Denies any recent antibiotics, recent travel, suspicious food intake, abdominal pain, nausea, vomiting, diarrhea, fevers, chills.  She denies any vaginal bleeding.  She does endorse some pain with bowel movement.  No other alleviating or aggravating factors.  Denies any other physical complaints.  HPI  Past Medical History Past Medical History:  Diagnosis Date  . Acid reflux   . Depression   . Hypothyroid   . Ovarian cyst   . PCOS (polycystic ovarian syndrome)   . Thyroid disease   . Vision abnormalities    Patient Active Problem List   Diagnosis Date Noted  . Bilateral hip bursitis 12/10/2016  . BMI 35.0-35.9,adult 12/10/2016  . Insomnia 06/10/2016  . Urinary urgency 06/10/2016  . Multiple sclerosis ([facility]) 01/29/2016  . 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 Vision loss of left eye 01/08/2016  . Abnormal brain MRI 01/08/2016  . Hypothyroidism 01/08/2016  . Vision loss, left eye 01/08/2016  . HASHIMOTO'S THYROIDITIS 08/13/2009  . DYSPNEA ON EXERTION 08/13/2009   Home Medication(s) Prior to Admission medications   Medication Sig Start Date End Date Taking? Authorizing Provider  amoxicillin (AMOXIL) 500 MG capsule Take 500 mg by mouth 3 (three) times daily. 03/12/17  Yes [provider]  cyclobenzaprine (FLEXERIL) 5 MG tablet TAKE 2 TABLETS BY MOUTH 3 TIMES A DAY AS NEEDED FOR MUSCLE SPASMS 02/16/17  Yes Sater, Pearletha Furl, MD  diphenhydrAMINE (BENADRYL) 25 mg capsule Take 50 mg by mouth at bedtime as needed for sleep.   Yes [provider]  etodolac (LODINE) 400 MG tablet Take 1 tablet (400 mg total) by mouth 2 (two) times daily. 06/08/16  Yes Sater, Pearletha Furl, MD  gabapentin (NEURONTIN) 300 MG capsule One or 2 po qHS Patient taking differently: Take 900 mg by mouth at bedtime.  08/12/16  Yes Sater, Pearletha Furl, MD  lamoTRIgine (LAMICTAL) 100 MG tablet 1/2 TAB DAILY X 5DAYS, 1/2 TAB TWICE A DAY X 5DAYS, 1/2 TAB 3X A DAY 5DAYS, THEN 1 TAB TWICE DAILY Patient taking differently: Take 100 mg by mouth 2 (two) times daily.  03/10/17  Yes Sater, Pearletha Furl, MD  levonorgestrel-ethinyl estradiol (ORSYTHIA) 0.1-20 MG-MCG tablet Take 1 tablet by mouth daily.   Yes [provider]  oxybutynin (DITROPAN) 5 MG tablet Take 1 tablet (5 mg total) by mouth 2 (two) times daily. 08/12/16  Yes Sater, Pearletha Furl, MD  phentermine 30 MG capsule Take 1 capsule (30 mg total) by mouth every morning. 12/10/16  Yes Sater, Pearletha Furl, MD  SYNTHROID 125 MCG tablet TAKE 1 TABLET ON AN EMPTY STOMACH IN THE MORNING ONCE A DAY ORALLY 30 DAY(S) 05/10/16  Yes [provider]  TECFIDERA 240 MG CPDR TAKE 240 MG (1 CAPSULE) BY MOUTH TWICE A DAY 02/23/17  Yes Sater, Pearletha Furl, MD  methylPREDNISolone (MEDROL) 4 MG tablet Take over 6 days as directed starting with 6 pills po the first day Patient not taking: Reported on 03/15/2017 12/10/16   Asa Lente, MD  Past Surgical History History reviewed. No pertinent surgical history. Family History Family History  Problem Relation Age of Onset  . Hypertension Mother   . Diabetes Mellitus II Mother   . Healthy Father     Social History Social History   Tobacco Use  . Smoking status: Current Every Day Smoker    Packs/day: 0.50    Years: 8.00    Pack years: 4.00    Types: Cigarettes  . Smokeless tobacco: Never Used  Substance Use Topics  . Alcohol use: Yes     Comment: occasionally  . Drug use: No   Allergies Patient has no known allergies.  Review of Systems Review of Systems All other systems are reviewed and are negative for acute change except as noted in the HPI  Physical Exam Vital Signs  I have reviewed the triage vital signs BP (!) 143/96 (BP Location: Left Arm)   Pulse 92   Temp 98.3 F (36.8 C) (Oral)   Resp 18   Ht 5\' 7"  (1.702 m)   Wt 92.5 kg (204 lb)   LMP 03/01/2017   SpO2 100%   BMI 31.95 kg/m   Physical Exam  Constitutional: She is oriented to person, place, and time. She appears well-developed and well-nourished. No distress.  HENT:  Head: Normocephalic and atraumatic.  Right Ear: External ear normal.  Left Ear: External ear normal.  Nose: Nose normal.  Eyes: Conjunctivae and EOM are normal. No scleral icterus.  Neck: Normal range of motion and phonation normal.  Cardiovascular: Normal rate and regular rhythm.  Pulmonary/Chest: Effort normal. No stridor. No respiratory distress.  Abdominal: She exhibits no distension. There is no tenderness. There is no rigidity, no rebound and no guarding.  Genitourinary: Rectal exam shows external hemorrhoid (at 3 o'clock, nonthrombosed) and internal hemorrhoid ( at 11 -12 o'clock, no active bleeding). Pelvic exam was performed with patient in the knee-chest position.  Genitourinary Comments: No bleeding noted above the hemorrhoid. Stool brown.  Musculoskeletal: Normal range of motion. She exhibits no edema.  Neurological: She is alert and oriented to person, place, and time.  Skin: She is not diaphoretic.  Psychiatric: She has a normal mood and affect. Her behavior is normal.  Vitals reviewed.   ED Results and Treatments Labs (all labs ordered are listed, but only abnormal results are displayed) Labs Reviewed  COMPREHENSIVE METABOLIC PANEL - Abnormal; Notable for the following components:      Result Value   Anion gap 4 (*)    All other components within normal  limits  CBC  I-STAT BETA HCG BLOOD, ED ([facility], [facility], [facility] ONLY)  POC OCCULT BLOOD, ED  TYPE AND SCREEN  ABO/RH                                                                                                                         EKG  EKG Interpretation  Date/Time:    Ventricular Rate:    PR Interval:    QRS Duration:  QT Interval:    QTC Calculation:   R Axis:     Text Interpretation:        Radiology No results found. Pertinent labs & imaging results that were available during my care of the patient were reviewed by me and considered in my medical decision making (see chart for details).  Medications Ordered in ED Medications - No data to display                                                                                                                                  Procedures LOWER ENDOSCOPY Date/Time: 03/15/2017 10:49 PM Performed by: Nira Connardama, Pedro Eduardo, MD Authorized by: Nira Connardama, Pedro Eduardo, MD  Consent: Verbal consent obtained. Consent given by: patient Patient understanding: patient states understanding of the procedure being performed Patient consent: the patient's understanding of the procedure matches consent given Patient identity confirmed: verbally with patient Time out: Immediately prior to procedure a "time out" was called to verify the correct patient, procedure, equipment, support staff and site/side marked as required. Indications: hemorrhoids and rectal bleeding  Sedation: Patient sedated: no  Scope type: anoscope External exam performed: yes Positive external exam findings: external hemorrhoids External hemorrhoid position: three o'clock Digital exam performed: yes Negative digital exam findings: no laxity of anal sphincter Positive internal exam findings: internal hemorrhoid Internal hemorrhoid position: eleven o'clock and twelve o'clock Internal hemorrhoid prolapsed: no Procedure termination: procedure complete Patient tolerance:  Patient tolerated the procedure well with no immediate complications      (including critical care time)  Medical Decision Making / ED Course I have reviewed the nursing notes for this encounter and the patient's prior records (if available in EHR or on provided paperwork).    Patient here with hematochezia.  On exam she is got nonthrombosed external hemorrhoid and internal hemorrhoid that is not actively bleeding at this time.  No blood above the hemorrhoids.  Stool brown.  Abdomen benign.  Hemoglobin stable.  Recommended stool softeners and follow-up with PCP as needed.  The patient appears reasonably screened and/or stabilized for discharge and I doubt any other medical condition or other [facility] requiring further screening, evaluation, or treatment in the ED at this time prior to discharge.  The patient is safe for discharge with strict return precautions.   Final Clinical Impression(s) / ED Diagnoses Final diagnoses:  External hemorrhoid  Internal hemorrhoids  Hematochezia    Disposition: Discharge  Condition: Good  I have discussed the results, Dx and Tx plan with the patient who expressed understanding and agree(s) with the plan. Discharge instructions discussed at great length. The patient was given strict return precautions who verbalized understanding of the instructions. No further questions at time of discharge.    ED Discharge Orders    None       Follow Up: Elizabeth PalauAnderson, Teresa, FNP [address] [place] [id number] [phone]  Schedule an appointment as soon as  possible for a visit  As needed     This chart was dictated using voice recognition software.  Despite best efforts to proofread,  errors can occur which can change the documentation meaning.   Nira Conn, MD 03/15/17 2251

## 2017-03-16 LAB — ABO/RH: ABO/RH(D): O NEG

## 2017-04-13 ENCOUNTER — Telehealth: Payer: Self-pay | Admitting: Neurology

## 2017-04-13 NOTE — Telephone Encounter (Signed)
Extended conversation with pt. was calm, pleasant.  She is appropriate, reasonable. Sts. has been dealing with more depression, anxiety, mood swings for several weeks.  Denies SI/HI.  Was on an antidepressant yrs. ago but can't remember the name of it. NKDA. Will speak with RAS and call her back/fim

## 2017-04-13 NOTE — Telephone Encounter (Signed)
Pt called to inform us that she has been feeling really down lately. Pt is still able to get up and do what needs to be done but just hasn't felt up to do anything. Please call to discuss

## 2017-04-14 MED ORDER — ESCITALOPRAM OXALATE 20 MG PO TABS: 20.0000 mg | ORAL_TABLET | Freq: Every day | ORAL | 1 refills | Status: DC

## 2017-04-14 NOTE — Telephone Encounter (Signed)
Spoke with Oretha Caprice and per RAS, offered Lexapro 20mg  po qd. She is agreeable, and aware it will take about 6 wks. to realize benefit of med. Rx. escribed to [facility]./fim

## 2017-04-14 NOTE — Addendum Note (Signed)
Addended by: Candis Schatz I on: 04/14/2017 08:33 AM   Modules accepted: Orders

## 2017-05-27 NOTE — Telephone Encounter (Signed)
Error

## 2017-06-08 ENCOUNTER — Telehealth: Payer: Self-pay | Admitting: Neurology

## 2017-06-08 NOTE — Telephone Encounter (Signed)
Jared from [employer] called stating pt's PA ran out on or around the 9th of March and pt is in need of her TECFIDERA 240 MG CPDR.  Jared's call back # is [phone] xt [phone] or option 2 for anyone else available in his dept.

## 2017-06-08 NOTE — Telephone Encounter (Signed)
Spoke with Lelon Mast at [facility].  Will call [facility], phone# [phone]. to complete a PA for pt's Tecfidera/fim

## 2017-06-09 NOTE — Telephone Encounter (Signed)
PA for Tecfidera 240mg  capsules #180/90 completed via phone with [facility], phone# [phone].  Dx: RRMS (G35).  No tried and faileds, although she is unable to self-inject, so Avonex, Plegridy, Betaseron, Rebif are contraindicated for her. She has been stable on Tecfidera since April 2018.  PA approved for dates 05/10/17 thru 06/09/18.  Case ID: [id number]./[id number]

## 2017-06-10 ENCOUNTER — Other Ambulatory Visit: Payer: Self-pay

## 2017-06-10 ENCOUNTER — Ambulatory Visit (INDEPENDENT_AMBULATORY_CARE_PROVIDER_SITE_OTHER): Payer: 59 | Admitting: Neurology

## 2017-06-10 ENCOUNTER — Encounter: Payer: Self-pay | Admitting: Neurology

## 2017-06-10 VITALS — BP 152/91 | HR 85 | Resp 18 | Ht 67.0 in | Wt 208.5 lb

## 2017-06-10 DIAGNOSIS — R5383 Other fatigue: Secondary | ICD-10-CM | POA: Diagnosis not present

## 2017-06-10 DIAGNOSIS — R260 Ataxic gait: Secondary | ICD-10-CM

## 2017-06-10 DIAGNOSIS — F329 Major depressive disorder, single episode, unspecified: Secondary | ICD-10-CM

## 2017-06-10 DIAGNOSIS — G35 Multiple sclerosis: Secondary | ICD-10-CM

## 2017-06-10 DIAGNOSIS — R3915 Urgency of urination: Secondary | ICD-10-CM

## 2017-06-10 DIAGNOSIS — F32A Depression, unspecified: Secondary | ICD-10-CM

## 2017-06-10 MED ORDER — PHENTERMINE HCL 37.5 MG PO CAPS: 37.5000 mg | ORAL_CAPSULE | ORAL | 1 refills | Status: DC

## 2017-06-10 MED ORDER — GABAPENTIN 300 MG PO CAPS: 900.0000 mg | ORAL_CAPSULE | Freq: Every day | ORAL | 3 refills | Status: DC

## 2017-06-10 MED ORDER — BACLOFEN 10 MG PO TABS: 10.0000 mg | ORAL_TABLET | Freq: Three times a day (TID) | ORAL | 3 refills | Status: DC

## 2017-06-10 MED ORDER — OXYBUTYNIN CHLORIDE 5 MG PO TABS: 5.0000 mg | ORAL_TABLET | Freq: Two times a day (BID) | ORAL | 3 refills | Status: DC

## 2017-06-10 NOTE — Progress Notes (Signed)
[facility]  PATIENT: Taylor Donovan DOB: Oct 04, 1990  REFERRING DOCTOR OR PCP:  Vicenta Aly, FNP SOURCE: patient, notes from ED/hospital stay, lab results, imaging results, MRI images on PACS  _________________________________   HISTORICAL  CHIEF COMPLAINT:  Chief Complaint  Patient presents with  . Multiple Sclerosis    Sts. she continues to tolerate Tecfidera well.  She has a very physical job--is on her feet all day, carries heavy food trays.  Is having more leg pain--muscular and joint pian--and more fatigue.  Is out of Flexeril, and stopped Phentermine although she doesn't remember why/fim    HISTORY OF PRESENT ILLNESS:  Taylor Donovan is a 27 year old woman with MS reporting new left sided visual changes  Update 06/10/2017: She feels her MS is stable,     She is on Tecfidera and she tolerates it well.   She denies exacerbations.    No abdominal symptoms or flushing.     She has more tenderness in her legs, right > left.   She notes some stiffness/spasticity.  Pain is achy.   She notes it more when she is not using.   It sometimes bothers her at night.    Flexeril at night did not help much.   She has not tried baclofen.    She is walking well and denies weakness.    She was having groin numbness and pain, helped by lamotrigine.    She started doing better and stopped the lamotrigine.   She is still on gabapentin.    Bladder is doing well on oxybutynin.         She has fatigue. She sometimes works 11-12 hours.    At first phentermine was helping but she felt it helped less after a few months.   Mood is doing better on escitalopram.     From 12/10/2016: Groin pain is better since starting lamotrigine.     However, her hips and lower back are hurting more.   Pain fluctuates but is not necessarily worse with changes in position.     LBP does not radiate into the legs.  She is noting some verbal fluency issues and STM seems a little worse.  Attention and  focus seem worse.    She is often sleepy and notes fatigue and sometimes takes naps.    She is waking up more at night, sometimes to urinate and sometimes randomly.     After the left optic neuritis in March, vision improved again.   Colors are washed out a bit.     She was having some allergies affecting her eyes and she was advised to take a Benadryl at bedtime.     __________________   She is tolerating Tecfidera well and she denies any exacerbation.     About 10 days ago, she had the onset of left visual blurriness associated with left eye pain.    Pain worseend with moving her eyes.   She had flu-like symptoms and neck stiffness with her IV steroid treatment.    She had similar but milder symptoms the first time she was on IV Steroids.   Etodolac and Flexeril helped her myalgias.     MS:   She did not qualify for a drug study and we have had trouble getting her started on Tecfidera.   She does not feel she could self inject and so can not be on interferon or glatiramer.  We were finally able to get approval for Tecfidera and she should be  getting her first shipment within a week.    Gait/strength/sensation: She feels mildly clumsy with her gait but has no falls. 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  . She denies any significant weakness or numbness in the arms or legs.  Bladder/bowel: She notes urinary frequency with extreme urgency a times and has nocturia twice most nights.  She has rare incontinence when she can't get to bathroom in time (happened once at work).   She notes some constipation.  Fatigue/sleep: She notes fatigue on a daily basis that is worse as the day goes on. She always feels sleepy. She falls asleep easily and will get a couple hours of sleep. However, once she wakes up she has difficulty falling back asleep. This has been present the last year. He did worse while on steroids.   She denies RLS.     Mood/Cognition: She denies much depression or anxiety. She has no major cognitive issues but she has had some problems with verbal fluency. The difficulties with verbal fluency began this year.  MS History:   She presented with left optic neuritis in October 2017.   MRI of the brain showed several other foci, worrisome for multiple sclerosis. One of the foci is in the right middle cerebellar peduncle and a couple of the foci are periventricular. I personally reviewed the MRI of the brain dated 01/07/2016. There are several T2/FLAIR hyperintense foci, one is in the right middle cerebellar peduncle and a couple are in the periventricular white matter. Other foci are in the subcortical deep white matter. None of the foci enhanced after contrast admission to the hospital.   I also reviewed the MRI of the orbits performed that day. That MRI was normal.   I reviewed the admission and discharge notes from her emergency room visit and hospital stay this month.   TSH was elevated and B12 was reduced.   ESR, CRP, RPR, Lyme Abs/DNA, ANA were negative.     REVIEW OF SYSTEMS: Constitutional: No fevers, chills, sweats, or change in appetite. She notes fatigue and some sleepiness. Eyes: as above Ear, nose and throat: No hearing loss, ear pain, nasal congestion, sore throat Cardiovascular: No chest pain, palpitations Respiratory: No shortness of breath at rest or with exertion.   No wheezes.   She snores. GastrointestinaI: No nausea, vomiting, diarrhea, abdominal pain, fecal incontinence Genitourinary: No dysuria, urinary retention or frequency.  2 x nocturia. Musculoskeletal: No neck pain, back pain Integumentary: No rash, pruritus, skin lesions Neurological: as above Psychiatric: Notes some depression better on Lexapro.  No anxiety Endocrine: No palpitations, diaphoresis, change in appetite, change in weigh or increased thirst Hematologic/Lymphatic: No anemia, purpura, petechiae. Allergic/Immunologic: No  itchy/runny eyes, nasal congestion, recent allergic reactions, rashes  ALLERGIES: No Known Allergies  HOME MEDICATIONS:  Current Outpatient Medications:  .  diphenhydrAMINE (BENADRYL) 25 mg capsule, Take 50 mg by mouth at bedtime as needed for sleep., Disp: , Rfl:  .  escitalopram (LEXAPRO) 20 MG tablet, Take 1 tablet (20 mg total) by mouth daily., Disp: 90 tablet, Rfl: 1 .  gabapentin (NEURONTIN) 300 MG capsule, Take 3 capsules (900 mg total) by mouth at bedtime., Disp: 270 capsule, Rfl: 3 .  levonorgestrel-ethinyl estradiol (ORSYTHIA) 0.1-20 MG-MCG tablet, Take 1 tablet by mouth daily., Disp: , Rfl:  .  oxybutynin (DITROPAN) 5 MG tablet, Take 1 tablet (5 mg total) by mouth 2 (two) times daily., Disp: 180 tablet, Rfl: 3 .  SYNTHROID 125 MCG tablet, TAKE 1 TABLET ON AN EMPTY STOMACH IN THE MORNING ONCE A DAY ORALLY 30 DAY(S), Disp: , Rfl: 5 .  TECFIDERA 240 MG CPDR, TAKE 240 MG (1 CAPSULE) BY MOUTH TWICE A DAY, Disp: 180 capsule, Rfl: 3 .  amoxicillin (AMOXIL) 500 MG capsule, Take 500 mg by mouth 3 (three) times daily., Disp: , Rfl: 0 .  baclofen (LIORESAL) 10 MG tablet, Take 1 tablet (10 mg total) by mouth 3 (three) times daily., Disp: 270 each, Rfl: 3 .  etodolac (LODINE) 400 MG tablet, Take 1 tablet (400 mg total) by mouth 2 (two) times daily. (Patient not taking: Reported on 06/10/2017), Disp: 60 tablet, Rfl: 5 .  phentermine 37.5 MG capsule, Take 1 capsule (37.5 mg total) by mouth every morning., Disp: 90 capsule, Rfl: 1  PAST MEDICAL HISTORY: Past Medical History:  Diagnosis Date  . Acid reflux   . Depression   . Hypothyroid   . Ovarian cyst   . PCOS (polycystic ovarian syndrome)   . Thyroid disease   . Vision abnormalities     PAST SURGICAL HISTORY: History reviewed. No pertinent surgical history.  FAMILY HISTORY: Family History  Problem Relation Age of Onset  . Hypertension Mother   . Diabetes Mellitus II Mother   . Healthy Father     SOCIAL HISTORY:  Social  History   Socioeconomic History  . Marital status: Single    Spouse name: Not on file  . Number of children: Not on file  . Years of education: Not on file  . Highest education level: Not on file  Social Needs  . Financial resource strain: Not on file  . Food insecurity - worry: Not on file  . Food insecurity - inability: Not on file  . Transportation needs - medical: Not on file  . Transportation needs - non-medical: Not on file  Occupational History  . Not on file  Tobacco Use  . Smoking status: Current Every Day Smoker    Packs/day: 0.50    Years: 8.00    Pack years: 4.00    Types: Cigarettes  . Smokeless tobacco: Never Used  Substance and Sexual Activity  . Alcohol use: Yes    Comment: occasionally  . Drug use: No  . Sexual activity: Yes    Birth control/protection: Pill  Other Topics Concern  . Not on file  Social History Narrative  . Not on file     PHYSICAL EXAM  Vitals:   06/10/17 1605  BP: (!) 152/91  Pulse: 85  Resp: 18  Weight: 208 lb 8 oz (94.6 kg)  Height: '5\' 7"'$  (1.702 m)    Body mass index is 32.66 kg/m.   General: The patient is well-developed and well-nourished and in no acute distress.  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   Muscular skeletal:  She is tender over the trochanteric bursae bilaterally and the piriformis muscles and lumbar paraspinal muscles are not significantly tender.   Neurologic Exam  Mental status: The patient is alert and oriented x 3 at the time of the examination. The patient has apparent normal recent and remote memory, with an apparently normal attention span and concentration ability.   Speech is normal.  Cranial nerves: Extraocular movements are full. She has a 2+ left APD.Marland Kitchen Color vision is mildly reduced out of the left eye..  Facial strength and sensation is normal. Trapezius strength is normal. No dysarthria is noted.  The tongue is  midline, and the patient has symmetric elevation  of the soft palate. No obvious hearing deficits are noted.  Motor:  Muscle bulk is normal.   Tone is normal. Strength is 5/5.Marland Kitchen   Sensory: She reported symmetric touch and vibration sensation in the arms and legs.  Coordination: Cerebellar testing shows good finger-nose-finger and heel-to-shin bilaterally..  Gait and station: Station is normal.   Gait is normal. Tandem gait is mildly wide.. Romberg is negative.   Reflexes: Deep tendon reflexes are symmetric and normal bilaterally.       DIAGNOSTIC DATA (LABS, IMAGING, TESTING) - I reviewed patient records, labs, notes, testing and imaging myself where available.  Lab Results  Component Value Date   WBC 6.5 03/15/2017   HGB 12.4 03/15/2017   HCT 36.7 03/15/2017   MCV 87.8 03/15/2017   PLT 238 03/15/2017      Component Value Date/Time   NA 138 03/15/2017 1942   K 4.0 03/15/2017 1942   CL 107 03/15/2017 1942   CO2 27 03/15/2017 1942   GLUCOSE 79 03/15/2017 1942   BUN 10 03/15/2017 1942   CREATININE 0.67 03/15/2017 1942   CALCIUM 8.9 03/15/2017 1942   PROT 7.1 03/15/2017 1942   ALBUMIN 4.2 03/15/2017 1942   AST 15 03/15/2017 1942   ALT 15 03/15/2017 1942   ALKPHOS 60 03/15/2017 1942   BILITOT 0.5 03/15/2017 1942   GFRNONAA >60 03/15/2017 1942   GFRAA >60 03/15/2017 1942   No results found for: CHOL, HDL, LDLCALC, LDLDIRECT, TRIG, CHOLHDL      ASSESSMENT AND PLAN  Multiple sclerosis ([place]) - Plan: CBC with Differential/Platelet, MR BRAIN W WO CONTRAST  Ataxic gait  Urinary urgency  Other fatigue  Depression, unspecified depression type    1.   Continue Tecfidera 240 mg twice a day. Check CBC and LFT 2.    Continue Oxybutynin 5 mg twice a day for her bladder and gabapentin 300-600 mg nightly for sleep maintenance insomnia and dysesthesias 3.    Phentermine 37.5 mg daily for obesity and reduced attention and focus and fatigue  4.   Baclofen for spasms/discomfort Return in 5-6 months or sooner if there are  new or worsening neurologic symptoms.   Armani Brar A. Felecia Shelling, MD, PhD 06/19/252, 2:70 PM Certified in Neurology, Clinical Neurophysiology, Sleep Medicine, Pain Medicine and Neuroimaging  [facility] [address], [place], [place] [address] [phone]

## 2017-06-11 ENCOUNTER — Telehealth: Payer: Self-pay | Admitting: *Deleted

## 2017-06-11 ENCOUNTER — Telehealth: Payer: Self-pay | Admitting: Neurology

## 2017-06-11 LAB — CBC WITH DIFFERENTIAL/PLATELET
BASOS: 0 %
Basophils Absolute: 0 10*3/uL (ref 0.0–0.2)
EOS (ABSOLUTE): 0.1 10*3/uL (ref 0.0–0.4)
Eos: 2 %
HEMOGLOBIN: 12.8 g/dL (ref 11.1–15.9)
Hematocrit: 37.2 % (ref 34.0–46.6)
IMMATURE GRANS (ABS): 0 10*3/uL (ref 0.0–0.1)
Immature Granulocytes: 0 %
LYMPHS: 18 %
Lymphocytes Absolute: 1.1 10*3/uL (ref 0.7–3.1)
MCH: 30 pg (ref 26.6–33.0)
MCHC: 34.4 g/dL (ref 31.5–35.7)
MCV: 87 fL (ref 79–97)
Monocytes Absolute: 0.4 10*3/uL (ref 0.1–0.9)
Monocytes: 7 %
NEUTROS ABS: 4.4 10*3/uL (ref 1.4–7.0)
Neutrophils: 73 %
PLATELETS: 272 10*3/uL (ref 150–379)
RBC: 4.27 x10E6/uL (ref 3.77–5.28)
RDW: 12.9 % (ref 12.3–15.4)
WBC: 6 10*3/uL (ref 3.4–10.8)

## 2017-06-11 NOTE — Telephone Encounter (Signed)
Taylor Donovan /medicaid order sent to [facility] they will contact the patient to schedule. And they obtain the auth.

## 2017-06-11 NOTE — Telephone Encounter (Signed)
-----   Message from Richard A Sater, MD sent at 06/11/2017 10:44 AM EDT ----- Please let the patient know that the lab work is fine.  

## 2017-06-11 NOTE — Telephone Encounter (Signed)
LMOM with below lab results.  She does not need to return this call unless she has questions/fim 

## 2017-07-02 IMAGING — US US ART/VEN ABD/PELV/SCROTUM DOPPLER LTD
1 series · 13 of 25 positions shown · non-contrast
Comparison: Pelvic ultrasound 04/29/2015.

CLINICAL DATA: 24-year-old female with history of right lower
quadrant abdominal pain for the past 3 days, severe in intensity
today. Prior history of ovarian cyst.

EXAM:
TRANSABDOMINAL AND TRANSVAGINAL ULTRASOUND OF PELVIS
DOPPLER ULTRASOUND OF OVARIES
TECHNIQUE: Both transabdominal and transvaginal ultrasound examinations of the
pelvis were performed. Transabdominal technique was performed for
global imaging of the pelvis including uterus, ovaries, adnexal
regions, and pelvic cul-de-sac.
It was necessary to proceed with endovaginal exam following the
transabdominal exam to visualize the ovaries bilaterally. Color and
duplex Doppler ultrasound was utilized to evaluate blood flow to the
ovaries.

[Series 1: us art/ven abd/pelv/scrotum doppler ltd · 0.22mm/px · 13 of 129 slices shown]
[im 1/129]
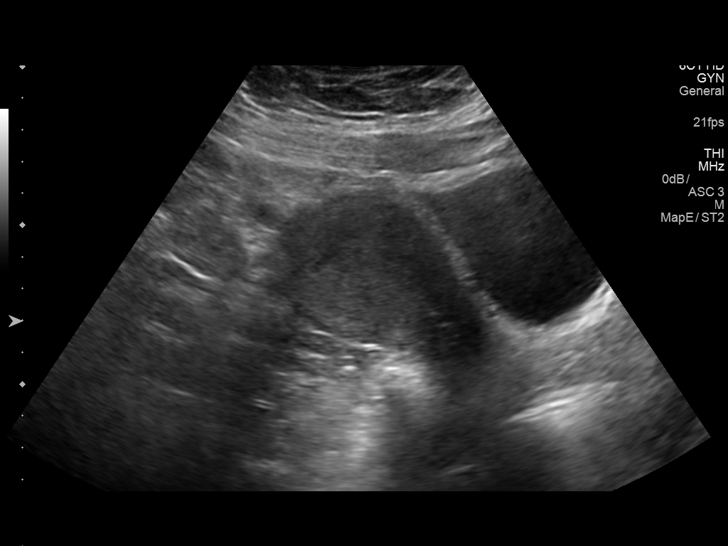
[im 11/129]
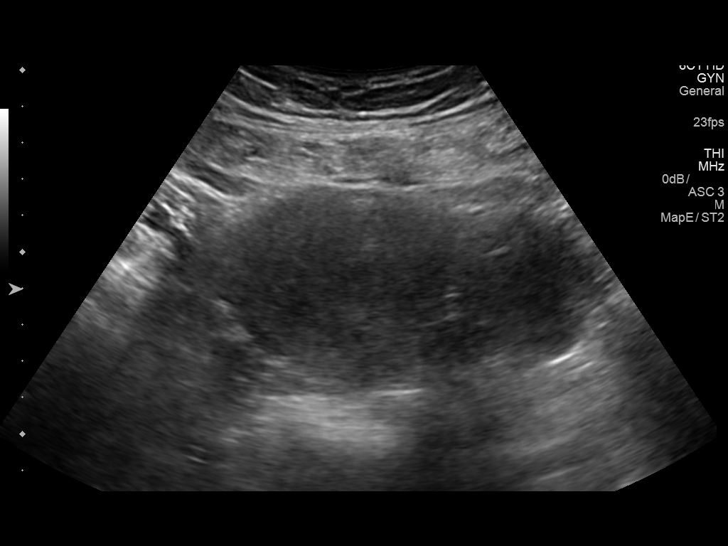
[im 22/129]
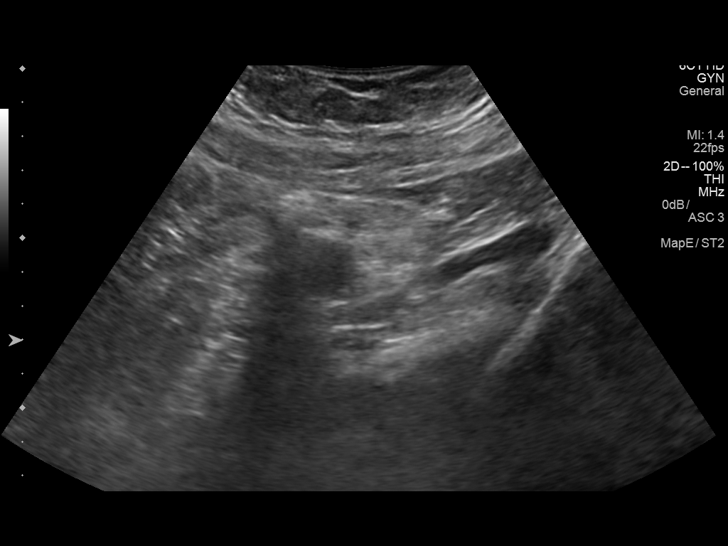
[im 33/129]
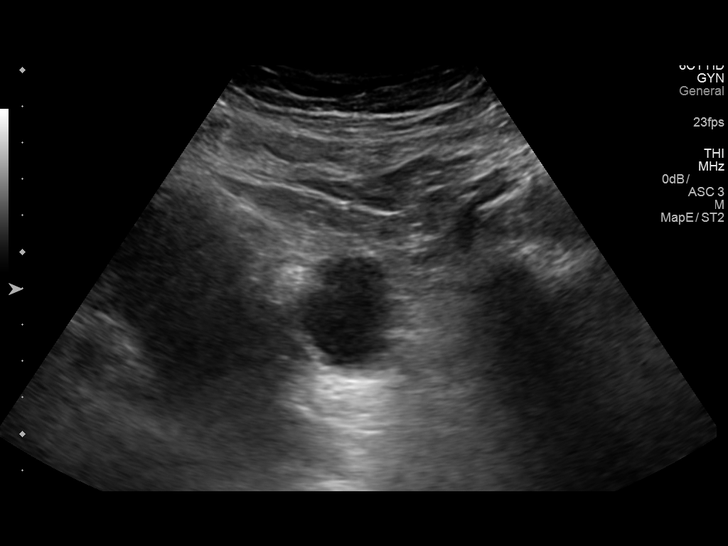
[im 43/129]
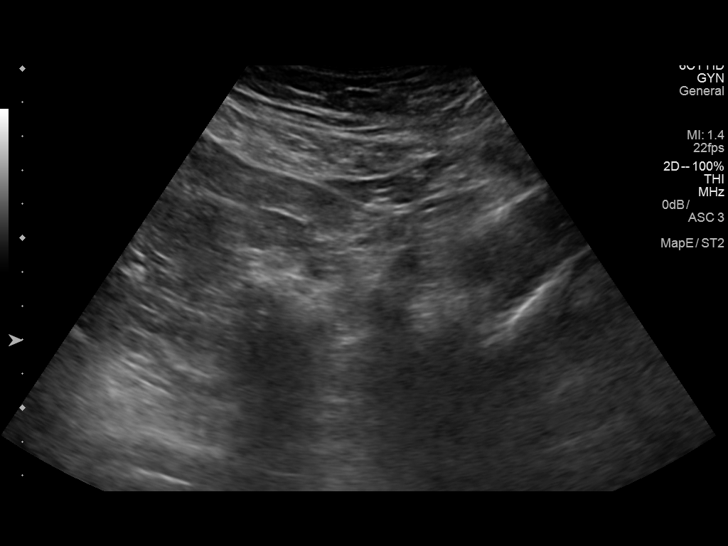
[im 54/129]
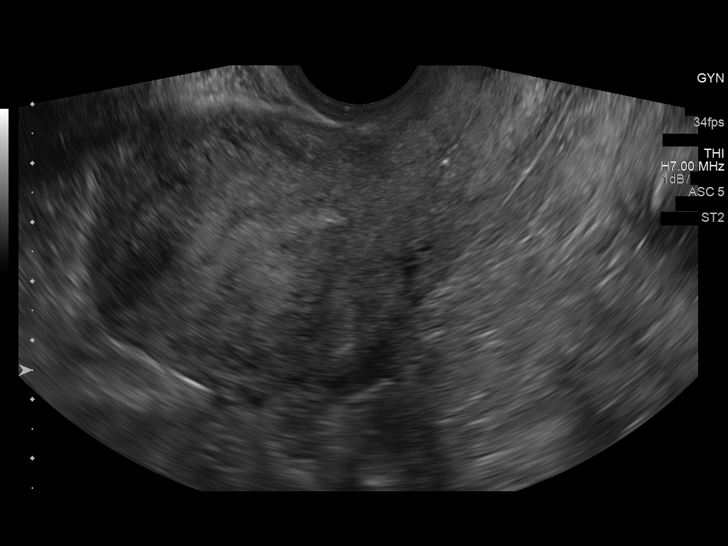
[im 65/129]
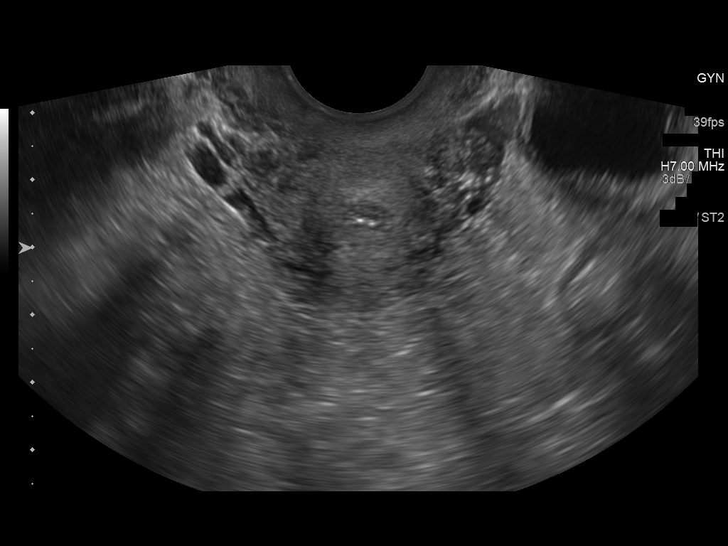
[im 75/129]
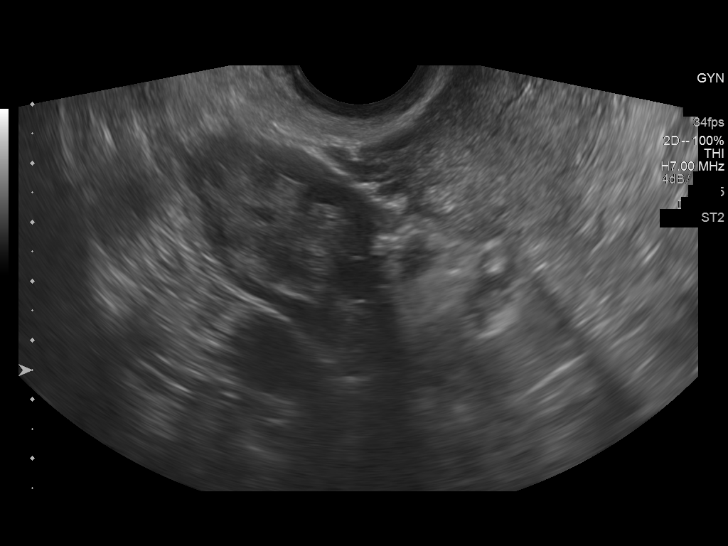
[im 86/129]
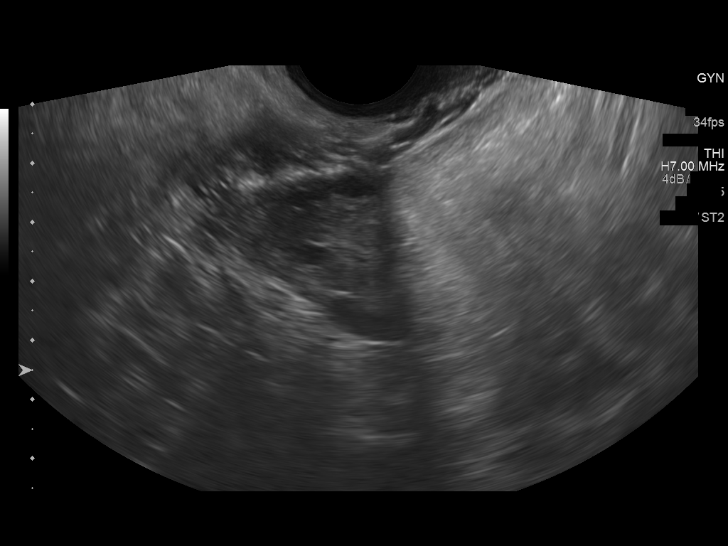
[im 97/129]
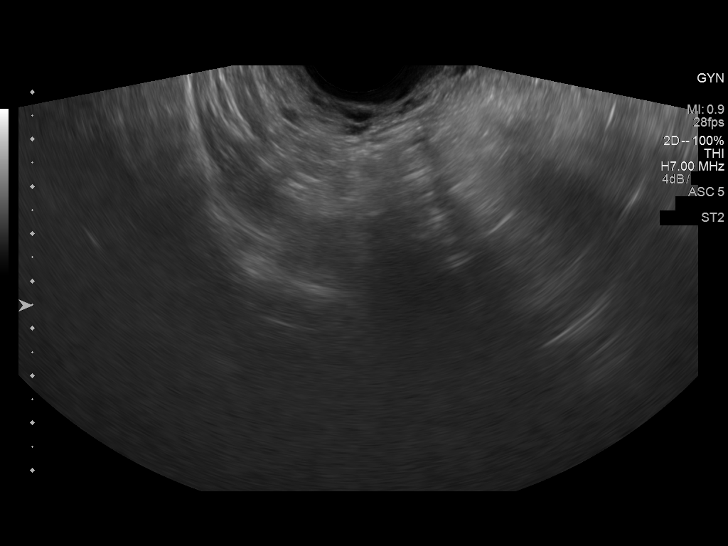
[im 107/129]
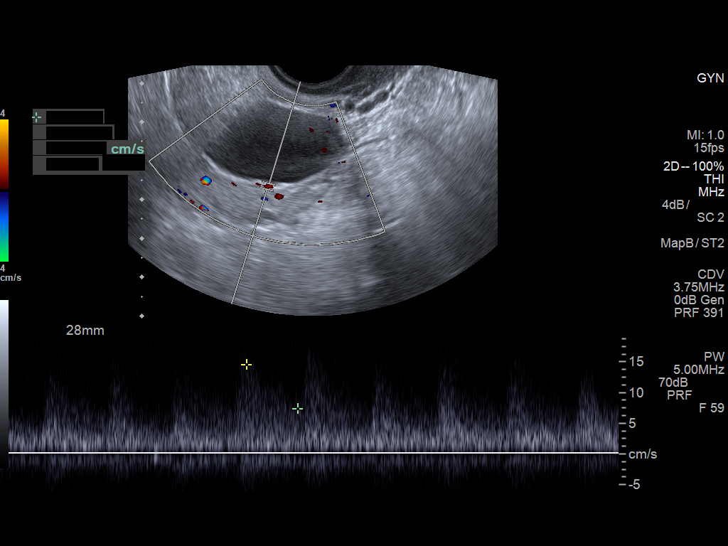
[im 118/129]
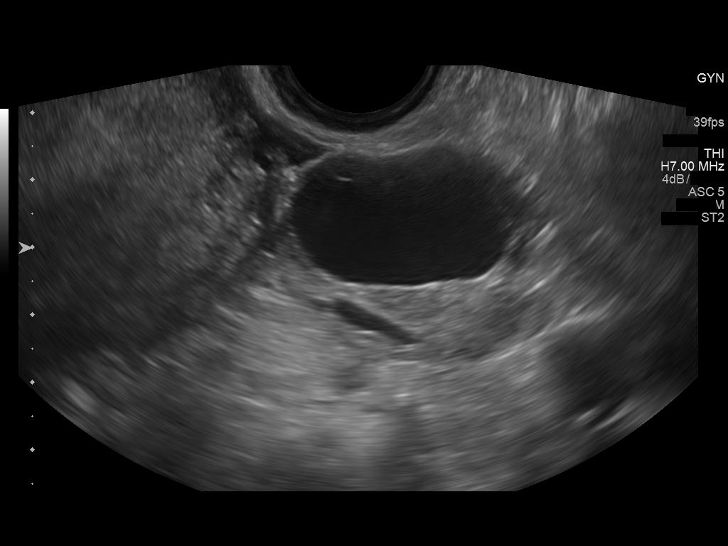
[im 129/129]
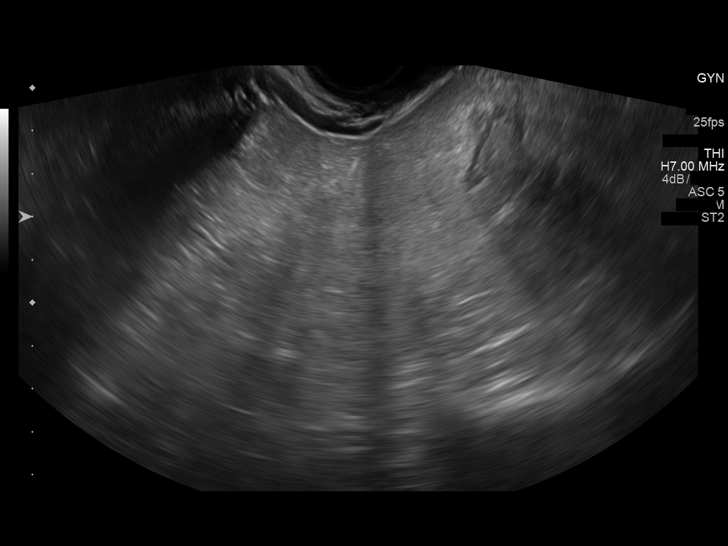

[13 of 25 positions shown; findings below may reference images not displayed]

FINDINGS: Uterus

Measurements: 9.2 x 5.0 x 6.1 cm. No fibroids or other mass
visualized.

Endometrium

Thickness: 5 mm.  No focal abnormality visualized.

Right ovary

Measurements: 5.1 x 2.6 x 2.2 cm. Normal appearance/no adnexal mass.

Left ovary

Measurements: 4.4 x 4.0 x 4.0 cm. 3.1 x 2.0 x 3.2 cm anechoic
structure with increased through transmission, no mural nodularity,
and no internal blood flow, compatible with a simple ovarian cyst.

Pulsed Doppler evaluation of both ovaries demonstrates normal
low-resistance arterial and venous waveforms.

Other findings

Trace volume of free fluid in the cul-de-sac, presumably
physiologic.
IMPRESSION: 1. No acute findings.  Specifically, no evidence of ovarian torsion.
2. Small simple cyst in the left ovary incidentally noted.
3. Trace volume of free fluid in the cul-de-sac, presumably
physiologic in this patient.

## 2017-07-06 ENCOUNTER — Telehealth: Payer: Self-pay | Admitting: *Deleted

## 2017-07-06 MED ORDER — GABAPENTIN 300 MG PO CAPS: 900.0000 mg | ORAL_CAPSULE | Freq: Every day | ORAL | 3 refills | Status: DC

## 2017-07-06 NOTE — Telephone Encounter (Signed)
Gabapentin escribed to [facility] in response to faxed request from them/fim 

## 2017-07-08 ENCOUNTER — Other Ambulatory Visit: Payer: Self-pay | Admitting: Neurology

## 2017-08-08 ENCOUNTER — Telehealth: Payer: Self-pay | Admitting: Neurology

## 2017-08-08 NOTE — Telephone Encounter (Signed)
Diagnosed with bronchitis a few weeks ago, feeling fluish, stomach upset, nose congestion which developed into difficulty with voice, crackling voice, coughing. She was told it was a virus, she has improved but her voice is still bothering her. Wanting to make sure this is not MS. I told her it was unlikely, but will let Dr. Epimenio Foot know. She is feeling better but her voice is still crackling like a laryngitis. No difficulty swallowing. Some chest congestion and coughing still but improved.

## 2017-08-10 ENCOUNTER — Encounter (HOSPITAL_COMMUNITY): Payer: Self-pay | Admitting: Emergency Medicine

## 2017-08-10 ENCOUNTER — Other Ambulatory Visit: Payer: Self-pay

## 2017-08-10 ENCOUNTER — Emergency Department (HOSPITAL_COMMUNITY)
Admission: EM | Admit: 2017-08-10 | Discharge: 2017-08-10 | Disposition: A | Discharge status: Home / Self Care | Payer: 59 | Attending: Emergency Medicine | Admitting: Emergency Medicine

## 2017-08-10 DIAGNOSIS — J029 Acute pharyngitis, unspecified: Secondary | ICD-10-CM | POA: Insufficient documentation

## 2017-08-10 DIAGNOSIS — Z5321 Procedure and treatment not carried out due to patient leaving prior to being seen by health care provider: Secondary | ICD-10-CM | POA: Insufficient documentation

## 2017-08-10 NOTE — Telephone Encounter (Signed)
Spoke with Taylor Donovan this am.  She was recently dx. with bronchitis. Sts. still having a lot of chest congestion, now voice going in and out.  No resp. distress.  She will f/u with her pcp/fim

## 2017-08-10 NOTE — ED Triage Notes (Signed)
Pt verbalizes continued sore throat and lost voice post diagnosis of bronchitis on Wednesday.

## 2017-08-17 ENCOUNTER — Telehealth: Payer: Self-pay | Admitting: Neurology

## 2017-08-17 NOTE — Telephone Encounter (Signed)
  Patient had laryngitis for over a week now developing numbness in both feet, knees buckle, she is driving.  I advised to see PCP or [facility]  for reflex exam - rule out GBS ?  Taylor Donovan She continued tecfidera.

## 2017-08-17 NOTE — Telephone Encounter (Signed)
I called patient back but she did not answer. I left her a voicemail advising her to go to ED to be evaluated.

## 2017-08-17 NOTE — Telephone Encounter (Signed)
Unable to reach at 645 pm

## 2017-08-17 NOTE — Telephone Encounter (Signed)
Pt calling to inform that she is having strange symptoms all day.  Pt states she has been shaking uncontrollably.  Her ankles and knees hurt and her feet are numb.  Please call

## 2017-08-17 NOTE — Telephone Encounter (Signed)
I tried to reach patient and her cell phone around 520 and again at 525 pm.  There was no answer.

## 2017-08-18 NOTE — Telephone Encounter (Signed)
LMTC./fim 

## 2017-08-18 NOTE — Telephone Encounter (Signed)
Spoke with [employer].  She is still getting over an URI, laryngitis.  PCP treated with antibiotics and a 12 day taper pk. of steroids. She c/o onset Sunday of worsening foot/ankle pain, numbness to feet, edema to feet.  Onset yesterday of right sided weakness, not progressing since onset.  Discussed with RAS and w/i appt. given tomorrow at 1330/fim

## 2017-08-19 ENCOUNTER — Encounter: Payer: Self-pay | Admitting: Neurology

## 2017-08-19 ENCOUNTER — Ambulatory Visit (INDEPENDENT_AMBULATORY_CARE_PROVIDER_SITE_OTHER): Payer: 59 | Admitting: Neurology

## 2017-08-19 VITALS — BP 146/83 | HR 99 | Resp 18 | Ht 67.0 in | Wt 206.0 lb

## 2017-08-19 DIAGNOSIS — R260 Ataxic gait: Secondary | ICD-10-CM | POA: Diagnosis not present

## 2017-08-19 DIAGNOSIS — R3915 Urgency of urination: Secondary | ICD-10-CM | POA: Diagnosis not present

## 2017-08-19 DIAGNOSIS — G35D Multiple sclerosis, unspecified: Secondary | ICD-10-CM

## 2017-08-19 DIAGNOSIS — M2550 Pain in unspecified joint: Secondary | ICD-10-CM | POA: Insufficient documentation

## 2017-08-19 DIAGNOSIS — H469 Unspecified optic neuritis: Secondary | ICD-10-CM | POA: Diagnosis not present

## 2017-08-19 DIAGNOSIS — R5383 Other fatigue: Secondary | ICD-10-CM

## 2017-08-19 DIAGNOSIS — G4700 Insomnia, unspecified: Secondary | ICD-10-CM

## 2017-08-19 DIAGNOSIS — G35 Multiple sclerosis: Secondary | ICD-10-CM | POA: Diagnosis not present

## 2017-08-19 MED ORDER — INDOMETHACIN 25 MG PO CAPS: 25.0000 mg | ORAL_CAPSULE | Freq: Three times a day (TID) | ORAL | 0 refills | Status: DC

## 2017-08-19 NOTE — Progress Notes (Signed)
[facility]  PATIENT: Taylor Donovan DOB: Oct 02, 1990  REFERRING DOCTOR OR PCP:  Vicenta Aly, FNP SOURCE: patient, notes from ED/hospital stay, lab results, imaging results, MRI images on PACS  _________________________________   HISTORICAL  CHIEF COMPLAINT:  Chief Complaint  Patient presents with  . Multiple Sclerosis    Sts. she continues to tolerate Tecfidera well and denies missed doses.  Recently treated for an URI/laryngitis.  Completed Amoxicillin, still taking prn Benadryl.  C/O increased pain/numbness/edema and heavy feeling both feet, right worse than left, and right sided weakness.  Sx. are some better since onset 3-4 days ago..  Taking an extra Gabapentin helped the pain in ther feet./fim    HISTORY OF PRESENT ILLNESS:  [name] is a 27 year old woman with MS reporting new left sided visual changes  Update 08/19/2017: She had the onset of laryngitis about 3 weeks.   She was tested for flu but not for strep throat.  She was placed on Amoxicillin.    She felt her throat infection was improving last week.   She went back to work over the weekend.      She was fine Saturday but on Sunday, as the evening went on, she had much more pain, mostly in the ankles and knees.   She also noted more tingling in her toes and a tremor.     She notes that increasing the gabapentin helped the tingling in the feet but not the leg pain.   The baclofen is helping her muscle spasms and also helping sleep.     No definite exacerbation.   She tolerates Tecfidera well.    Update 06/10/2017: She feels her MS is stable,     She is on Tecfidera and she tolerates it well.   She denies exacerbations.    No abdominal symptoms or flushing.     She has more tenderness in her legs, right > left.   She notes some stiffness/spasticity.  Pain is achy.   She notes it more when she is not using.   It sometimes bothers her at night.    Flexeril at night did not help much.   She  has not tried baclofen.    She is walking well and denies weakness.    She was having groin numbness and pain, helped by lamotrigine.    She started doing better and stopped the lamotrigine.   She is still on gabapentin.    Bladder is doing well on oxybutynin.         She has fatigue. She sometimes works 11-12 hours.    At first phentermine was helping but she felt it helped less after a few months.   Mood is doing better on escitalopram.     From 12/10/2016: Groin pain is better since starting lamotrigine.     However, her hips and lower back are hurting more.   Pain fluctuates but is not necessarily worse with changes in position.     LBP does not radiate into the legs.  She is noting some verbal fluency issues and STM seems a little worse.  Attention and focus seem worse.    She is often sleepy and notes fatigue and sometimes takes naps.    She is waking up more at night, sometimes to urinate and sometimes randomly.     After the left optic neuritis in March, vision improved again.   Colors are washed out a bit.     She was having some allergies  affecting her eyes and she was advised to take a Benadryl at bedtime.     __________________   She is tolerating Tecfidera well and she denies any exacerbation.     About 10 days ago, she had the onset of left visual blurriness associated with left eye pain.    Pain worseend with moving her eyes.   She had flu-like symptoms and neck stiffness with her IV steroid treatment.    She had similar but milder symptoms the first time she was on IV Steroids.   Etodolac and Flexeril helped her myalgias.     MS:   She did not qualify for a drug study and we have had trouble getting her started on Tecfidera.   She does not feel she could self inject and so can not be on interferon or glatiramer.  We were finally able to get approval for Tecfidera and she should be getting her first shipment within a week.    Gait/strength/sensation: She feels mildly clumsy with her  gait but has no falls. 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  . She denies any significant weakness or numbness in the arms or legs.  Bladder/bowel: She notes urinary frequency with extreme urgency a times and has nocturia twice most nights.  She has rare incontinence when she can't get to bathroom in time (happened once at work).   She notes some constipation.  Fatigue/sleep: She notes fatigue on a daily basis that is worse as the day goes on. She always feels sleepy. She falls asleep easily and will get a couple hours of sleep. However, once she wakes up she has difficulty falling back asleep. This has been present the last year. He did worse while on steroids.   She denies RLS.    Mood/Cognition: She denies much depression or anxiety. She has no major cognitive issues but she has had some problems with verbal fluency. The difficulties with verbal fluency began this year.  MS History:   She presented with left optic neuritis in October 2017.   MRI of the brain showed several other foci, worrisome for multiple sclerosis. One of the foci is in the right middle cerebellar peduncle and a couple of the foci are periventricular. I personally reviewed the MRI of the brain dated 01/07/2016. There are several T2/FLAIR hyperintense foci, one is in the right middle cerebellar peduncle and a couple are in the periventricular white matter. Other foci are in the subcortical deep white matter. None of the foci enhanced after contrast admission to the hospital.   I also reviewed the MRI of the orbits performed that day. That MRI was normal.   I reviewed the admission and discharge notes from her emergency room visit and hospital stay this month.   TSH was elevated and B12 was reduced.   ESR, CRP, RPR, Lyme Abs/DNA, ANA were negative.     REVIEW OF SYSTEMS: Constitutional: No fevers, chills, sweats, or change in appetite. She notes fatigue and some  sleepiness. Eyes: as above Ear, nose and throat: No hearing loss, ear pain, nasal congestion, sore throat Cardiovascular: No chest pain, palpitations Respiratory: No shortness of breath at rest or with exertion.   No wheezes.   She snores. GastrointestinaI: No nausea, vomiting, diarrhea, abdominal pain, fecal incontinence Genitourinary: No dysuria, urinary retention or frequency.  2 x nocturia. Musculoskeletal: No neck pain, back pain Integumentary: No rash, pruritus, skin lesions Neurological: as above Psychiatric: Notes some  depression better on Lexapro.  No anxiety Endocrine: No palpitations, diaphoresis, change in appetite, change in weigh or increased thirst Hematologic/Lymphatic: No anemia, purpura, petechiae. Allergic/Immunologic: No itchy/runny eyes, nasal congestion, recent allergic reactions, rashes  ALLERGIES: No Known Allergies  HOME MEDICATIONS:  Current Outpatient Medications:  .  baclofen (LIORESAL) 10 MG tablet, Take 1 tablet (10 mg total) by mouth 3 (three) times daily., Disp: 270 each, Rfl: 3 .  diphenhydrAMINE (BENADRYL) 25 mg capsule, Take 50 mg by mouth at bedtime as needed for sleep., Disp: , Rfl:  .  escitalopram (LEXAPRO) 20 MG tablet, Take 1 tablet (20 mg total) by mouth daily., Disp: 90 tablet, Rfl: 1 .  gabapentin (NEURONTIN) 300 MG capsule, Take 3 capsules (900 mg total) by mouth at bedtime., Disp: 270 capsule, Rfl: 3 .  levonorgestrel-ethinyl estradiol (ORSYTHIA) 0.1-20 MG-MCG tablet, Take 1 tablet by mouth daily., Disp: , Rfl:  .  oxybutynin (DITROPAN) 5 MG tablet, TAKE 1 TABLET TWICE A DAY, Disp: 180 tablet, Rfl: 3 .  phentermine 37.5 MG capsule, Take 1 capsule (37.5 mg total) by mouth every morning., Disp: 90 capsule, Rfl: 1 .  SYNTHROID 125 MCG tablet, TAKE 1 TABLET ON AN EMPTY STOMACH IN THE MORNING ONCE A DAY ORALLY 30 DAY(S), Disp: , Rfl: 5 .  TECFIDERA 240 MG CPDR, TAKE 240 MG (1 CAPSULE) BY MOUTH TWICE A DAY, Disp: 180 capsule, Rfl: 3 .   amoxicillin (AMOXIL) 500 MG capsule, Take 500 mg by mouth 3 (three) times daily., Disp: , Rfl: 0 .  indomethacin (INDOCIN) 25 MG capsule, Take 1 capsule (25 mg total) by mouth 3 (three) times daily with meals., Disp: 90 capsule, Rfl: 0  PAST MEDICAL HISTORY: Past Medical History:  Diagnosis Date  . Acid reflux   . Depression   . Hypothyroid   . Ovarian cyst   . PCOS (polycystic ovarian syndrome)   . Thyroid disease   . Vision abnormalities     PAST SURGICAL HISTORY: History reviewed. No pertinent surgical history.  FAMILY HISTORY: Family History  Problem Relation Age of Onset  . Hypertension Mother   . Diabetes Mellitus II Mother   . Healthy Father     SOCIAL HISTORY:  Social History   Socioeconomic History  . Marital status: Single    Spouse name: Not on file  . Number of children: Not on file  . Years of education: Not on file  . Highest education level: Not on file  Occupational History  . Not on file  Social Needs  . Financial resource strain: Not on file  . Food insecurity:    Worry: Not on file    Inability: Not on file  . Transportation needs:    Medical: Not on file    Non-medical: Not on file  Tobacco Use  . Smoking status: Current Every Day Smoker    Packs/day: 0.50    Years: 8.00    Pack years: 4.00    Types: Cigarettes  . Smokeless tobacco: Never Used  Substance and Sexual Activity  . Alcohol use: Yes    Comment: occasionally  . Drug use: No  . Sexual activity: Yes    Birth control/protection: Pill  Lifestyle  . Physical activity:    Days per week: Not on file    Minutes per session: Not on file  . Stress: Not on file  Relationships  . Social connections:    Talks on phone: Not on file    Gets together: Not on file  Attends religious service: Not on file    Active member of club or organization: Not on file    Attends meetings of clubs or organizations: Not on file    Relationship status: Not on file  . Intimate partner violence:     Fear of current or ex partner: Not on file    Emotionally abused: Not on file    Physically abused: Not on file    Forced sexual activity: Not on file  Other Topics Concern  . Not on file  Social History Narrative  . Not on file     PHYSICAL EXAM  Vitals:   08/19/17 1340  BP: (!) 146/83  Pulse: 99  Resp: 18  Weight: 206 lb (93.4 kg)  Height: _0  (1.702 m)    Body mass index is 32.26 kg/m.   General: The patient is well-developed and well-nourished and in no acute distress.  Eyes:  Funduscopic examination shows mild optic nerve pallor on the left  Muscular skeletal: She has mild tenderness with deep palpation of both ankles.  Range of motion appeared normal in the joints.  No obvious effusions.   Neurologic Exam  Mental status: The patient is alert and oriented x 3 at the time of the examination. The patient has apparent normal recent and remote memory, with an apparently normal attention span and concentration ability.   Speech is normal.  Cranial nerves: Extraocular movements are full. She has a 2+ left APD.Marland Kitchen Color vision is mildly reduced out of the left eye..  Facial strength and sensation is normal. Trapezius strength is normal. No dysarthria is noted.  The tongue is midline, and the patient has symmetric elevation of the soft palate. No obvious hearing deficits are noted.  Motor:  Muscle bulk is normal.   Muscle tone is normal.  Strength is 5/5.  Sensory: She reported symmetric touch and vibration sensation in the arms and legs.  Coordination: Cerebellar testing shows good finger-nose-finger and heel-to-shin bilaterally..  Gait and station: Station is normal.   Gait is arthritic. Tandem gait is wide and arthritic.. Romberg is negative.   Reflexes: Deep tendon reflexes are symmetric and normal bilaterally.       DIAGNOSTIC DATA (LABS, IMAGING, TESTING) - I reviewed patient records, labs, notes, testing and imaging myself where available.  Lab Results   Component Value Date   WBC 6.0 06/10/2017   HGB 12.8 06/10/2017   HCT 37.2 06/10/2017   MCV 87 06/10/2017   PLT 272 06/10/2017      Component Value Date/Time   NA 138 03/15/2017 1942   K 4.0 03/15/2017 1942   CL 107 03/15/2017 1942   CO2 27 03/15/2017 1942   GLUCOSE 79 03/15/2017 1942   BUN 10 03/15/2017 1942   CREATININE 0.67 03/15/2017 1942   CALCIUM 8.9 03/15/2017 1942   PROT 7.1 03/15/2017 1942   ALBUMIN 4.2 03/15/2017 1942   AST 15 03/15/2017 1942   ALT 15 03/15/2017 1942   ALKPHOS 60 03/15/2017 1942   BILITOT 0.5 03/15/2017 1942   GFRNONAA >60 03/15/2017 1942   GFRAA >60 03/15/2017 1942   No results found for: CHOL, HDL, LDLCALC, LDLDIRECT, TRIG, CHOLHDL      ASSESSMENT AND PLAN  Multiple sclerosis (HCC)  Multiple joint pain - Plan: Antistreptolysin O titer, Anti-DNAse B antibody, Sedimentation rate, C-reactive protein, Uric Acid, Rheumatoid factor  Optic neuritis  Ataxic gait  Insomnia, unspecified type  Urinary urgency  Other fatigue    1.    She  has a great increase in joint pain over the past few days.  Of note, this began about 10 to 12 days after her laryngitis.  I am concerned about the possibility of post streptococcal arthritis.  We will check some lab work for strep as well as for generalized arthritis..  I will also have her try indomethacin to see if that works better than the medications.    2.    Continue Tecfidera 240 mg twice a day.  Lymphocytes were fine recently checked 2.    Continue Oxybutynin 5 mg twice a day for her bladder and gabapentin 300-600 mg nightly for sleep maintenance insomnia and dysesthesias 3.    Continue Phentermine 37.5 mg daily for obesity and reduced attention and focus and fatigue  4.   Baclofen for spasms/discomfort Return in 5-6 months or sooner if there are new or worsening neurologic symptoms.   Damarco Keysor A. Felecia Shelling, MD, PhD 5/45/6256, 3:89 PM Certified in Neurology, Clinical Neurophysiology, Sleep Medicine,  Pain Medicine and Neuroimaging  [facility] [address] [place], [place] [address] [phone]

## 2017-08-20 ENCOUNTER — Telehealth: Payer: Self-pay | Admitting: *Deleted

## 2017-08-20 LAB — C-REACTIVE PROTEIN: CRP: 0.7 mg/L (ref 0.0–4.9)

## 2017-08-20 LAB — RHEUMATOID FACTOR

## 2017-08-20 LAB — ANTISTREPTOLYSIN O TITER

## 2017-08-20 LAB — SEDIMENTATION RATE: SED RATE: 2 mm/h (ref 0–32)

## 2017-08-20 LAB — URIC ACID: Uric Acid: 4.9 mg/dL (ref 2.5–7.1)

## 2017-08-20 LAB — ANTI-DNASE B ANTIBODY: DNase B Ab: <78 U/mL (ref 0–120)

## 2017-08-20 NOTE — Telephone Encounter (Signed)
-----   Message from Asa Lente, MD sent at 08/20/2017  8:31 AM EDT ----- Please let the patient know that the strep and rheumatoid arthritis labwork was negative.    Hopefully the indomethacin will help

## 2017-08-20 NOTE — Telephone Encounter (Signed)
Spoke with [place] and reviewed below lab results.  She verbalized understanding of same, sts. is feeling better since starting the Indomethacin yesterday.  She will call back if needed/fim

## 2017-09-15 ENCOUNTER — Other Ambulatory Visit: Payer: Self-pay | Admitting: Neurology

## 2017-11-04 ENCOUNTER — Other Ambulatory Visit: Payer: Self-pay | Admitting: Neurology

## 2017-12-16 ENCOUNTER — Encounter: Payer: Self-pay | Admitting: Neurology

## 2017-12-16 ENCOUNTER — Other Ambulatory Visit: Payer: Self-pay

## 2017-12-16 ENCOUNTER — Telehealth: Payer: Self-pay | Admitting: Neurology

## 2017-12-16 ENCOUNTER — Ambulatory Visit (INDEPENDENT_AMBULATORY_CARE_PROVIDER_SITE_OTHER): Payer: 59 | Admitting: Neurology

## 2017-12-16 DIAGNOSIS — R5383 Other fatigue: Secondary | ICD-10-CM | POA: Diagnosis not present

## 2017-12-16 DIAGNOSIS — R260 Ataxic gait: Secondary | ICD-10-CM | POA: Diagnosis not present

## 2017-12-16 DIAGNOSIS — M25511 Pain in right shoulder: Secondary | ICD-10-CM

## 2017-12-16 DIAGNOSIS — G35 Multiple sclerosis: Secondary | ICD-10-CM

## 2017-12-16 DIAGNOSIS — G4700 Insomnia, unspecified: Secondary | ICD-10-CM

## 2017-12-16 MED ORDER — TRAZODONE HCL 100 MG PO TABS: 100.0000 mg | ORAL_TABLET | Freq: Every day | ORAL | 3 refills | Status: DC

## 2017-12-16 MED ORDER — PHENTERMINE HCL 37.5 MG PO CAPS: 37.5000 mg | ORAL_CAPSULE | ORAL | 1 refills | Status: DC

## 2017-12-16 NOTE — Progress Notes (Signed)
[facility]  PATIENT: Taylor Donovan DOB: 1990/09/14  REFERRING DOCTOR OR PCP:  Vicenta Aly, FNP SOURCE: patient, notes from ED/hospital stay, lab results, imaging results, MRI images on PACS  _________________________________   HISTORICAL  CHIEF COMPLAINT:  Chief Complaint  Patient presents with  . Multiple Sclerosis    Sts. she continues to tolerate Tecfidera well.  Sts. fatigue over the last week has been worse, having trouble going to and staying asleep.  Also more neck pain; lifting right arm above midline is painfule and she has numbness in all fingertips. no known injury. 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   She is tolerating the Tecfidera well and currently has no side effects.,    She denies difficulty with her gait and no weakness.   She has some numbness in her feet, bilateral.  This is most noticeable with driving, even short distances.   She is on gabapentin and that helps some.   She takes 900 mg nightly.    She has difficulty falling asleep and staying asleep.   Sometimes she has trouble getting comfortable and other times she is not sure why she has insomnia.   She is still on baclofen 10 mg po bid.    She notes some fatigue, worse if she slept poorly.  Fatigue is better since changing jobs.   She was on phentermine and felt it helped her a little bit.   Taking a nap helps.  She does not snore.      Mood is doing well.   She notes some difficulty with her short term memory and focus/attention.     The right shoulder is more painful and ROM is reduced, especially elevation.   Update 08/19/2017: She had the onset of laryngitis about 3 weeks.   She  was tested for flu but not for strep throat.  She was placed on Amoxicillin.    She felt her throat infection was improving last week.   She went back to work over the weekend.      She was fine Saturday but on Sunday, as the evening went on, she had much more pain, mostly in the ankles and knees.   She also noted more tingling in her toes and a tremor.     She notes that increasing the gabapentin helped the tingling in the feet but not the leg pain.   The baclofen is helping her muscle spasms and also helping sleep.     No definite exacerbation.   She tolerates Tecfidera well.    Update 06/10/2017: She feels her MS is stable,     She is on Tecfidera and she tolerates it well.   She denies exacerbations.    No abdominal symptoms or flushing.     She has more tenderness in her legs, right > left.   She notes some stiffness/spasticity.  Pain is achy.   She notes it more when she is not using.   It sometimes bothers her at night.    Flexeril at night did not help much.   She has not tried baclofen.    She is walking well and denies weakness.    She was having  groin numbness and pain, helped by lamotrigine.    She started doing better and stopped the lamotrigine.   She is still on gabapentin.    Bladder is doing well on oxybutynin.         She has fatigue. She sometimes works 11-12 hours.    At first phentermine was helping but she felt it helped less after a few months.   Mood is doing better on escitalopram.     From 12/10/2016: Groin pain is better since starting lamotrigine.     However, her hips and lower back are hurting more.   Pain fluctuates but is not necessarily worse with changes in position.     LBP does not radiate into the legs.  She is noting some verbal fluency issues and STM seems a little worse.  Attention and focus seem worse.    She is often sleepy and notes fatigue and sometimes takes naps.    She is waking up more at night, sometimes to urinate and sometimes randomly.     After the  left optic neuritis in March, vision improved again.   Colors are washed out a bit.     She was having some allergies affecting her eyes and she was advised to take a Benadryl at bedtime.     __________________   She is tolerating Tecfidera well and she denies any exacerbation.     About 10 days ago, she had the onset of left visual blurriness associated with left eye pain.    Pain worseend with moving her eyes.   She had flu-like symptoms and neck stiffness with her IV steroid treatment.    She had similar but milder symptoms the first time she was on IV Steroids.   Etodolac and Flexeril helped her myalgias.     MS:   She did not qualify for a drug study and we have had trouble getting her started on Tecfidera.   She does not feel she could self inject and so can not be on interferon or glatiramer.  We were finally able to get approval for Tecfidera and she should be getting her first shipment within a week.    Gait/strength/sensation: She feels mildly clumsy with her gait but has no falls. 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  . She denies any significant weakness or numbness in the arms or legs.  Bladder/bowel: She notes urinary frequency with extreme urgency a times and has nocturia twice most nights.  She has rare incontinence when she can't get to bathroom in time (happened once at work).   She notes some constipation.  Fatigue/sleep: She notes fatigue on a daily basis that is worse as the day goes on. She always feels sleepy. She falls asleep easily and will get a couple hours of sleep. However, once she wakes up she has difficulty falling back asleep. This has been present the last year. He did worse while on steroids.   She denies RLS.    Mood/Cognition: She denies much depression or anxiety. She has no major cognitive issues but she has had some problems with verbal fluency. The difficulties with verbal fluency began this  year.  MS History:   She presented with left optic neuritis in October 2017.   MRI of the brain showed several other foci, worrisome for multiple sclerosis. One of the foci is in the right middle cerebellar peduncle and a couple of the foci are periventricular.  I personally reviewed the MRI of the brain dated 01/07/2016. There are several T2/FLAIR hyperintense foci, one is in the right middle cerebellar peduncle and a couple are in the periventricular white matter. Other foci are in the subcortical deep white matter. None of the foci enhanced after contrast admission to the hospital.   I also reviewed the MRI of the orbits performed that day. That MRI was normal.   I reviewed the admission and discharge notes from her emergency room visit and hospital stay this month.   TSH was elevated and B12 was reduced.   ESR, CRP, RPR, Lyme Abs/DNA, ANA were negative.     REVIEW OF SYSTEMS: Constitutional: No fevers, chills, sweats, or change in appetite. She notes fatigue and some sleepiness. Eyes: as above Ear, nose and throat: No hearing loss, ear pain, nasal congestion, sore throat Cardiovascular: No chest pain, palpitations Respiratory: No shortness of breath at rest or with exertion.   No wheezes.   She snores. GastrointestinaI: No nausea, vomiting, diarrhea, abdominal pain, fecal incontinence Genitourinary: No dysuria, urinary retention or frequency.  2 x nocturia. Musculoskeletal: No neck pain, back pain Integumentary: No rash, pruritus, skin lesions Neurological: as above Psychiatric: Notes some depression better on Lexapro.  No anxiety Endocrine: No palpitations, diaphoresis, change in appetite, change in weigh or increased thirst Hematologic/Lymphatic: No anemia, purpura, petechiae. Allergic/Immunologic: No itchy/runny eyes, nasal congestion, recent allergic reactions, rashes  ALLERGIES: No Known Allergies  HOME MEDICATIONS:  Current Outpatient Medications:  .  baclofen (LIORESAL) 10 MG  tablet, Take 1 tablet (10 mg total) by mouth 3 (three) times daily., Disp: 270 each, Rfl: 3 .  diphenhydrAMINE (BENADRYL) 25 mg capsule, Take 50 mg by mouth at bedtime as needed for sleep., Disp: , Rfl:  .  escitalopram (LEXAPRO) 20 MG tablet, TAKE 1 TABLET BY MOUTH EVERY DAY, Disp: 30 tablet, Rfl: 5 .  gabapentin (NEURONTIN) 300 MG capsule, Take 3 capsules (900 mg total) by mouth at bedtime., Disp: 270 capsule, Rfl: 3 .  indomethacin (INDOCIN) 25 MG capsule, TAKE 1 CAPSULE (25 MG TOTAL) BY MOUTH 3 (THREE) TIMES DAILY WITH MEALS., Disp: 90 capsule, Rfl: 0 .  levonorgestrel-ethinyl estradiol (ORSYTHIA) 0.1-20 MG-MCG tablet, Take 1 tablet by mouth daily., Disp: , Rfl:  .  oxybutynin (DITROPAN) 5 MG tablet, TAKE 1 TABLET TWICE A DAY, Disp: 180 tablet, Rfl: 3 .  phentermine 37.5 MG capsule, Take 1 capsule (37.5 mg total) by mouth every morning., Disp: 90 capsule, Rfl: 1 .  SYNTHROID 125 MCG tablet, TAKE 1 TABLET ON AN EMPTY STOMACH IN THE MORNING ONCE A DAY ORALLY 30 DAY(S), Disp: , Rfl: 5 .  TECFIDERA 240 MG CPDR, TAKE 240 MG (1 CAPSULE) BY MOUTH TWICE A DAY, Disp: 180 capsule, Rfl: 3  PAST MEDICAL HISTORY: Past Medical History:  Diagnosis Date  . Acid reflux   . Depression   . Hypothyroid   . Ovarian cyst   . PCOS (polycystic ovarian syndrome)   . Thyroid disease   . Vision abnormalities     PAST SURGICAL HISTORY: No past surgical history on file.  FAMILY HISTORY: Family History  Problem Relation Age of Onset  . Hypertension Mother   . Diabetes Mellitus II Mother   . Healthy Father     SOCIAL HISTORY:  Social History   Socioeconomic History  . Marital status: Single    Spouse name: Not on file  . Number of children: Not on file  . Years of education: Not on file  .  Highest education level: Not on file  Occupational History  . Not on file  Social Needs  . Financial resource strain: Not on file  . Food insecurity:    Worry: Not on file    Inability: Not on file  .  Transportation needs:    Medical: Not on file    Non-medical: Not on file  Tobacco Use  . Smoking status: Current Every Day Smoker    Packs/day: 0.50    Years: 8.00    Pack years: 4.00    Types: Cigarettes  . Smokeless tobacco: Never Used  Substance and Sexual Activity  . Alcohol use: Yes    Comment: occasionally  . Drug use: No  . Sexual activity: Yes    Birth control/protection: Pill  Lifestyle  . Physical activity:    Days per week: Not on file    Minutes per session: Not on file  . Stress: Not on file  Relationships  . Social connections:    Talks on phone: Not on file    Gets together: Not on file    Attends religious service: Not on file    Active member of club or organization: Not on file    Attends meetings of clubs or organizations: Not on file    Relationship status: Not on file  . Intimate partner violence:    Fear of current or ex partner: Not on file    Emotionally abused: Not on file    Physically abused: Not on file    Forced sexual activity: Not on file  Other Topics Concern  . Not on file  Social History Narrative  . Not on file     PHYSICAL EXAM  There were no vitals filed for this visit.  There is no height or weight on file to calculate BMI.   General: The patient is well-developed and well-nourished and in no acute distress.  Eyes:  Funduscopic examination shows mild optic nerve pallor on the left  Musculoskeletal: She has tenderness over the right subacromial bursa and shoulder ROM is decreased  Neurologic Exam  Mental status: The patient is alert and oriented x 3 at the time of the examination. The patient has apparent normal recent and remote memory, with an apparently normal attention span and concentration ability.   Speech is normal.  Cranial nerves: Extraocular movements are full. Reduced left color vision and APD on the left.   Facial strength and sensation is normal. Trapezius strength is normal. No dysarthria is noted.  The  tongue is midline, and the patient has symmetric elevation of the soft palate. No obvious hearing deficits are noted.  Motor:  Muscle bulk is normal.   Muscle tone is normal.  Strength is 5/5.  Sensory: She reported symmetric touch and vibration sensation in the arms and legs.  Coordination: Cerebellar testing shows good finger-nose-finger and heel-to-shin bilaterally..  Gait and station: Station is normal.   Gait is normal. Tandem gait is wide.. Romberg is negative.   Reflexes: Deep tendon reflexes are symmetric and normal bilaterally.       DIAGNOSTIC DATA (LABS, IMAGING, TESTING) - I reviewed patient records, labs, notes, testing and imaging myself where available.  Lab Results  Component Value Date   WBC 6.0 06/10/2017   HGB 12.8 06/10/2017   HCT 37.2 06/10/2017   MCV 87 06/10/2017   PLT 272 06/10/2017      Component Value Date/Time   NA 138 03/15/2017 1942   K 4.0 03/15/2017 1942  CL 107 03/15/2017 1942   CO2 27 03/15/2017 1942   GLUCOSE 79 03/15/2017 1942   BUN 10 03/15/2017 1942   CREATININE 0.67 03/15/2017 1942   CALCIUM 8.9 03/15/2017 1942   PROT 7.1 03/15/2017 1942   ALBUMIN 4.2 03/15/2017 1942   AST 15 03/15/2017 1942   ALT 15 03/15/2017 1942   ALKPHOS 60 03/15/2017 1942   BILITOT 0.5 03/15/2017 1942   GFRNONAA >60 03/15/2017 1942   GFRAA >60 03/15/2017 1942   No results found for: CHOL, HDL, LDLCALC, LDLDIRECT, TRIG, CHOLHDL      ASSESSMENT AND PLAN  No diagnosis found.    1.     Continue Tecfidera 240 mg twice a day.  Lymphocytes were fine recently checked 2.    Try trazodone for sleep.   If not better, consider Ambien. 3.    retry Phentermine 37.5 mg daily for obesity and reduced attention and focus and fatigue  4.   Baclofen for spasms/discomfort 5.   Inject right subacromial bursa with 40 mg Depo-Medrol in 2.5 cc Marcaine using sterile technique.   She tolerated the injection well and pain and ROM improved dramatically Return in 5-6 months  or sooner if there are new or worsening neurologic symptoms.   Theophilus Walz A. Felecia Shelling, MD, PhD 12/01/2838, 6:98 PM Certified in Neurology, Clinical Neurophysiology, Sleep Medicine, Pain Medicine and Neuroimaging  [facility] [address], [place], [place] [address] [phone]

## 2017-12-16 NOTE — Telephone Encounter (Signed)
[employer]/medicaid order sent to [facility]. They will obtain the auth and will reach out to the pt to schedule.

## 2017-12-17 ENCOUNTER — Telehealth: Payer: Self-pay | Admitting: *Deleted

## 2017-12-17 LAB — CBC WITH DIFFERENTIAL/PLATELET
BASOS: 0 %
Basophils Absolute: 0 10*3/uL (ref 0.0–0.2)
EOS (ABSOLUTE): 0.3 10*3/uL (ref 0.0–0.4)
EOS: 5 %
HEMATOCRIT: 39 % (ref 34.0–46.6)
Hemoglobin: 12.8 g/dL (ref 11.1–15.9)
Immature Grans (Abs): 0 10*3/uL (ref 0.0–0.1)
Immature Granulocytes: 0 %
LYMPHS ABS: 0.9 10*3/uL (ref 0.7–3.1)
Lymphs: 15 %
MCH: 29.6 pg (ref 26.6–33.0)
MCHC: 32.8 g/dL (ref 31.5–35.7)
MCV: 90 fL (ref 79–97)
MONOCYTES: 9 %
MONOS ABS: 0.5 10*3/uL (ref 0.1–0.9)
NEUTROS ABS: 4.1 10*3/uL (ref 1.4–7.0)
Neutrophils: 71 %
Platelets: 245 10*3/uL (ref 150–450)
RBC: 4.33 x10E6/uL (ref 3.77–5.28)
RDW: 12.8 % (ref 12.3–15.4)
WBC: 5.8 10*3/uL (ref 3.4–10.8)

## 2017-12-17 NOTE — Telephone Encounter (Signed)
-----   Message from Richard A Sater, MD sent at 12/17/2017  4:40 PM EDT ----- Please let the patient know that the lab work is fine.  

## 2017-12-17 NOTE — Telephone Encounter (Signed)
Spoke with [place] and reviewed below lab results.  She verbalized understanding of same/fim

## 2018-01-04 NOTE — Telephone Encounter (Signed)
Taylor Donovan: [id number] (exp. 01/01/18 to 04/01/17) patient is scheduled at [facility] for 01/05/18.

## 2018-01-05 ENCOUNTER — Telehealth: Payer: Self-pay | Admitting: *Deleted

## 2018-01-05 ENCOUNTER — Ambulatory Visit
Admission: RE | Admit: 2018-01-05 | Discharge: 2018-01-05 | Disposition: A | Discharge status: Home / Self Care | Payer: 59 | Source: Ambulatory Visit | Attending: Neurology | Admitting: Neurology

## 2018-01-05 DIAGNOSIS — G35 Multiple sclerosis: Secondary | ICD-10-CM

## 2018-01-05 MED ORDER — GADOBENATE DIMEGLUMINE 529 MG/ML IV SOLN
19.0000 mL | Freq: Once | INTRAVENOUS | Status: AC | PRN
  Administered 2018-01-05: 19 mL via INTRAVENOUS

## 2018-01-05 NOTE — Telephone Encounter (Signed)
Spoke to patient - she is aware of her MRI results and verbalized understanding. 

## 2018-01-05 NOTE — Telephone Encounter (Signed)
-----   Message from Asa Lente, MD sent at 01/05/2018  3:38 PM EDT ----- Please let her know that the MRI of the brain did not show any new lesions compared to the MRI 2 years ago.

## 2018-01-15 ENCOUNTER — Other Ambulatory Visit: Payer: Self-pay | Admitting: Neurology

## 2018-01-26 ENCOUNTER — Emergency Department (HOSPITAL_COMMUNITY)
Admission: EM | Admit: 2018-01-26 | Discharge: 2018-01-26 | Disposition: A | Discharge status: Home / Self Care | Payer: 59 | Attending: Emergency Medicine | Admitting: Emergency Medicine

## 2018-01-26 ENCOUNTER — Other Ambulatory Visit: Payer: Self-pay

## 2018-01-26 ENCOUNTER — Telehealth: Payer: Self-pay | Admitting: Neurology

## 2018-01-26 ENCOUNTER — Encounter (HOSPITAL_COMMUNITY): Payer: Self-pay

## 2018-01-26 DIAGNOSIS — R110 Nausea: Secondary | ICD-10-CM | POA: Insufficient documentation

## 2018-01-26 DIAGNOSIS — F17210 Nicotine dependence, cigarettes, uncomplicated: Secondary | ICD-10-CM | POA: Insufficient documentation

## 2018-01-26 DIAGNOSIS — M25561 Pain in right knee: Secondary | ICD-10-CM | POA: Insufficient documentation

## 2018-01-26 DIAGNOSIS — R42 Dizziness and giddiness: Secondary | ICD-10-CM | POA: Diagnosis not present

## 2018-01-26 DIAGNOSIS — M25511 Pain in right shoulder: Secondary | ICD-10-CM | POA: Insufficient documentation

## 2018-01-26 DIAGNOSIS — G35 Multiple sclerosis: Secondary | ICD-10-CM | POA: Diagnosis not present

## 2018-01-26 DIAGNOSIS — Z79899 Other long term (current) drug therapy: Secondary | ICD-10-CM | POA: Insufficient documentation

## 2018-01-26 DIAGNOSIS — M25551 Pain in right hip: Secondary | ICD-10-CM | POA: Diagnosis not present

## 2018-01-26 DIAGNOSIS — E039 Hypothyroidism, unspecified: Secondary | ICD-10-CM | POA: Insufficient documentation

## 2018-01-26 HISTORY — DX: Multiple sclerosis: G35

## 2018-01-26 MED ORDER — HYDROCODONE-ACETAMINOPHEN 5-325 MG PO TABS: 1.0000 | ORAL_TABLET | Freq: Four times a day (QID) | ORAL | 0 refills | Status: DC | PRN

## 2018-01-26 MED ORDER — KETOROLAC TROMETHAMINE 30 MG/ML IJ SOLN
30.0000 mg | Freq: Once | INTRAMUSCULAR | Status: AC
  Administered 2018-01-26: 30 mg via INTRAMUSCULAR
  Filled 2018-01-26: qty 1

## 2018-01-26 MED ORDER — PREDNISONE 20 MG PO TABS
60.0000 mg | ORAL_TABLET | Freq: Once | ORAL | Status: AC
  Administered 2018-01-26: 60 mg via ORAL
  Filled 2018-01-26: qty 3

## 2018-01-26 MED ORDER — PREDNISONE 20 MG PO TABS: ORAL_TABLET | ORAL | 0 refills | Status: DC

## 2018-01-26 MED ORDER — INDOMETHACIN 25 MG PO CAPS: 25.0000 mg | ORAL_CAPSULE | Freq: Three times a day (TID) | ORAL | 0 refills | Status: DC

## 2018-01-26 NOTE — Telephone Encounter (Addendum)
I called patient back.  She is in a lot of pain from her shoulders to lower back to hip to knees on right side. She was a little sore yesterday. This morning it worsened. She cannot move hardly.  Denies starting any other new medication recently. She c/o dizziness, nausea from dizziness. Weakness in legs. Denies any numbness or tingling.  I placed her on hold and spoke with Dr. Epimenio Foot. He recommends she proceed to ED to be evaluated. I relayed this to her. She verbalized understanding. She states her husband should be home soon and will take her to ED. She is planing on going to [place] long. Advised her to keep up updated and she can always f/u with [place] after she is seen.

## 2018-01-26 NOTE — Telephone Encounter (Signed)
Pt called stating she is in intense pain, starting in her right shoulder going down in her back and throughout her knee. Stating the pain is only on the right side. Pt also stated that she will most likely go to the ED as soon as her husband gets home from work

## 2018-01-26 NOTE — Discharge Instructions (Addendum)
Your shoulder, hip, knee pain may be related to your multiple sclerosis.  Take medications prescribed and follow up closely with your neurologist for further care. Return if you have any concerns.

## 2018-01-26 NOTE — ED Provider Notes (Signed)
[facility]-EMERGENCY DEPT Provider Note   CSN: [id number] Arrival date & time: 01/26/18  1806     History   Chief Complaint Chief Complaint  Patient presents with  . Right Sided Body Pain    HPI Taylor Donovan is a 27 y.o. female.  The history is provided by the patient and medical records. No language interpreter was used.     27 year old female with history of MS presenting complaining of body aches.  Patient reports since last night she developed pain primarily to her right shoulder, right hip and right knee.  Pain is sharp, throbbing, 9 out of 10, persistent, worsening with movement. She report feeling nausea and dizzy when the pain is intense.  No associated fever, chills, focal numbness or weakness.  No chest pain or shortness of breath.  Pain not adequately improved with taking ibuprofen.  She mentioned her hip and knee pain felt similar to prior pain that she had but the shoulder pain is new.  She denies any recent strenuous activities or heavy lifting.  She denies any vision changes.  Her neurologist is Dr. Epimenio Foot.  Pt recently had brain MRI on 01/05/2018 which shows no new lesions compared to the MRI 2 years ago. T2/FLAIR hypertense foci in the middle cerebellar peduncles and cerebral hemispheres consistent with chronic demyelinating plaque associated with MS.      Past Medical History:  Diagnosis Date  . Acid reflux   . Depression   . Hypothyroid   . Multiple sclerosis (HCC)   . Ovarian cyst   . PCOS (polycystic ovarian syndrome)   . Thyroid disease   . Vision abnormalities     Patient Active Problem List   Diagnosis Date Noted  . Right shoulder pain 12/16/2017  . Multiple joint pain 08/19/2017  . Other fatigue 06/10/2017  . Depression 06/10/2017  . Bilateral hip bursitis 12/10/2016  . BMI 35.0-35.9,adult 12/10/2016  . Insomnia 06/10/2016  . Urinary urgency 06/10/2016  . Multiple sclerosis (HCC) 01/29/2016  . Ataxic gait 01/29/2016    . 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 Vision loss of left eye 01/08/2016  . Abnormal brain MRI 01/08/2016  . Hypothyroidism 01/08/2016  . Vision loss, left eye 01/08/2016  . HASHIMOTO'S THYROIDITIS 08/13/2009  . DYSPNEA ON EXERTION 08/13/2009    History reviewed. No pertinent surgical history.   OB History   None      Home Medications    Prior to Admission medications   Medication Sig Start Date End Date Taking? Authorizing Provider  baclofen (LIORESAL) 10 MG tablet Take 1 tablet (10 mg total) by mouth 3 (three) times daily. 06/10/17   Sater, Pearletha Furl, MD  diphenhydrAMINE (BENADRYL) 25 mg capsule Take 50 mg by mouth at bedtime as needed for sleep.    [provider]  escitalopram (LEXAPRO) 20 MG tablet TAKE 1 TABLET BY MOUTH EVERY DAY 11/04/17   Sater, Pearletha Furl, MD  gabapentin (NEURONTIN) 300 MG capsule Take 3 capsules (900 mg total) by mouth at bedtime. 07/06/17   Sater, Pearletha Furl, MD  indomethacin (INDOCIN) 25 MG capsule TAKE 1 CAPSULE (25 MG TOTAL) BY MOUTH 3 (THREE) TIMES DAILY WITH MEALS. 09/15/17   Sater, Pearletha Furl, MD  levonorgestrel-ethinyl estradiol (ORSYTHIA) 0.1-20 MG-MCG tablet Take 1 tablet by mouth daily.    [provider]  oxybutynin (DITROPAN) 5 MG tablet TAKE 1 TABLET TWICE A DAY 07/08/17   Sater, Pearletha Furl, MD  phentermine 37.5 MG capsule Take 1 capsule (37.5 mg total) by  mouth every morning. 12/16/17   Sater, Pearletha Furl, MD  SYNTHROID 125 MCG tablet TAKE 1 TABLET ON AN EMPTY STOMACH IN THE MORNING ONCE A DAY ORALLY 30 DAY(S) 05/10/16   [provider]  TECFIDERA 240 MG CPDR TAKE 240 MG (1 CAPSULE) BY MOUTH TWICE A DAY 01/15/18   Sater, Pearletha Furl, MD  traZODone (DESYREL) 100 MG tablet Take 1 tablet (100 mg total) by mouth at bedtime. Take 1/2 to 1 pill at night 12/16/17   Sater, Pearletha Furl, MD    Family History Family History  Problem Relation Age of Onset  . Hypertension Mother   . Diabetes Mellitus II Mother   . Healthy Father     Social  History Social History   Tobacco Use  . Smoking status: Current Every Day Smoker    Packs/day: 0.50    Years: 8.00    Pack years: 4.00    Types: Cigarettes  . Smokeless tobacco: Never Used  Substance Use Topics  . Alcohol use: Yes    Comment: occasionally  . Drug use: No     Allergies   Patient has no known allergies.   Review of Systems Review of Systems  All other systems reviewed and are negative.    Physical Exam Updated Vital Signs BP (!) 144/98 (BP Location: Left Arm)   Pulse 98   Temp 98.5 F (36.9 C) (Oral)   Resp 14   Ht 5\' 7"  (1.702 m)   Wt 88.5 kg   SpO2 97%   BMI 30.54 kg/m   Physical Exam  Constitutional: She is oriented to person, place, and time. She appears well-developed and well-nourished. No distress.  HENT:  Head: Atraumatic.  Eyes: Conjunctivae are normal.  Neck: Neck supple.  Cardiovascular: Normal rate, regular rhythm and intact distal pulses.  Pulmonary/Chest: Effort normal and breath sounds normal.  Abdominal: Soft. She exhibits no distension. There is no tenderness.  Musculoskeletal: She exhibits tenderness (Mild tenderness to right shoulder on palpation with normal shoulder range of motion.  Mild tenderness to right hip and right knee with normal range of motion and no overlying skin changes.). She exhibits no edema.  5/5 strength to all 4 extremities.  Neurological: She is alert and oriented to person, place, and time. She has normal strength. No cranial nerve deficit or sensory deficit. GCS eye subscore is 4. GCS verbal subscore is 5. GCS motor subscore is 6.  Skin: No rash noted.  Psychiatric: She has a normal mood and affect.  Nursing note and vitals reviewed.    ED Treatments / Results  Labs (all labs ordered are listed, but only abnormal results are displayed) Labs Reviewed - No data to display  EKG None  Radiology No results found.  Procedures Procedures (including critical care time)  Medications Ordered in  ED Medications  ketorolac (TORADOL) 30 MG/ML injection 30 mg (has no administration in time range)  predniSONE (DELTASONE) tablet 60 mg (has no administration in time range)     Initial Impression / Assessment and Plan / ED Course  I have reviewed the triage vital signs and the nursing notes.  Pertinent labs & imaging results that were available during my care of the patient were reviewed by me and considered in my medical decision making (see chart for details).     BP 126/84   Pulse (!) 59   Temp 98.5 F (36.9 C) (Oral)   Resp 16   Ht 5\' 7"  (1.702 m)   Wt 88.5 kg  SpO2 97%   BMI 30.54 kg/m    Final Clinical Impressions(s) / ED Diagnoses   Final diagnoses:  Pain in joint of right shoulder  Pain, joint, hip, right  Pain, joint, knee, right    ED Discharge Orders         Ordered    indomethacin (INDOCIN) 25 MG capsule  3 times daily with meals     01/26/18 2338    predniSONE (DELTASONE) 20 MG tablet     01/26/18 2338    HYDROcodone-acetaminophen (NORCO/VICODIN) 5-325 MG tablet  Every 6 hours PRN     01/26/18 2338         10:54 PM Patient here with pain primarily to the right side of her body including her right shoulder, hip, and knee.  She maintained full range of motion and no overlying skin changes.  No significant weakness or numbness appreciated.  She is well-appearing. Recent brain MRI on 01/05/18 without new changes.  No focal neuro deficit on today's exam.  I discussed care with Dr. Denton Lank.  Plan to d/c with steroid dose pak, pain medication and muscle relaxant.  Pt to f/u with her neurologist for further care, return precaution given.  In order to decrease risk of narcotic abuse. Pt's record were checked using the [place] Controlled Substance database.     Fayrene Helper, PA-C 01/26/18 Adin Hector    Cathren Laine, MD 01/27/18 [phone]

## 2018-01-26 NOTE — ED Triage Notes (Signed)
Pt has hx of MS and pt complains of increased right sided body pain. Pt reports nausea and fatigue.

## 2018-01-27 NOTE — Telephone Encounter (Signed)
Per Dr. Epimenio Foot- he would like her to try the medication the ED provided her. Continue them through the weekend. If she does not see any improvement she should contact us on Monday.

## 2018-01-27 NOTE — Telephone Encounter (Signed)
Pt requesting a call to update RN on ED visit. Stating she did not wish to go into further detail with me at the time

## 2018-01-27 NOTE — Telephone Encounter (Signed)
Called and spoke with pt. Relayed Dr. Epimenio Foot recommendations. She verbalized understanding and appreciation.

## 2018-01-27 NOTE — Telephone Encounter (Signed)
I called patient back. Her pain has improved and she is feeling better today. She is still having some pain. ED provided her with the following: hydrocodone-acetaminophen 5-325mg  #15 (1-2 tabs po q6hr prn), indomethacin 25mg  #30 (1 cap TID), and prednisone 20mg  tab (3tabs on day 1 and 2tabs daily for 4 days until finished).   She has not picked up prescriptions yet. She is wanting to know if Dr. Epimenio Foot would like her to start these medications and see if they help or come in for a f/u. Advised I will call her back to let her know.

## 2018-04-01 ENCOUNTER — Telehealth: Payer: Self-pay | Admitting: Neurology

## 2018-04-01 MED ORDER — TECFIDERA 240 MG PO CPDR: DELAYED_RELEASE_CAPSULE | ORAL | 12 refills | Status: DC

## 2018-04-01 NOTE — Telephone Encounter (Signed)
Called pt and verified she wants rx sent to [facility] specialty pharmacy. I e-scribed rx for her.

## 2018-04-01 NOTE — Telephone Encounter (Signed)
Patient called and requested a refill on her rx Tecfidera to be sent to [facility] the number is [phone].

## 2018-04-30 ENCOUNTER — Telehealth: Payer: Self-pay | Admitting: *Deleted

## 2018-04-30 NOTE — Telephone Encounter (Signed)
Initiated PA Tecfidera on covermymeds. Key: [id number]. In process of completing

## 2018-04-30 NOTE — Telephone Encounter (Signed)
PA approved effective 04/30/2018 - 05/01/2019. PA# [employer] Plan [id number]

## 2018-04-30 NOTE — Telephone Encounter (Signed)
Submitted PA. Waiting on determination from [place].   "Your information has been submitted to [employer].  If [employer] has not responded to your request within 24 hours, contact [employer] at [phone]."

## 2018-05-26 ENCOUNTER — Other Ambulatory Visit: Payer: Self-pay | Admitting: Neurology

## 2018-06-07 ENCOUNTER — Telehealth: Payer: Self-pay | Admitting: Neurology

## 2018-06-07 NOTE — Telephone Encounter (Signed)
Pt states through her mail order she still has not received her TECFIDERA 240 MG CPDR.  Pt states she hasn't had the medication for about a week, pt is asking for a call from RN as to what she should do

## 2018-06-07 NOTE — Telephone Encounter (Signed)
I called and spoke with Taylor Donovan from [facility] specialty pharmacy at [phone]. She advised they have been trying to reach pt since 03/2018 when rx refill sent and have not heard back. I called pt and advised her to call [facility] specialty pharmacy back before 4pm today and ask them to expedite shipment. She should then receive rx by tomorrow. Pt verbalized understanding and appreciation.

## 2018-06-16 ENCOUNTER — Ambulatory Visit (INDEPENDENT_AMBULATORY_CARE_PROVIDER_SITE_OTHER): Payer: 59 | Admitting: Neurology

## 2018-06-16 ENCOUNTER — Other Ambulatory Visit: Payer: Self-pay

## 2018-06-16 ENCOUNTER — Encounter: Payer: Self-pay | Admitting: Neurology

## 2018-06-16 VITALS — BP 125/80 | HR 92 | Ht 67.0 in | Wt 211.0 lb

## 2018-06-16 DIAGNOSIS — M7062 Trochanteric bursitis, left hip: Secondary | ICD-10-CM

## 2018-06-16 DIAGNOSIS — G4700 Insomnia, unspecified: Secondary | ICD-10-CM

## 2018-06-16 DIAGNOSIS — M7061 Trochanteric bursitis, right hip: Secondary | ICD-10-CM

## 2018-06-16 DIAGNOSIS — R3915 Urgency of urination: Secondary | ICD-10-CM

## 2018-06-16 DIAGNOSIS — G35 Multiple sclerosis: Secondary | ICD-10-CM | POA: Diagnosis not present

## 2018-06-16 DIAGNOSIS — G35D Multiple sclerosis, unspecified: Secondary | ICD-10-CM

## 2018-06-16 DIAGNOSIS — R5383 Other fatigue: Secondary | ICD-10-CM

## 2018-06-16 DIAGNOSIS — R260 Ataxic gait: Secondary | ICD-10-CM

## 2018-06-16 MED ORDER — OXYBUTYNIN CHLORIDE 5 MG PO TABS: 5.0000 mg | ORAL_TABLET | Freq: Two times a day (BID) | ORAL | 3 refills | Status: DC

## 2018-06-16 MED ORDER — BACLOFEN 10 MG PO TABS: 10.0000 mg | ORAL_TABLET | Freq: Three times a day (TID) | ORAL | 3 refills | Status: DC

## 2018-06-16 MED ORDER — GABAPENTIN 300 MG PO CAPS: 900.0000 mg | ORAL_CAPSULE | Freq: Every day | ORAL | 3 refills | Status: DC

## 2018-06-16 MED ORDER — PHENTERMINE HCL 37.5 MG PO CAPS: 37.5000 mg | ORAL_CAPSULE | ORAL | 1 refills | Status: DC

## 2018-06-16 MED ORDER — TRAMADOL HCL 50 MG PO TABS: 50.0000 mg | ORAL_TABLET | Freq: Every day | ORAL | 1 refills | Status: DC

## 2018-06-16 NOTE — Progress Notes (Signed)
[facility]  PATIENT: Taylor Donovan DOB: 1991/02/27  REFERRING DOCTOR OR PCP:  Vicenta Aly, FNP SOURCE: patient, notes from ED/hospital stay, lab results, imaging results, MRI images on PACS  _________________________________   HISTORICAL  CHIEF COMPLAINT:  Chief Complaint  Patient presents with   Follow-up    RM 12, alone. Last seen 12/16/2017. Overall, doing ok. She is more stiff in her neck, hip/back pain. Sometimes she is limping while she walks. Takes baclofen/gabapentin at night which helps some. Hard for her to get comfortable.    Multiple Sclerosis    On tecfidera, tolerating.     HISTORY OF PRESENT ILLNESS:  Taylor Donovan is a 28 y.o. woman with MS reporting new left sided visual changes  Update 06/16/2018: She is doing well on Tecfidera and has no exacerbations recently or new symptoms.     Her gait is doing well.    She feels clumsier but just a little worse.    She sometimes hits the walls or door jambs.     She occ trips.   Strength and spasticity are doing well.   Neck, hips and lower back are hurting a little more.    She also has some neck pain and stiffness.   She is on gabapentin and baclofen.    She does not take NSAIDs regularly but will occ take 800 mg Ibu.    She notes some numbness in hands and feet.     Bladder urgency is mildly worse.    Vision is doing well though she has some light sensitivity.    Fatigue occurs daily and is just a little better after leaving work.   She has insomnia and trazodone helps slightly but if in more pain does worse.      The subacromial bursa injection helped a lot.    Update 12/16/2017: She feels her MS has been stable for the most part and there are no exacerbations.   She is tolerating the Tecfidera well and currently has no side effects.,    She denies difficulty with her gait and no weakness.   She has some numbness in her feet, bilateral.  This is most noticeable with driving, even short  distances.   She is on gabapentin and that helps some.   She takes 900 mg nightly.    She has difficulty falling asleep and staying asleep.   Sometimes she has trouble getting comfortable and other times she is not sure why she has insomnia.   She is still on baclofen 10 mg po bid.    She notes some fatigue, worse if she slept poorly.  Fatigue is better since changing jobs.   She was on phentermine and felt it helped her a little bit.   Taking a nap helps.  She does not snore.      Mood is doing well.   She notes some difficulty with her short term memory and focus/attention.     The right shoulder is more painful and ROM is reduced, especially elevation.   Update 08/19/2017: She had the onset of laryngitis about 3 weeks.   She was tested for flu but not for strep throat.  She was placed on Amoxicillin.    She felt her throat infection was improving last week.   She went back to work over the weekend.      She was fine Saturday but on Sunday, as the evening went on, she had much more pain, mostly in the ankles  and knees.   She also noted more tingling in her toes and a tremor.     She notes that increasing the gabapentin helped the tingling in the feet but not the leg pain.   The baclofen is helping her muscle spasms and also helping sleep.     No definite exacerbation.   She tolerates Tecfidera well.    Update 06/10/2017: She feels her MS is stable,     She is on Tecfidera and she tolerates it well.   She denies exacerbations.    No abdominal symptoms or flushing.     She has more tenderness in her legs, right > left.   She notes some stiffness/spasticity.  Pain is achy.   She notes it more when she is not using.   It sometimes bothers her at night.    Flexeril at night did not help much.   She has not tried baclofen.    She is walking well and denies weakness.    She was having groin numbness and pain, helped by lamotrigine.    She started doing better and stopped the lamotrigine.   She is still on  gabapentin.    Bladder is doing well on oxybutynin.         She has fatigue. She sometimes works 11-12 hours.    At first phentermine was helping but she felt it helped less after a few months.   Mood is doing better on escitalopram.     From 12/10/2016: Groin pain is better since starting lamotrigine.     However, her hips and lower back are hurting more.   Pain fluctuates but is not necessarily worse with changes in position.     LBP does not radiate into the legs.  She is noting some verbal fluency issues and STM seems a little worse.  Attention and focus seem worse.    She is often sleepy and notes fatigue and sometimes takes naps.    She is waking up more at night, sometimes to urinate and sometimes randomly.     After the left optic neuritis in March, vision improved again.   Colors are washed out a bit.     She was having some allergies affecting her eyes and she was advised to take a Benadryl at bedtime.     __________________   She is tolerating Tecfidera well and she denies any exacerbation.     About 10 days ago, she had the onset of left visual blurriness associated with left eye pain.    Pain worseend with moving her eyes.   She had flu-like symptoms and neck stiffness with her IV steroid treatment.    She had similar but milder symptoms the first time she was on IV Steroids.   Etodolac and Flexeril helped her myalgias.     MS:   She did not qualify for a drug study and we have had trouble getting her started on Tecfidera.   She does not feel she could self inject and so can not be on interferon or glatiramer.  We were finally able to get approval for Tecfidera and she should be getting her first shipment within a week.    Gait/strength/sensation: She feels mildly clumsy with her gait but has no falls. 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  . She denies any significant weakness or numbness in the arms or  legs.  Bladder/bowel: She notes urinary frequency with  extreme urgency a times and has nocturia twice most nights.  She has rare incontinence when she can't get to bathroom in time (happened once at work).   She notes some constipation.  Fatigue/sleep: She notes fatigue on a daily basis that is worse as the day goes on. She always feels sleepy. She falls asleep easily and will get a couple hours of sleep. However, once she wakes up she has difficulty falling back asleep. This has been present the last year. He did worse while on steroids.   She denies RLS.    Mood/Cognition: She denies much depression or anxiety. She has no major cognitive issues but she has had some problems with verbal fluency. The difficulties with verbal fluency began this year.  MS History:   She presented with left optic neuritis in October 2017.   MRI of the brain showed several other foci, worrisome for multiple sclerosis. One of the foci is in the right middle cerebellar peduncle and a couple of the foci are periventricular. I personally reviewed the MRI of the brain dated 01/07/2016. There are several T2/FLAIR hyperintense foci, one is in the right middle cerebellar peduncle and a couple are in the periventricular white matter. Other foci are in the subcortical deep white matter. None of the foci enhanced after contrast admission to the hospital.   I also reviewed the MRI of the orbits performed that day. That MRI was normal.   I reviewed the admission and discharge notes from her emergency room visit and hospital stay this month.   TSH was elevated and B12 was reduced.   ESR, CRP, RPR, Lyme Abs/DNA, ANA were negative.     REVIEW OF SYSTEMS: Constitutional: No fevers, chills, sweats, or change in appetite. She notes fatigue and some sleepiness. Eyes: as above Ear, nose and throat: No hearing loss, ear pain, nasal congestion, sore throat Cardiovascular: No chest pain, palpitations Respiratory: No shortness of breath at rest  or with exertion.   No wheezes.   She snores. GastrointestinaI: No nausea, vomiting, diarrhea, abdominal pain, fecal incontinence Genitourinary: No dysuria, urinary retention or frequency.  2 x nocturia. Musculoskeletal: No neck pain, back pain Integumentary: No rash, pruritus, skin lesions Neurological: as above Psychiatric: Notes some depression better on Lexapro.  No anxiety Endocrine: No palpitations, diaphoresis, change in appetite, change in weigh or increased thirst Hematologic/Lymphatic: No anemia, purpura, petechiae. Allergic/Immunologic: No itchy/runny eyes, nasal congestion, recent allergic reactions, rashes  ALLERGIES: No Known Allergies  HOME MEDICATIONS:  Current Outpatient Medications:    baclofen (LIORESAL) 10 MG tablet, Take 1 tablet (10 mg total) by mouth 3 (three) times daily., Disp: 270 each, Rfl: 3   cholecalciferol (VITAMIN D) 1000 units tablet, Take 5,000 Units by mouth daily., Disp: , Rfl:    diclofenac sodium (VOLTAREN) 1 % GEL, Apply 2 g topically 2 (two) times daily., Disp: , Rfl:    diphenhydrAMINE (BENADRYL) 25 mg capsule, Take 50 mg by mouth at bedtime as needed for sleep., Disp: , Rfl:    escitalopram (LEXAPRO) 20 MG tablet, TAKE 1 TABLET BY MOUTH EVERY DAY, Disp: 30 tablet, Rfl: 5   gabapentin (NEURONTIN) 300 MG capsule, Take 3 capsules (900 mg total) by mouth at bedtime., Disp: 270 capsule, Rfl: 3   levonorgestrel-ethinyl estradiol (ORSYTHIA) 0.1-20 MG-MCG tablet, Take 1 tablet by mouth daily., Disp: , Rfl:    oxybutynin (DITROPAN) 5 MG tablet, Take 1 tablet (5 mg total) by mouth 2 (two) times daily., Disp: 180 tablet, Rfl: 3  phentermine 37.5 MG capsule, Take 1 capsule (37.5 mg total) by mouth every morning., Disp: 90 capsule, Rfl: 1   SYNTHROID 125 MCG tablet, Take 125 mcg by mouth daily. , Disp: , Rfl: 5   TECFIDERA 240 MG CPDR, TAKE 240 MG (1 CAPSULE) BY MOUTH TWICE A DAY, Disp: 180 capsule, Rfl: 12   traZODone (DESYREL) 100 MG tablet,  Take 1 tablet (100 mg total) by mouth at bedtime. Take 1/2 to 1 pill at night, Disp: 90 tablet, Rfl: 3   traMADol (ULTRAM) 50 MG tablet, Take 1 tablet (50 mg total) by mouth at bedtime., Disp: 90 tablet, Rfl: 1  PAST MEDICAL HISTORY: Past Medical History:  Diagnosis Date   Acid reflux    Depression    Hypothyroid    Multiple sclerosis (HCC)    Ovarian cyst    PCOS (polycystic ovarian syndrome)    Thyroid disease    Vision abnormalities     PAST SURGICAL HISTORY: History reviewed. No pertinent surgical history.  FAMILY HISTORY: Family History  Problem Relation Age of Onset   Hypertension Mother    Diabetes Mellitus II Mother    Healthy Father     SOCIAL HISTORY:  Social History   Socioeconomic History   Marital status: Single    Spouse name: Not on file   Number of children: Not on file   Years of education: Not on file   Highest education level: Not on file  Occupational History   Not on file  Social Needs   Financial resource strain: Not on file   Food insecurity:    Worry: Not on file    Inability: Not on file   Transportation needs:    Medical: Not on file    Non-medical: Not on file  Tobacco Use   Smoking status: Current Every Day Smoker    Packs/day: 0.50    Years: 8.00    Pack years: 4.00    Types: Cigarettes   Smokeless tobacco: Never Used  Substance and Sexual Activity   Alcohol use: Yes    Comment: occasionally   Drug use: No   Sexual activity: Yes    Birth control/protection: Pill  Lifestyle   Physical activity:    Days per week: Not on file    Minutes per session: Not on file   Stress: Not on file  Relationships   Social connections:    Talks on phone: Not on file    Gets together: Not on file    Attends religious service: Not on file    Active member of club or organization: Not on file    Attends meetings of clubs or organizations: Not on file    Relationship status: Not on file   Intimate partner  violence:    Fear of current or ex partner: Not on file    Emotionally abused: Not on file    Physically abused: Not on file    Forced sexual activity: Not on file  Other Topics Concern   Not on file  Social History Narrative   Not on file     PHYSICAL EXAM  Vitals:   06/16/18 1435  BP: 125/80  Pulse: 92  SpO2: 94%  Weight: 211 lb (95.7 kg)  Height: '5\' 7"'$  (1.702 m)    Body mass index is 33.05 kg/m.   General: The patient is well-developed and well-nourished and in no acute distress.   Musculoskeletal: She has tenderness over the trochanteric bursae Neurologic Exam  Mental status: The  patient is alert and oriented x 3 at the time of the examination. The patient has apparent normal recent and remote memory, with an apparently normal attention span and concentration ability.   Speech is normal.  Cranial nerves: Extraocular movements are full. Reduced left color vision on the left   Facial strength and sensation is normal. Trapezius strength is normal. No dysarthria is noted.  The tongue is midline, and the patient has symmetric elevation of the soft palate. No obvious hearing deficits are noted.  Motor:  Muscle bulk is normal.   Muscle tone and strength are fine.  Sensory: She reported symmetric touch and vibration sensation in the arms and legs.  Coordination: Cerebellar testing shows good finger-nose-finger and heel-to-shin bilaterally..  Gait and station: Station is normal.   Gait is normal. Her tandem gait is mildly wide. Romberg is negative.   Reflexes: Deep tendon reflexes are symmetric and normal bilaterally.       DIAGNOSTIC DATA (LABS, IMAGING, TESTING) - I reviewed patient records, labs, notes, testing and imaging myself where available.  Lab Results  Component Value Date   WBC 5.8 12/16/2017   HGB 12.8 12/16/2017   HCT 39.0 12/16/2017   MCV 90 12/16/2017   PLT 245 12/16/2017      Component Value Date/Time   NA 138 03/15/2017 1942   K 4.0  03/15/2017 1942   CL 107 03/15/2017 1942   CO2 27 03/15/2017 1942   GLUCOSE 79 03/15/2017 1942   BUN 10 03/15/2017 1942   CREATININE 0.67 03/15/2017 1942   CALCIUM 8.9 03/15/2017 1942   PROT 7.1 03/15/2017 1942   ALBUMIN 4.2 03/15/2017 1942   AST 15 03/15/2017 1942   ALT 15 03/15/2017 1942   ALKPHOS 60 03/15/2017 1942   BILITOT 0.5 03/15/2017 1942   GFRNONAA >60 03/15/2017 1942   GFRAA >60 03/15/2017 1942   No results found for: CHOL, HDL, LDLCALC, LDLDIRECT, TRIG, CHOLHDL      ASSESSMENT AND PLAN  Multiple sclerosis (HCC)  Trochanteric bursitis of both hips  Insomnia, unspecified type  Urinary urgency  Other fatigue  Ataxic gait    1.     Continue Tecfidera 240 mg twice a day.  Lymphocytes were fine recently checked.   Check CBC with diff 2.    Tramadol qHS for pain at night which should help  3.    retry Phentermine 37.5 mg daily for obesity and reduced attention and focus and fatigue  4.   Baclofen for spasms/discomfort 5.   Return in 5-6 months or sooner if there are new or worsening neurologic symptoms.   Ramata Strothman A. Felecia Shelling, MD, PhD 11/27/5619, 3:08 PM Certified in Neurology, Clinical Neurophysiology, Sleep Medicine, Pain Medicine and Neuroimaging  [facility] [address] [place], [place] [address] [phone]

## 2018-06-17 ENCOUNTER — Telehealth: Payer: Self-pay | Admitting: *Deleted

## 2018-06-17 LAB — CBC WITH DIFFERENTIAL/PLATELET
BASOS: 1 %
Basophils Absolute: 0 10*3/uL (ref 0.0–0.2)
EOS (ABSOLUTE): 0.2 10*3/uL (ref 0.0–0.4)
Eos: 3 %
Hematocrit: 37 % (ref 34.0–46.6)
Hemoglobin: 12.6 g/dL (ref 11.1–15.9)
IMMATURE GRANULOCYTES: 0 %
Immature Grans (Abs): 0 10*3/uL (ref 0.0–0.1)
Lymphocytes Absolute: 0.7 10*3/uL (ref 0.7–3.1)
Lymphs: 13 %
MCH: 29.4 pg (ref 26.6–33.0)
MCHC: 34.1 g/dL (ref 31.5–35.7)
MCV: 86 fL (ref 79–97)
MONOS ABS: 0.4 10*3/uL (ref 0.1–0.9)
Monocytes: 8 %
NEUTROS ABS: 4 10*3/uL (ref 1.4–7.0)
NEUTROS PCT: 75 %
PLATELETS: 260 10*3/uL (ref 150–450)
RBC: 4.28 x10E6/uL (ref 3.77–5.28)
RDW: 12.3 % (ref 11.7–15.4)
WBC: 5.3 10*3/uL (ref 3.4–10.8)

## 2018-06-17 NOTE — Telephone Encounter (Signed)
I called and spoke with pt about lab results. Pt verbalized understanding.

## 2018-06-17 NOTE — Telephone Encounter (Signed)
-----   Message from Asa Lente, MD sent at 06/17/2018  2:47 PM EDT ----- Please let the patient know that the lab work is fine.

## 2018-06-22 ENCOUNTER — Other Ambulatory Visit: Payer: Self-pay | Admitting: Neurology

## 2018-06-29 ENCOUNTER — Other Ambulatory Visit: Payer: Self-pay | Admitting: Neurology

## 2018-09-22 ENCOUNTER — Telehealth: Payer: Self-pay | Admitting: Neurology

## 2018-09-22 NOTE — Telephone Encounter (Signed)
Pt called in stating her [place] has ran out and the copay is around 4,000 and wants to know if there is something that can be done so she can afford it

## 2018-09-22 NOTE — Telephone Encounter (Signed)
Called [employer] support, [phone], spoke with Solmon Ice and inquired if she knew of patient no longer having assistance. Solmon Ice stated she is in their Co pay assistance program until 2048. She advised they got a call on 09/14/18 from [place] because of getting a rejection for the medication.  The issue turned out to be that she had a different last name with [facility] than her name with [employer]. Felicia advised to call her and see if she has received shipment. For any problems with getting her medication patient is to call [place] at [phone]. Called patient who stated she has not gotten tecfidera in the past month. She stated she is not about to run out. She stated [place] has called her and told her she is not enrolled in Patient co pay program. I advised her I;ll call pharmacy and let her know. Patient verbalized understanding, appreciation. Called pharmacy, spoke with Murray Hodgkins who stated her co pay assistance is no longer active; she is in the program but must re apply every year. Holland Falling spoke with her on 17th and told her she had to reapply for co pay assistance.  Called patient and advised she needs to call [employer] and reapply for co pay assistance; I gave her [place] number, advised she call back for any problems. Patient verbalized understanding, appreciation.

## 2018-09-27 ENCOUNTER — Telehealth: Payer: Self-pay | Admitting: Neurology

## 2018-09-27 NOTE — Telephone Encounter (Signed)
Pt states she is pregnant and that she was told to inform the provider right away if it were to happen. Please advise.

## 2018-09-27 NOTE — Telephone Encounter (Signed)
She is pregnant (she estimates 4 to 6 weeks) and has an appointment to see her gynecologist in 2 to 3 weeks.  I have advised her to go ahead and stop the Tecfidera and the phentermine and to reduce the baclofen to just 1 pill a day for now and to discuss her medications with her gynecologist to see if other changes are suggested

## 2018-10-11 DIAGNOSIS — E039 Hypothyroidism, unspecified: Secondary | ICD-10-CM | POA: Insufficient documentation

## 2018-10-11 DIAGNOSIS — O99280 Endocrine, nutritional and metabolic diseases complicating pregnancy, unspecified trimester: Secondary | ICD-10-CM | POA: Insufficient documentation

## 2018-11-02 DIAGNOSIS — O99330 Smoking (tobacco) complicating pregnancy, unspecified trimester: Secondary | ICD-10-CM | POA: Insufficient documentation

## 2018-11-02 DIAGNOSIS — Z8659 Personal history of other mental and behavioral disorders: Secondary | ICD-10-CM | POA: Insufficient documentation

## 2018-11-02 LAB — OB RESULTS CONSOLE GC/CHLAMYDIA
Chlamydia: NEGATIVE
Gonorrhea: NEGATIVE

## 2018-11-02 LAB — OB RESULTS CONSOLE ABO/RH: ABO/RH(D): NEGATIVE

## 2018-11-02 LAB — OB RESULTS CONSOLE RPR: RPR: NONREACTIVE

## 2018-11-02 LAB — OB RESULTS CONSOLE ANTIBODY SCREEN: Antibody Screen: NEGATIVE

## 2018-11-02 LAB — OB RESULTS CONSOLE RUBELLA ANTIBODY, IGM: Rubella: IMMUNE

## 2018-11-02 LAB — OB RESULTS CONSOLE HEPATITIS B SURFACE ANTIGEN: Hepatitis B Surface Ag: NEGATIVE

## 2018-11-02 LAB — OB RESULTS CONSOLE HIV ANTIBODY (ROUTINE TESTING): HIV: NONREACTIVE

## 2018-12-13 ENCOUNTER — Emergency Department (HOSPITAL_COMMUNITY)
Admission: EM | Admit: 2018-12-13 | Discharge: 2018-12-13 | Disposition: A | Discharge status: Home / Self Care | Payer: 59 | Attending: Emergency Medicine | Admitting: Emergency Medicine

## 2018-12-13 ENCOUNTER — Encounter (HOSPITAL_COMMUNITY): Payer: Self-pay | Admitting: Emergency Medicine

## 2018-12-13 ENCOUNTER — Other Ambulatory Visit: Payer: Self-pay

## 2018-12-13 DIAGNOSIS — Z3A15 15 weeks gestation of pregnancy: Secondary | ICD-10-CM | POA: Diagnosis not present

## 2018-12-13 DIAGNOSIS — O9A212 Injury, poisoning and certain other consequences of external causes complicating pregnancy, second trimester: Secondary | ICD-10-CM | POA: Insufficient documentation

## 2018-12-13 DIAGNOSIS — S39012A Strain of muscle, fascia and tendon of lower back, initial encounter: Secondary | ICD-10-CM | POA: Insufficient documentation

## 2018-12-13 DIAGNOSIS — O99332 Smoking (tobacco) complicating pregnancy, second trimester: Secondary | ICD-10-CM | POA: Insufficient documentation

## 2018-12-13 DIAGNOSIS — F17210 Nicotine dependence, cigarettes, uncomplicated: Secondary | ICD-10-CM | POA: Insufficient documentation

## 2018-12-13 DIAGNOSIS — O99282 Endocrine, nutritional and metabolic diseases complicating pregnancy, second trimester: Secondary | ICD-10-CM | POA: Insufficient documentation

## 2018-12-13 DIAGNOSIS — Y929 Unspecified place or not applicable: Secondary | ICD-10-CM | POA: Diagnosis not present

## 2018-12-13 DIAGNOSIS — X500XXA Overexertion from strenuous movement or load, initial encounter: Secondary | ICD-10-CM | POA: Diagnosis not present

## 2018-12-13 DIAGNOSIS — T148XXA Other injury of unspecified body region, initial encounter: Secondary | ICD-10-CM

## 2018-12-13 DIAGNOSIS — E039 Hypothyroidism, unspecified: Secondary | ICD-10-CM | POA: Diagnosis not present

## 2018-12-13 DIAGNOSIS — Z79899 Other long term (current) drug therapy: Secondary | ICD-10-CM | POA: Diagnosis not present

## 2018-12-13 DIAGNOSIS — Y9389 Activity, other specified: Secondary | ICD-10-CM | POA: Insufficient documentation

## 2018-12-13 DIAGNOSIS — Y999 Unspecified external cause status: Secondary | ICD-10-CM | POA: Insufficient documentation

## 2018-12-13 NOTE — Discharge Instructions (Addendum)
Use Tylenol as directed.  Follow-up with your doctor if worse

## 2018-12-13 NOTE — ED Notes (Signed)
ED Provider, Zenia Resides at bedside.

## 2018-12-13 NOTE — ED Provider Notes (Signed)
[facility]ENC[facility] Provider Note   CSN: [id number] Arrival date & time: 12/13/18  0730     History   Chief Complaint Chief Complaint  Patient presents with  . Back Pain    HPI Markus DaftShelbie Dotter is a 28 y.o. female.     28 year old female who is [redacted] weeks pregnant who presents with acute onset of lower back pain times several days after she was pushing a cart.  States that the pain is dull and worse with any movement.  No loss of bowel or bladder function.  Denies any urinary symptoms.  No vaginal bleeding or uterine cramping.  Pain unrelieved with Tylenol.  Denies any foot drop when walking.  Symptoms better with remaining still     Past Medical History:  Diagnosis Date  . Acid reflux   . Depression   . Hypothyroid   . Multiple sclerosis (HCC)   . Ovarian cyst   . PCOS (polycystic ovarian syndrome)   . Thyroid disease   . Vision abnormalities     Patient Active Problem List   Diagnosis Date Noted  . Right shoulder pain 12/16/2017  . Multiple joint pain 08/19/2017  . Other fatigue 06/10/2017  . Depression 06/10/2017  . Bilateral hip bursitis 12/10/2016  . BMI 35.0-35.9,adult 12/10/2016  . Insomnia 06/10/2016  . Urinary urgency 06/10/2016  . Multiple sclerosis (HCC) 01/29/2016  . 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 Vision loss of left eye 01/08/2016  . Abnormal brain MRI 01/08/2016  . Hypothyroidism 01/08/2016  . Vision loss, left eye 01/08/2016  . HASHIMOTO'S THYROIDITIS 08/13/2009  . DYSPNEA ON EXERTION 08/13/2009    No past surgical history on file.   OB History    Gravida  1   Para      Term      Preterm      AB      Living        SAB      TAB      Ectopic      Multiple      Live Births               Home Medications    Prior to Admission medications   Medication Sig Start Date End Date Taking? Authorizing Provider  baclofen (LIORESAL) 10 MG tablet Take 1 tablet (10 mg  total) by mouth 3 (three) times daily. 06/16/18   Sater, Pearletha Furlichard A, MD  cholecalciferol (VITAMIN D) 1000 units tablet Take 5,000 Units by mouth daily.    [provider]  diclofenac sodium (VOLTAREN) 1 % GEL Apply 2 g topically 2 (two) times daily.    [provider]  diphenhydrAMINE (BENADRYL) 25 mg capsule Take 50 mg by mouth at bedtime as needed for sleep.    [provider]  escitalopram (LEXAPRO) 20 MG tablet TAKE 1 TABLET BY MOUTH EVERY DAY 06/22/18   Sater, Pearletha Furlichard A, MD  gabapentin (NEURONTIN) 300 MG capsule Take 3 capsules (900 mg total) by mouth at bedtime. 06/16/18   Sater, Pearletha Furlichard A, MD  levonorgestrel-ethinyl estradiol (ORSYTHIA) 0.1-20 MG-MCG tablet Take 1 tablet by mouth daily.    [provider]  oxybutynin (DITROPAN) 5 MG tablet Take 1 tablet (5 mg total) by mouth 2 (two) times daily. 06/16/18   Sater, Pearletha Furlichard A, MD  phentermine 37.5 MG capsule Take 1 capsule (37.5 mg total) by mouth every morning. 06/16/18   Sater, Pearletha Furlichard A, MD  SYNTHROID 125 MCG tablet  Take 125 mcg by mouth daily.  05/10/16   [provider]  TECFIDERA 240 MG CPDR TAKE 240 MG (1 CAPSULE) BY MOUTH TWICE A DAY 04/01/18   Sater, Pearletha Furlichard A, MD  traMADol (ULTRAM) 50 MG tablet Take 1 tablet (50 mg total) by mouth at bedtime. 06/16/18   Sater, Pearletha Furlichard A, MD  traZODone (DESYREL) 100 MG tablet TAKE 1/2-1 TABLET (100 MG TOTAL) BY MOUTH AT BEDTIME. 06/29/18   Sater, Pearletha Furlichard A, MD    Family History Family History  Problem Relation Age of Onset  . Hypertension Mother   . Diabetes Mellitus II Mother   . Healthy Father     Social History Social History   Tobacco Use  . Smoking status: Current Every Day Smoker    Packs/day: 0.50    Years: 8.00    Pack years: 4.00    Types: Cigarettes  . Smokeless tobacco: Never Used  Substance Use Topics  . Alcohol use: Yes    Comment: occasionally  . Drug use: No     Allergies   Patient has no known allergies.   Review of Systems  Review of Systems  All other systems reviewed and are negative.    Physical Exam Updated Vital Signs BP 120/77 (BP Location: Right Arm)   Pulse 97   Temp 98.3 F (36.8 C) (Oral)   Resp 18   SpO2 97%   Physical Exam Vitals signs and nursing note reviewed.  Constitutional:      General: She is not in acute distress.    Appearance: Normal appearance. She is well-developed. She is not toxic-appearing.  HENT:     Head: Normocephalic and atraumatic.  Eyes:     General: Lids are normal.     Conjunctiva/sclera: Conjunctivae normal.     Pupils: Pupils are equal, round, and reactive to light.  Neck:     Musculoskeletal: Normal range of motion and neck supple.     Thyroid: No thyroid mass.     Trachea: No tracheal deviation.  Cardiovascular:     Rate and Rhythm: Normal rate and regular rhythm.     Heart sounds: Normal heart sounds. No murmur. No gallop.   Pulmonary:     Effort: Pulmonary effort is normal. No respiratory distress.     Breath sounds: Normal breath sounds. No stridor. No decreased breath sounds, wheezing, rhonchi or rales.  Abdominal:     General: Bowel sounds are normal. There is no distension.     Palpations: Abdomen is soft.     Tenderness: There is no abdominal tenderness. There is no rebound.  Musculoskeletal: Normal range of motion.        General: No tenderness.       Back:  Skin:    General: Skin is warm and dry.     Findings: No abrasion or rash.  Neurological:     Mental Status: She is alert and oriented to person, place, and time.     GCS: GCS eye subscore is 4. GCS verbal subscore is 5. GCS motor subscore is 6.     Cranial Nerves: No cranial nerve deficit.     Sensory: No sensory deficit.     Motor: No weakness or tremor.     Gait: Tandem walk normal.     Comments: Strength is 5 of 5 in all extremities  Psychiatric:        Speech: Speech normal.        Behavior: Behavior normal.  ED Treatments / Results  Labs (all labs ordered are  listed, but only abnormal results are displayed) Labs Reviewed - No data to display  EKG None  Radiology No results found.  Procedures Procedures (including critical care time)  Medications Ordered in ED Medications - No data to display   Initial Impression / Assessment and Plan / ED Course  I have reviewed the triage vital signs and the nursing notes.  Pertinent labs & imaging results that were available during my care of the patient were reviewed by me and considered in my medical decision making (see chart for details).        Patient musculoskeletal back pain.  Will treat conservative with Tylenol and call therapy  Final Clinical Impressions(s) / ED Diagnoses   Final diagnoses:  None    ED Discharge Orders    None       Lacretia Leigh, MD 12/13/18 [phone]

## 2018-12-13 NOTE — ED Triage Notes (Signed)
Patient reports new onset back pain, worse with any movement. States she is [redacted] weeks pregnant and thinks she pulled a muscle while pushing a heavy cart at the store yesterday. Ambulatory with steady gait. No other complaints.

## 2018-12-21 ENCOUNTER — Other Ambulatory Visit: Payer: Self-pay

## 2018-12-21 ENCOUNTER — Encounter: Payer: Self-pay | Admitting: Neurology

## 2018-12-21 ENCOUNTER — Ambulatory Visit (INDEPENDENT_AMBULATORY_CARE_PROVIDER_SITE_OTHER): Payer: 59 | Admitting: Neurology

## 2018-12-21 VITALS — BP 146/83 | HR 103 | Temp 97.8°F | Ht 67.0 in | Wt 212.5 lb

## 2018-12-21 DIAGNOSIS — R5383 Other fatigue: Secondary | ICD-10-CM | POA: Diagnosis not present

## 2018-12-21 DIAGNOSIS — Z3490 Encounter for supervision of normal pregnancy, unspecified, unspecified trimester: Secondary | ICD-10-CM | POA: Insufficient documentation

## 2018-12-21 DIAGNOSIS — R260 Ataxic gait: Secondary | ICD-10-CM

## 2018-12-21 DIAGNOSIS — G35 Multiple sclerosis: Secondary | ICD-10-CM

## 2018-12-21 DIAGNOSIS — G4700 Insomnia, unspecified: Secondary | ICD-10-CM

## 2018-12-21 DIAGNOSIS — E063 Autoimmune thyroiditis: Secondary | ICD-10-CM

## 2018-12-21 DIAGNOSIS — E038 Other specified hypothyroidism: Secondary | ICD-10-CM

## 2018-12-21 NOTE — Progress Notes (Signed)
[facility]  PATIENT: Taylor Donovan DOB: 07-Dec-1990  REFERRING DOCTOR OR PCP:  Vicenta Aly, FNP SOURCE: patient, notes from ED/hospital stay, lab results, imaging results, MRI images on PACS  _________________________________   HISTORICAL  CHIEF COMPLAINT:  Chief Complaint  Patient presents with  . Follow-up    RM 12, alone. Last seen 06/16/2018. She is [redacted] weeks pregnant. Gets to find out gender 01/03/2019  . Multiple Sclerosis    Stopped Tecfidera d/t pregnany.     HISTORY OF PRESENT ILLNESS:  Taylor Donovan is a 28 y.o. woman with MS reporting new left sided visual changes  Update 12/21/2018: She is currently [redacted] weeks pregnant.  She will find out the gender of the baby soon.  Her due date is 06/02/2019.  She stopped the Tecfidera due to the pregnancy and has not had any exacerbations and has no new MS symptoms.   She had tolerated Tecfidera well and had no GI issues or flushing.  She stopped her other medications as well.  Her gait is doing well.  She did pull a muscle in her back and had a lot of stiffness for a few days.   She still feels sore but not as stiff.   She denies arm or leg weakness or numbness. No vision issues.   She has some bladder urgency.  She has noted more fatigue the past month.   She is sleeping better though (getting 8 hours nightly).   She has had thyroid dysfunction and has needed higher doses of synthroid.    He shoulder pain resolved  Update 06/16/2018: She is doing well on Tecfidera and has no exacerbations recently or new symptoms.     Her gait is doing well.    She feels clumsier but just a little worse.    She sometimes hits the walls or door jambs.     She occ trips.   Strength and spasticity are doing well.   Neck, hips and lower back are hurting a little more.    She also has some neck pain and stiffness.   She is on gabapentin and baclofen.    She does not take NSAIDs regularly but will occ take 800 mg Ibu.    She  notes some numbness in hands and feet.     Bladder urgency is mildly worse.    Vision is doing well though she has some light sensitivity.    Fatigue occurs daily and is just a little better after leaving work.   She has insomnia and trazodone helps slightly but if in more pain does worse.      The subacromial bursa injection helped a lot.    Update 12/16/2017: She feels her MS has been stable for the most part and there are no exacerbations.   She is tolerating the Tecfidera well and currently has no side effects.,    She denies difficulty with her gait and no weakness.   She has some numbness in her feet, bilateral.  This is most noticeable with driving, even short distances.   She is on gabapentin and that helps some.   She takes 900 mg nightly.    She has difficulty falling asleep and staying asleep.   Sometimes she has trouble getting comfortable and other times she is not sure why she has insomnia.   She is still on baclofen 10 mg po bid.    She notes some fatigue, worse if she slept poorly.  Fatigue is better since  changing jobs.   She was on phentermine and felt it helped her a little bit.   Taking a nap helps.  She does not snore.      Mood is doing well.   She notes some difficulty with her short term memory and focus/attention.     The right shoulder is more painful and ROM is reduced, especially elevation.   Update 08/19/2017: She had the onset of laryngitis about 3 weeks.   She was tested for flu but not for strep throat.  She was placed on Amoxicillin.    She felt her throat infection was improving last week.   She went back to work over the weekend.      She was fine Saturday but on Sunday, as the evening went on, she had much more pain, mostly in the ankles and knees.   She also noted more tingling in her toes and a tremor.     She notes that increasing the gabapentin helped the tingling in the feet but not the leg pain.   The baclofen is helping her muscle spasms and also helping  sleep.     No definite exacerbation.   She tolerates Tecfidera well.    Update 06/10/2017: She feels her MS is stable,     She is on Tecfidera and she tolerates it well.   She denies exacerbations.    No abdominal symptoms or flushing.     She has more tenderness in her legs, right > left.   She notes some stiffness/spasticity.  Pain is achy.   She notes it more when she is not using.   It sometimes bothers her at night.    Flexeril at night did not help much.   She has not tried baclofen.    She is walking well and denies weakness.    She was having groin numbness and pain, helped by lamotrigine.    She started doing better and stopped the lamotrigine.   She is still on gabapentin.    Bladder is doing well on oxybutynin.         She has fatigue. She sometimes works 11-12 hours.    At first phentermine was helping but she felt it helped less after a few months.   Mood is doing better on escitalopram.     From 12/10/2016: Groin pain is better since starting lamotrigine.     However, her hips and lower back are hurting more.   Pain fluctuates but is not necessarily worse with changes in position.     LBP does not radiate into the legs.  She is noting some verbal fluency issues and STM seems a little worse.  Attention and focus seem worse.    She is often sleepy and notes fatigue and sometimes takes naps.    She is waking up more at night, sometimes to urinate and sometimes randomly.     After the left optic neuritis in March, vision improved again.   Colors are washed out a bit.     She was having some allergies affecting her eyes and she was advised to take a Benadryl at bedtime.     __________________   She is tolerating Tecfidera well and she denies any exacerbation.     About 10 days ago, she had the onset of left visual blurriness associated with left eye pain.    Pain worseend with moving her eyes.   She had flu-like symptoms and neck stiffness with her IV steroid   treatment.    She had  similar but milder symptoms the first time she was on IV Steroids.   Etodolac and Flexeril helped her myalgias.     MS:   She did not qualify for a drug study and we have had trouble getting her started on Tecfidera.   She does not feel she could self inject and so can not be on interferon or glatiramer.  We were finally able to get approval for Tecfidera and she should be getting her first shipment within a week.    Gait/strength/sensation: She feels mildly clumsy with her gait but has no falls. 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  . She denies any significant weakness or numbness in the arms or legs.  Bladder/bowel: She notes urinary frequency with extreme urgency a times and has nocturia twice most nights.  She has rare incontinence when she can't get to bathroom in time (happened once at work).   She notes some constipation.  Fatigue/sleep: She notes fatigue on a daily basis that is worse as the day goes on. She always feels sleepy. She falls asleep easily and will get a couple hours of sleep. However, once she wakes up she has difficulty falling back asleep. This has been present the last year. He did worse while on steroids.   She denies RLS.    Mood/Cognition: She denies much depression or anxiety. She has no major cognitive issues but she has had some problems with verbal fluency. The difficulties with verbal fluency began this year.  MS History:   She presented with left optic neuritis in October 2017.   MRI of the brain showed several other foci, worrisome for multiple sclerosis. One of the foci is in the right middle cerebellar peduncle and a couple of the foci are periventricular. I personally reviewed the MRI of the brain dated 01/07/2016. There are several T2/FLAIR hyperintense foci, one is in the right middle cerebellar peduncle and a couple are in the periventricular white matter. Other foci are in the subcortical deep white  matter. None of the foci enhanced after contrast admission to the hospital.   I also reviewed the MRI of the orbits performed that day. That MRI was normal.   I reviewed the admission and discharge notes from her emergency room visit and hospital stay this month.   TSH was elevated and B12 was reduced.   ESR, CRP, RPR, Lyme Abs/DNA, ANA were negative.     REVIEW OF SYSTEMS: Constitutional: No fevers, chills, sweats, or change in appetite. She notes fatigue and some sleepiness. Eyes: as above Ear, nose and throat: No hearing loss, ear pain, nasal congestion, sore throat Cardiovascular: No chest pain, palpitations Respiratory: No shortness of breath at rest or with exertion.   No wheezes.   She snores. GastrointestinaI: No nausea, vomiting, diarrhea, abdominal pain, fecal incontinence Genitourinary: No dysuria, urinary retention or frequency.  2 x nocturia. Musculoskeletal: No neck pain, back pain Integumentary: No rash, pruritus, skin lesions Neurological: as above Psychiatric: Notes some depression better on Lexapro.  No anxiety Endocrine: No palpitations, diaphoresis, change in appetite, change in weigh or increased thirst Hematologic/Lymphatic: No anemia, purpura, petechiae. Allergic/Immunologic: No itchy/runny eyes, nasal congestion, recent allergic reactions, rashes  ALLERGIES: No Known Allergies  HOME MEDICATIONS:  Current Outpatient Medications:  .  SYNTHROID 125 MCG tablet, Take 125 mcg by mouth daily. , Disp: , Rfl: 5  PAST MEDICAL HISTORY: Past Medical History:  Diagnosis Date  . Acid reflux   . Depression   . Hypothyroid   . Multiple sclerosis (HCC)   . Ovarian cyst   . PCOS (polycystic ovarian syndrome)   . Thyroid disease   . Vision abnormalities     PAST SURGICAL HISTORY: History reviewed. No pertinent surgical history.  FAMILY HISTORY: Family History  Problem Relation Age of Onset  . Hypertension Mother   . Diabetes Mellitus II Mother   . Healthy  Father     SOCIAL HISTORY:  Social History   Socioeconomic History  . Marital status: Single    Spouse name: Not on file  . Number of children: Not on file  . Years of education: Not on file  . Highest education level: Not on file  Occupational History  . Not on file  Social Needs  . Financial resource strain: Not on file  . Food insecurity    Worry: Not on file    Inability: Not on file  . Transportation needs    Medical: Not on file    Non-medical: Not on file  Tobacco Use  . Smoking status: Current Every Day Smoker    Packs/day: 0.50    Years: 8.00    Pack years: 4.00    Types: Cigarettes  . Smokeless tobacco: Never Used  Substance and Sexual Activity  . Alcohol use: Yes    Comment: occasionally  . Drug use: No  . Sexual activity: Yes    Birth control/protection: Pill  Lifestyle  . Physical activity    Days per week: Not on file    Minutes per session: Not on file  . Stress: Not on file  Relationships  . Social connections    Talks on phone: Not on file    Gets together: Not on file    Attends religious service: Not on file    Active member of club or organization: Not on file    Attends meetings of clubs or organizations: Not on file    Relationship status: Not on file  . Intimate partner violence    Fear of current or ex partner: Not on file    Emotionally abused: Not on file    Physically abused: Not on file    Forced sexual activity: Not on file  Other Topics Concern  . Not on file  Social History Narrative  . Not on file     PHYSICAL EXAM  Vitals:   12/21/18 1457  BP: (!) 146/83  Pulse: (!) 103  Temp: 97.8 F (36.6 C)  Weight: 212 lb 8 oz (96.4 kg)  Height: 5' 7" (1.702 m)    Body mass index is 33.28 kg/m.   General: The patient is well-developed and well-nourished and in no acute distress.   Musculoskeletal: She has tenderness over the trochanteric bursae Neurologic Exam  Mental status: The patient is alert and oriented x 3 at  the time of the examination. The patient has apparent normal recent and remote memory, with an apparently normal attention span and concentration ability.   Speech is normal.  Cranial nerves: Extraocular movements are full. reduced left eye visual acuity and color vision.   Facial strength and sensation is normal. Trapezius strength is normal. No dysarthria is noted.  The tongue is midline, and the patient has symmetric elevation of the soft palate. No obvious hearing deficits are noted.  Motor:  Muscle bulk is normal.   Muscle tone and strength are fine.  Sensory: She reported symmetric touch   and vibration sensation in the arms and legs.  Coordination: Cerebellar testing shows good finger-nose-finger and heel-to-shin bilaterally..  Gait and station: Station is normal.   Gait is normal. Mildly wide tandem gait. Romberg is negative.   Reflexes: Deep tendon reflexes are symmetric and normal bilaterally.       DIAGNOSTIC DATA (LABS, IMAGING, TESTING) - I reviewed patient records, labs, notes, testing and imaging myself where available.  Lab Results  Component Value Date   WBC 5.3 06/16/2018   HGB 12.6 06/16/2018   HCT 37.0 06/16/2018   MCV 86 06/16/2018   PLT 260 06/16/2018      Component Value Date/Time   NA 138 03/15/2017 1942   K 4.0 03/15/2017 1942   CL 107 03/15/2017 1942   CO2 27 03/15/2017 1942   GLUCOSE 79 03/15/2017 1942   BUN 10 03/15/2017 1942   CREATININE 0.67 03/15/2017 1942   CALCIUM 8.9 03/15/2017 1942   PROT 7.1 03/15/2017 1942   ALBUMIN 4.2 03/15/2017 1942   AST 15 03/15/2017 1942   ALT 15 03/15/2017 1942   ALKPHOS 60 03/15/2017 1942   BILITOT 0.5 03/15/2017 1942   GFRNONAA >60 03/15/2017 1942   GFRAA >60 03/15/2017 1942   No results found for: CHOL, HDL, LDLCALC, LDLDIRECT, TRIG, CHOLHDL      ASSESSMENT AND PLAN  Multiple sclerosis (HCC)  Other fatigue  Ataxic gait  Insomnia, unspecified type  Hypothyroidism due to Hashimoto's thyroiditis   Pregnancy, unspecified gestational age    1.     Remain off Tecfidera but restart after breastfeeding and consider sooner after delivery if breastfeeds longer. 2.    Continue off other medications 3.   Stay active and exercise 4.   Return in 5-6 months or sooner if there are new or worsening neurologic symptoms.   Richard A. Sater, MD, PhD 12/21/2018, 5:06 PM Certified in Neurology, Clinical Neurophysiology, Sleep Medicine, Pain Medicine and Neuroimaging  [facility] [address], [address] , [place] [address] [phone]  

## 2019-01-03 DIAGNOSIS — O4440 Low lying placenta NOS or without hemorrhage, unspecified trimester: Secondary | ICD-10-CM | POA: Insufficient documentation

## 2019-03-23 ENCOUNTER — Ambulatory Visit
Admission: EM | Admit: 2019-03-23 | Discharge: 2019-03-23 | Disposition: A | Discharge status: Home / Self Care | Payer: 59 | Attending: Emergency Medicine | Admitting: Emergency Medicine

## 2019-03-23 ENCOUNTER — Other Ambulatory Visit: Payer: Self-pay

## 2019-03-23 ENCOUNTER — Encounter: Payer: Self-pay | Admitting: Emergency Medicine

## 2019-03-23 DIAGNOSIS — L02415 Cutaneous abscess of right lower limb: Secondary | ICD-10-CM | POA: Diagnosis not present

## 2019-03-23 MED ORDER — CEPHALEXIN 500 MG PO CAPS: 500.0000 mg | ORAL_CAPSULE | Freq: Four times a day (QID) | ORAL | 0 refills | Status: AC

## 2019-03-23 NOTE — ED Provider Notes (Signed)
[facility]    CSN: [id number] Arrival date & time: 03/23/19  1142      History   Chief Complaint Chief Complaint  Patient presents with  . Insect Bite    HPI Taylor Donovan is a 28 y.o. female [redacted] weeks gestation presenting for right posterior thigh pain status post suspected bug bite.  Patient unsure if she was bitten by something, just states that she felt some itching, pain 1 day.  Patient states that her mom was able to look at the back of her leg via FaceTime today and noticed a small hole prompting patient's evaluation today.  Has felt something run down her leg, though is unsure consistency, color of discharge.  Patient able to bear weight and denies fevers, arthralgias, myalgias.  States she has been keeping area clean/dry, and putting Neosporin over it as well as using hot compresses a few times a day that significant relief.  Past Medical History:  Diagnosis Date  . Acid reflux   . Depression   . Hypothyroid   . Multiple sclerosis (HCC)   . Ovarian cyst   . PCOS (polycystic ovarian syndrome)   . Thyroid disease   . Vision abnormalities     Patient Active Problem List   Diagnosis Date Noted  . Pregnancy 12/21/2018  . Right shoulder pain 12/16/2017  . Multiple joint pain 08/19/2017  . Other fatigue 06/10/2017  . Depression 06/10/2017  . Bilateral hip bursitis 12/10/2016  . BMI 35.0-35.9,adult 12/10/2016  . Insomnia 06/10/2016  . Urinary urgency 06/10/2016  . Multiple sclerosis (HCC) 01/29/2016  . 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 Vision loss of left eye 01/08/2016  . Abnormal brain MRI 01/08/2016  . Hypothyroidism 01/08/2016  . Vision loss, left eye 01/08/2016  . HASHIMOTO'S THYROIDITIS 08/13/2009  . DYSPNEA ON EXERTION 08/13/2009    History reviewed. No pertinent surgical history.  OB History    Gravida  1   Para      Term      Preterm      AB      Living        SAB      TAB      Ectopic      Multiple      Live Births               Home Medications    Prior to Admission medications   Medication Sig Start Date End Date Taking? Authorizing Provider  cephALEXin (KEFLEX) 500 MG capsule Take 1 capsule (500 mg total) by mouth 4 (four) times daily for 7 days. 03/23/19 03/30/19  Hall-Potvin, [place], PA-C  SYNTHROID 125 MCG tablet Take 125 mcg by mouth daily.  05/10/16   [provider]    Family History Family History  Problem Relation Age of Onset  . Hypertension Mother   . Diabetes Mellitus II Mother   . Healthy Father     Social History Social History   Tobacco Use  . Smoking status: Current Every Day Smoker    Packs/day: 0.50    Years: 8.00    Pack years: 4.00    Types: Cigarettes  . Smokeless tobacco: Never Used  Substance Use Topics  . Alcohol use: Yes    Comment: occasionally  . Drug use: No     Allergies   Patient has no known allergies.   Review of Systems Review of Systems  Constitutional: Negative for fatigue and fever.  HENT: Negative for  ear pain, sinus pain, sore throat and voice change.   Eyes: Negative for pain, redness and visual disturbance.  Respiratory: Negative for cough and shortness of breath.   Cardiovascular: Negative for chest pain and palpitations.  Gastrointestinal: Negative for abdominal pain, diarrhea and vomiting.  Musculoskeletal: Negative for arthralgias and myalgias.  Skin: Positive for wound. Negative for rash.  Neurological: Negative for syncope and headaches.     Physical Exam Triage Vital Signs ED Triage Vitals [03/23/19 1153]  Enc Vitals Group     BP 116/69     Pulse Rate (!) 112     Resp 18     Temp 97.8 F (36.6 C)     Temp Source Temporal     SpO2 97 %     Weight      Height      Head Circumference      Peak Flow      Pain Score 2     Pain Loc      Pain Edu?      Excl. in [place]?    No data found.  Updated Vital Signs BP 116/69 (BP Location: Left Arm)   Pulse (!) 112   Temp 97.8  F (36.6 C) (Temporal)   Resp 18   SpO2 97%   Visual Acuity Right Eye Distance:   Left Eye Distance:   Bilateral Distance:    Right Eye Near:   Left Eye Near:    Bilateral Near:     Physical Exam Constitutional:      General: She is not in acute distress. HENT:     Head: Normocephalic and atraumatic.  Eyes:     General: No scleral icterus.    Pupils: Pupils are equal, round, and reactive to light.  Cardiovascular:     Rate and Rhythm: Normal rate.  Pulmonary:     Effort: Pulmonary effort is normal.  Skin:    Coloration: Skin is not jaundiced or pale.       Neurological:     Mental Status: She is alert and oriented to person, place, and time.      UC Treatments / Results  Labs (all labs ordered are listed, but only abnormal results are displayed) Labs Reviewed - No data to display  EKG   Radiology No results found.  Procedures Procedures (including critical care time)  Medications Ordered in UC Medications - No data to display  Initial Impression / Assessment and Plan / UC Course  I have reviewed the triage vital signs and the nursing notes.  Pertinent labs & imaging results that were available during my care of the patient were reviewed by me and considered in my medical decision making (see chart for details).     Patient afebrile, nontoxic.  Lesion concerning for abscess that opened on its own.  Given failure to improve with conservative management, will add antibiotic at this time.  Return precautions discussed, patient verbalized understanding and is agreeable to plan. Final Clinical Impressions(s) / UC Diagnoses   Final diagnoses:  Cutaneous abscess of right lower extremity     Discharge Instructions     Take antibiotic as prescribed: 1 to take 4 times daily with food to adequately cover infection, and avoid GI upset. Return for worsening pain, discharge, fever, joint pain.    ED Prescriptions    Medication Sig Dispense Auth. Provider    cephALEXin (KEFLEX) 500 MG capsule Take 1 capsule (500 mg total) by mouth 4 (four) times daily for 7  days. 28 capsule Hall-Potvin, [place], PA-C     PDMP not reviewed this encounter.   Hall-Potvin, [place], [place] 03/24/19 1656

## 2019-03-23 NOTE — ED Triage Notes (Addendum)
Pt presents to [facility] for assessment of cyst/abscess to the right posterior thigh x 1 week.  Patient is [redacted] weeks pregnant, and states she has not been able to visualize the area.  Had her mom look at it on facetime today and there is a "hole".  Pt states the area has been draining.  C/o feeling run down the past few days.

## 2019-03-23 NOTE — ED Notes (Signed)
Patient able to ambulate independently  

## 2019-03-23 NOTE — Discharge Instructions (Signed)
Take antibiotic as prescribed: 1 to take 4 times daily with food to adequately cover infection, and avoid GI upset. Return for worsening pain, discharge, fever, joint pain.

## 2019-04-21 ENCOUNTER — Other Ambulatory Visit: Payer: Self-pay

## 2019-04-21 ENCOUNTER — Ambulatory Visit (HOSPITAL_COMMUNITY)
Admission: RE | Admit: 2019-04-21 | Discharge: 2019-04-21 | Disposition: A | Discharge status: Home / Self Care | Payer: 59 | Source: Ambulatory Visit | Attending: Obstetrics and Gynecology | Admitting: Obstetrics and Gynecology

## 2019-04-21 ENCOUNTER — Other Ambulatory Visit (HOSPITAL_COMMUNITY): Payer: Self-pay

## 2019-04-21 DIAGNOSIS — R600 Localized edema: Secondary | ICD-10-CM

## 2019-04-21 NOTE — Progress Notes (Signed)
VASCULAR LAB PRELIMINARY  PRELIMINARY  PRELIMINARY  PRELIMINARY  Bilateral lower extremity venous duplex completed.    Preliminary report:  See CV proc for preliminary results.  Gave Tiffany results  Agueda Houpt, RVT 04/21/2019, 1:40 PM

## 2019-05-02 LAB — OB RESULTS CONSOLE GBS: GBS: POSITIVE

## 2019-05-24 ENCOUNTER — Encounter (HOSPITAL_COMMUNITY): Payer: Self-pay | Admitting: *Deleted

## 2019-05-24 ENCOUNTER — Telehealth (HOSPITAL_COMMUNITY): Payer: Self-pay | Admitting: *Deleted

## 2019-05-24 NOTE — Telephone Encounter (Signed)
Preadmission screen  

## 2019-05-30 ENCOUNTER — Other Ambulatory Visit (HOSPITAL_COMMUNITY)
Admission: RE | Admit: 2019-05-30 | Discharge: 2019-05-30 | Disposition: A | Discharge status: Home / Self Care | Payer: No Typology Code available for payment source | Source: Ambulatory Visit | Attending: Obstetrics and Gynecology | Admitting: Obstetrics and Gynecology

## 2019-05-30 ENCOUNTER — Other Ambulatory Visit: Payer: Self-pay | Admitting: Obstetrics and Gynecology

## 2019-05-30 LAB — SARS CORONAVIRUS 2 (TAT 6-24 HRS): SARS Coronavirus 2: NEGATIVE

## 2019-05-30 NOTE — H&P (Signed)
Sharetha Newson is a 29 y.o. female G2P1001 at 39+ for IOL given elective at term.  Pregnancy complicated by maternal hypothyroidism, maternal tobacco use, Muscular Sclerosis, and RH neg - received Rhogam 12/8.  Received flu vaccine 11/3.  Had LLP resolved by [place].  Essential panel is negative.  First trimester screen is WNL.  D/W pt IOL including r/b/a and process.  Pt with GBBS +  OB History    Gravida  2   Para  1   Term      Preterm      AB      Living        SAB      TAB      Ectopic      Multiple      Live Births            G1 10/2009 40wk SVD 8# G2 present  H/o abn pap, last 8/20 WNL No STD  Past Medical History:  Diagnosis Date  . Acid reflux   . Depression    pp  . Hypothyroid   . Multiple sclerosis (HCC)   . Ovarian cyst   . PCOS (polycystic ovarian syndrome)   . Thyroid disease   . Vision abnormalities    No past surgical history on file. Family History: family history includes Diabetes Mellitus II in her mother; Healthy in her father, maternal aunt, and maternal grandmother; Hypertension in her mother. Social History:  reports that she has been smoking cigarettes. She has a 4.00 pack-year smoking history. She has never used smokeless tobacco. She reports current alcohol use. She reports that she does not use drugs. SAHM, married  Meds PNV, Synthroid All NKDA     Maternal Diabetes: No Genetic Screening: Normal Maternal Ultrasounds/Referrals: Normal Fetal Ultrasounds or other Referrals:  None Maternal Substance Abuse:  No Significant Maternal Medications:  None Significant Maternal Lab Results:  Group B Strep positive Other Comments:  None  Review of Systems  Constitutional: Negative.   HENT: Negative.   Eyes: Negative.   Respiratory: Negative.   Cardiovascular: Negative.   Gastrointestinal: Negative.   Genitourinary: Negative.   Musculoskeletal: Negative.   Skin: Negative.   Neurological: Negative.   Psychiatric/Behavioral:  Negative.    Maternal Medical History:  Contractions: Frequency: irregular.    Fetal activity: Perceived fetal activity is normal.    Prenatal complications: Rh neg, hypothyroidism, MS, tobacco use  Prenatal Complications - Diabetes: none.      Last menstrual period 08/26/2018. Maternal Exam:  Abdomen: Patient reports no abdominal tenderness. Fundal height is appropriate for gestation.   Estimated fetal weight is 8#.   Fetal presentation: vertex  Introitus: Normal vulva. Normal vagina.    Physical Exam  Constitutional: She is oriented to person, place, and time. She appears well-developed and well-nourished.  HENT:  Head: Normocephalic and atraumatic.  Cardiovascular: Normal rate and regular rhythm.  Respiratory: Effort normal and breath sounds normal.  GI: Soft. Bowel sounds are normal. She exhibits no distension. There is no abdominal tenderness.  Genitourinary:    Vulva normal.   Musculoskeletal:        General: Normal range of motion.  Neurological: She is alert and oriented to person, place, and time.  Skin: Skin is warm and dry.  Psychiatric: She has a normal mood and affect. Her behavior is normal.    Prenatal labs: ABO, Rh: O/--/n (08/04 0000) Antibody: Negative (08/04 0000) Rubella: Immune (08/04 0000) RPR: Nonreactive (08/04 0000)  HBsAg: Negative (08/04 0000)  HIV: Non-reactive (08/04 0000)  GBS: Positive/-- (02/01 0000)   Hgb 13.0/Plt 279/ Ur Cx neg/Chl neg/GC neg/ Varicella immune/nl NT - nl first tri screen/glucola 118/essential panel neg/ [place] nl anat, ant LLP plac, female  LLP resolved  Assessment/Plan: 28yo G2P1001 at 39+ for IOL given term and elective gbbs + - prophylaxis w PCN IOL with AROM after PCN and pitocin Epidural prn Expect SVD   Annamay Laymon Bovard-Stuckert 05/30/2019, 9:23 PM

## 2019-05-30 NOTE — H&P (Deleted)
  The note originally documented on this encounter has been moved the the encounter in which it belongs.  

## 2019-05-31 ENCOUNTER — Encounter (HOSPITAL_COMMUNITY): Payer: Self-pay | Admitting: Obstetrics and Gynecology

## 2019-05-31 ENCOUNTER — Inpatient Hospital Stay (HOSPITAL_COMMUNITY): Payer: No Typology Code available for payment source | Admitting: Anesthesiology

## 2019-05-31 ENCOUNTER — Inpatient Hospital Stay (HOSPITAL_COMMUNITY): Payer: No Typology Code available for payment source

## 2019-05-31 ENCOUNTER — Inpatient Hospital Stay (HOSPITAL_COMMUNITY)
Admission: AD | Admit: 2019-05-31 | Discharge: 2019-06-04 | DRG: 788 | Disposition: A | Discharge status: Home / Self Care | Payer: No Typology Code available for payment source | Attending: Obstetrics and Gynecology | Admitting: Obstetrics and Gynecology

## 2019-05-31 ENCOUNTER — Other Ambulatory Visit: Payer: Self-pay

## 2019-05-31 DIAGNOSIS — O99824 Streptococcus B carrier state complicating childbirth: Secondary | ICD-10-CM | POA: Diagnosis present

## 2019-05-31 DIAGNOSIS — O99284 Endocrine, nutritional and metabolic diseases complicating childbirth: Secondary | ICD-10-CM | POA: Diagnosis present

## 2019-05-31 DIAGNOSIS — Z6791 Unspecified blood type, Rh negative: Secondary | ICD-10-CM

## 2019-05-31 DIAGNOSIS — Z3A39 39 weeks gestation of pregnancy: Secondary | ICD-10-CM

## 2019-05-31 DIAGNOSIS — O26893 Other specified pregnancy related conditions, third trimester: Secondary | ICD-10-CM | POA: Diagnosis present

## 2019-05-31 DIAGNOSIS — O99354 Diseases of the nervous system complicating childbirth: Secondary | ICD-10-CM | POA: Diagnosis present

## 2019-05-31 DIAGNOSIS — O9081 Anemia of the puerperium: Secondary | ICD-10-CM | POA: Diagnosis not present

## 2019-05-31 DIAGNOSIS — Z3483 Encounter for supervision of other normal pregnancy, third trimester: Secondary | ICD-10-CM

## 2019-05-31 DIAGNOSIS — Z20822 Contact with and (suspected) exposure to covid-19: Secondary | ICD-10-CM | POA: Diagnosis present

## 2019-05-31 DIAGNOSIS — G35 Multiple sclerosis: Secondary | ICD-10-CM | POA: Diagnosis present

## 2019-05-31 DIAGNOSIS — F17210 Nicotine dependence, cigarettes, uncomplicated: Secondary | ICD-10-CM | POA: Diagnosis present

## 2019-05-31 DIAGNOSIS — O99334 Smoking (tobacco) complicating childbirth: Secondary | ICD-10-CM | POA: Diagnosis present

## 2019-05-31 DIAGNOSIS — E039 Hypothyroidism, unspecified: Secondary | ICD-10-CM | POA: Diagnosis present

## 2019-05-31 DIAGNOSIS — Z98891 History of uterine scar from previous surgery: Secondary | ICD-10-CM

## 2019-05-31 LAB — CBC
HCT: 35.6 % — ABNORMAL LOW (ref 36.0–46.0)
Hemoglobin: 11.7 g/dL — ABNORMAL LOW (ref 12.0–15.0)
MCH: 29.8 pg (ref 26.0–34.0)
MCHC: 32.9 g/dL (ref 30.0–36.0)
MCV: 90.8 fL (ref 80.0–100.0)
Platelets: 318 10*3/uL (ref 150–400)
RBC: 3.92 MIL/uL (ref 3.87–5.11)
RDW: 13.1 % (ref 11.5–15.5)
WBC: 8.7 10*3/uL (ref 4.0–10.5)
nRBC: 0 % (ref 0.0–0.2)

## 2019-05-31 LAB — RPR: RPR Ser Ql: NONREACTIVE

## 2019-05-31 LAB — TYPE AND SCREEN
ABO/RH(D): O NEG
Antibody Screen: NEGATIVE

## 2019-05-31 LAB — ABO/RH: ABO/RH(D): O NEG

## 2019-05-31 MED ORDER — EPHEDRINE 5 MG/ML INJ: 10.0000 mg | INTRAVENOUS | Status: DC | PRN

## 2019-05-31 MED ORDER — PENICILLIN G POT IN DEXTROSE 60000 UNIT/ML IV SOLN
3.0000 10*6.[IU] | INTRAVENOUS | Status: DC
  Administered 2019-05-31 – 2019-06-01 (×4): 3 10*6.[IU] via INTRAVENOUS
  Filled 2019-05-31 (×4): qty 50

## 2019-05-31 MED ORDER — LIDOCAINE HCL (PF) 1 % IJ SOLN: 30.0000 mL | INTRAMUSCULAR | Status: DC | PRN

## 2019-05-31 MED ORDER — BUTORPHANOL TARTRATE 1 MG/ML IJ SOLN: 1.0000 mg | INTRAMUSCULAR | Status: DC | PRN

## 2019-05-31 MED ORDER — LEVOTHYROXINE SODIUM 50 MCG PO TABS
250.0000 ug | ORAL_TABLET | Freq: Every day | ORAL | Status: DC
  Administered 2019-05-31 – 2019-06-04 (×4): 250 ug via ORAL
  Filled 2019-05-31 (×2): qty 1
  Filled 2019-05-31: qty 2
  Filled 2019-05-31: qty 1
  Filled 2019-05-31: qty 2

## 2019-05-31 MED ORDER — LACTATED RINGERS IV SOLN: INTRAVENOUS | Status: DC

## 2019-05-31 MED ORDER — PHENYLEPHRINE 40 MCG/ML (10ML) SYRINGE FOR IV PUSH (FOR BLOOD PRESSURE SUPPORT): 80.0000 ug | PREFILLED_SYRINGE | INTRAVENOUS | Status: DC | PRN

## 2019-05-31 MED ORDER — OXYTOCIN BOLUS FROM INFUSION: 500.0000 mL | Freq: Once | INTRAVENOUS | Status: DC

## 2019-05-31 MED ORDER — OXYCODONE-ACETAMINOPHEN 5-325 MG PO TABS: 1.0000 | ORAL_TABLET | ORAL | Status: DC | PRN

## 2019-05-31 MED ORDER — LACTATED RINGERS IV SOLN
500.0000 mL | Freq: Once | INTRAVENOUS | Status: AC
  Administered 2019-05-31: 500 mL via INTRAVENOUS

## 2019-05-31 MED ORDER — LIDOCAINE HCL (PF) 1 % IJ SOLN
INTRAMUSCULAR | Status: DC | PRN
  Administered 2019-05-31 (×2): 4 mL via EPIDURAL

## 2019-05-31 MED ORDER — OXYTOCIN 40 UNITS IN NORMAL SALINE INFUSION - SIMPLE MED
1.0000 m[IU]/min | INTRAVENOUS | Status: DC
  Administered 2019-05-31: 2 m[IU]/min via INTRAVENOUS
  Filled 2019-05-31: qty 1000

## 2019-05-31 MED ORDER — SODIUM CHLORIDE (PF) 0.9 % IJ SOLN
INTRAMUSCULAR | Status: DC | PRN
  Administered 2019-05-31: 12 mL/h via EPIDURAL

## 2019-05-31 MED ORDER — OXYCODONE-ACETAMINOPHEN 5-325 MG PO TABS: 2.0000 | ORAL_TABLET | ORAL | Status: DC | PRN

## 2019-05-31 MED ORDER — SOD CITRATE-CITRIC ACID 500-334 MG/5ML PO SOLN
30.0000 mL | ORAL | Status: DC | PRN
  Administered 2019-05-31 – 2019-06-01 (×3): 30 mL via ORAL
  Filled 2019-05-31 (×3): qty 30

## 2019-05-31 MED ORDER — TERBUTALINE SULFATE 1 MG/ML IJ SOLN
0.2500 mg | Freq: Once | INTRAMUSCULAR | Status: AC | PRN
  Administered 2019-06-01: 0.25 mg via SUBCUTANEOUS
  Filled 2019-05-31: qty 1

## 2019-05-31 MED ORDER — SODIUM CHLORIDE 0.9 % IV SOLN
5.0000 10*6.[IU] | Freq: Once | INTRAVENOUS | Status: AC
  Administered 2019-05-31: 5 10*6.[IU] via INTRAVENOUS
  Filled 2019-05-31: qty 5

## 2019-05-31 MED ORDER — FENTANYL-BUPIVACAINE-NACL 0.5-0.125-0.9 MG/250ML-% EP SOLN
EPIDURAL | Status: AC
  Filled 2019-05-31: qty 250

## 2019-05-31 MED ORDER — LACTATED RINGERS IV SOLN
500.0000 mL | INTRAVENOUS | Status: DC | PRN
  Administered 2019-06-01 (×3): 500 mL via INTRAVENOUS

## 2019-05-31 MED ORDER — DIPHENHYDRAMINE HCL 50 MG/ML IJ SOLN: 12.5000 mg | INTRAMUSCULAR | Status: DC | PRN

## 2019-05-31 MED ORDER — OXYTOCIN 40 UNITS IN NORMAL SALINE INFUSION - SIMPLE MED: 2.5000 [IU]/h | INTRAVENOUS | Status: DC

## 2019-05-31 MED ORDER — FENTANYL-BUPIVACAINE-NACL 0.5-0.125-0.9 MG/250ML-% EP SOLN: 12.0000 mL/h | EPIDURAL | Status: DC | PRN

## 2019-05-31 MED ORDER — ACETAMINOPHEN 325 MG PO TABS: 650.0000 mg | ORAL_TABLET | ORAL | Status: DC | PRN

## 2019-05-31 MED ORDER — ONDANSETRON HCL 4 MG/2ML IJ SOLN
4.0000 mg | Freq: Four times a day (QID) | INTRAMUSCULAR | Status: DC | PRN
  Administered 2019-06-01: 4 mg via INTRAVENOUS
  Filled 2019-05-31: qty 2

## 2019-05-31 NOTE — Progress Notes (Signed)
Patient ID: Taylor Donovan, female   DOB: 02-16-1991, 29 y.o.   MRN: [id number]  Comfortable w epidural  AFVSS some mildly elevated BP gen NAD FHTs 130-145, mod var, + accels, category 1 toco q  IUPC placed w/o diff/comp.   SVE 3.5/50/-2 Copious clear fluid  Expect SVD

## 2019-05-31 NOTE — Progress Notes (Signed)
Patient ID: Taylor Donovan, female   DOB: [birth year], 29 y.o.   MRN: [id number]   IOL 39+  Reviewed H&P, no changes.  Reviewed r/b/a and process of IOL  AFVSS gen NAD FHTs 150's mod var, category 1 toco some irr ctx  SVE 1.5/50/-2   Will AROM at lunch, due to GBBS + Expect SVD

## 2019-05-31 NOTE — Progress Notes (Signed)
Patient ID: Taylor Donovan, female   DOB: February 25, 1991, 29 y.o.   MRN: [id number] IOL 39wk+  Comfortable w epidural  AFVSS gen NAD FHTs 125-135. Mod var, + accel, +scalp stim w ROM, category 1 toco q 2-3 min  AROM for clear fluid, w/o diff/comp SVE 3/50/-2  Continue IOL

## 2019-05-31 NOTE — Anesthesia Preprocedure Evaluation (Signed)
Anesthesia Evaluation  Patient identified by MRN, date of birth, ID band Patient awake    Reviewed: Allergy & Precautions, Patient's Chart, lab work & pertinent test results  History of Anesthesia Complications Negative for: history of anesthetic complications  Airway Mallampati: II  TM Distance: >3 FB Neck ROM: Full    Dental no notable dental hx.    Pulmonary Current Smoker,    Pulmonary exam normal        Cardiovascular negative cardio ROS Normal cardiovascular exam     Neuro/Psych Depression MS    GI/Hepatic Neg liver ROS, GERD  ,  Endo/Other  Hypothyroidism   Renal/GU negative Renal ROS  negative genitourinary   Musculoskeletal negative musculoskeletal ROS (+)   Abdominal   Peds  Hematology negative hematology ROS (+)   Anesthesia Other Findings Day of surgery medications reviewed with patient.  Reproductive/Obstetrics negative OB ROS                             Anesthesia Physical Anesthesia Plan  ASA: II  Anesthesia Plan: Epidural   Post-op Pain Management:    Induction:   PONV Risk Score and Plan: Treatment may vary due to age or medical condition  Airway Management Planned: Natural Airway  Additional Equipment:   Intra-op Plan:   Post-operative Plan:   Informed Consent: I have reviewed the patients History and Physical, chart, labs and discussed the procedure including the risks, benefits and alternatives for the proposed anesthesia with the patient or authorized representative who has indicated his/her understanding and acceptance.       Plan Discussed with:   Anesthesia Plan Comments:         Anesthesia Quick Evaluation

## 2019-05-31 NOTE — Anesthesia Procedure Notes (Signed)
Epidural Patient location during procedure: OB Start time: 05/31/2019 1:37 PM End time: 05/31/2019 1:40 PM  Staffing Anesthesiologist: Kaylyn Layer, MD Performed: anesthesiologist   Preanesthetic Checklist Completed: patient identified, IV checked, risks and benefits discussed, monitors and equipment checked, pre-op evaluation and timeout performed  Epidural Patient position: sitting Prep: DuraPrep and site prepped and draped Patient monitoring: continuous pulse ox, blood pressure and heart rate Approach: midline Location: L3-L4 Injection technique: LOR air  Needle:  Needle type: Tuohy  Needle gauge: 17 G Needle length: 9 cm Needle insertion depth: 9 cm Catheter type: closed end flexible Catheter size: 19 Gauge Catheter at skin depth: 14 cm Test dose: negative and Other (1% lidocaine)  Assessment Events: blood not aspirated, injection not painful, no injection resistance, no paresthesia and negative IV test  Additional Notes Patient identified. Risks, benefits, and alternatives discussed with patient including but not limited to bleeding, infection, nerve damage, paralysis, failed block, incomplete pain control, headache, blood pressure changes, nausea, vomiting, reactions to medication, itching, and postpartum back pain. Confirmed with bedside nurse the patient's most recent platelet count. Confirmed with patient that they are not currently taking any anticoagulation, have any bleeding history, or any family history of bleeding disorders. Patient expressed understanding and wished to proceed. All questions were answered. Sterile technique was used throughout the entire procedure. Please see nursing notes for vital signs.   Crisp LOR after 2 needle redirections. Test dose was given through epidural catheter and negative prior to continuing to dose epidural or start infusion. Warning signs of high block given to the patient including shortness of breath, tingling/numbness in hands,  complete motor block, or any concerning symptoms with instructions to call for help. Patient was given instructions on fall risk and not to get out of bed. All questions and concerns addressed with instructions to call with any issues or inadequate analgesia.  Reason for block:procedure for pain

## 2019-05-31 NOTE — Progress Notes (Signed)
I met with patient briefly to introduce myself as part of the anesthesia team here at [facility]. I briefly interviewed the patient about medical history, anesthesia history, allergies, and answered any questions related to possible anesthetics. During this interview no family history of MH, nor medical contraindication noted for anesthesia. Patient made aware that anesthesia team is available when/if needed 24/7.

## 2019-06-01 ENCOUNTER — Encounter (HOSPITAL_COMMUNITY)
Admission: AD | Disposition: A | Discharge status: Home / Self Care | Payer: Self-pay | Source: Home / Self Care | Attending: Obstetrics and Gynecology

## 2019-06-01 ENCOUNTER — Encounter (HOSPITAL_COMMUNITY): Payer: Self-pay | Admitting: Obstetrics and Gynecology

## 2019-06-01 DIAGNOSIS — Z98891 History of uterine scar from previous surgery: Secondary | ICD-10-CM

## 2019-06-01 HISTORY — DX: History of uterine scar from previous surgery: Z98.891

## 2019-06-01 SURGERY — Surgical Case
Anesthesia: Epidural | Wound class: Clean Contaminated

## 2019-06-01 MED ORDER — ONDANSETRON HCL 4 MG/2ML IJ SOLN
INTRAMUSCULAR | Status: AC
  Filled 2019-06-01: qty 2

## 2019-06-01 MED ORDER — NALOXONE HCL 4 MG/10ML IJ SOLN
1.0000 ug/kg/h | INTRAVENOUS | Status: DC | PRN
  Filled 2019-06-01: qty 5

## 2019-06-01 MED ORDER — SIMETHICONE 80 MG PO CHEW
80.0000 mg | CHEWABLE_TABLET | Freq: Three times a day (TID) | ORAL | Status: DC
  Administered 2019-06-01 – 2019-06-04 (×10): 80 mg via ORAL
  Filled 2019-06-01 (×10): qty 1

## 2019-06-01 MED ORDER — DIBUCAINE (PERIANAL) 1 % EX OINT: 1.0000 "application " | TOPICAL_OINTMENT | CUTANEOUS | Status: DC | PRN

## 2019-06-01 MED ORDER — SENNOSIDES-DOCUSATE SODIUM 8.6-50 MG PO TABS
2.0000 | ORAL_TABLET | ORAL | Status: DC
  Administered 2019-06-01 – 2019-06-04 (×3): 2 via ORAL
  Filled 2019-06-01 (×3): qty 2

## 2019-06-01 MED ORDER — DIPHENHYDRAMINE HCL 50 MG/ML IJ SOLN: 12.5000 mg | INTRAMUSCULAR | Status: DC | PRN

## 2019-06-01 MED ORDER — NALBUPHINE HCL 10 MG/ML IJ SOLN: 5.0000 mg | Freq: Once | INTRAMUSCULAR | Status: DC | PRN

## 2019-06-01 MED ORDER — MORPHINE SULFATE (PF) 0.5 MG/ML IJ SOLN
INTRAMUSCULAR | Status: DC | PRN
  Administered 2019-06-01: 3 mg via EPIDURAL

## 2019-06-01 MED ORDER — LEVOTHYROXINE SODIUM 50 MCG PO TABS: 250.0000 ug | ORAL_TABLET | Freq: Every day | ORAL | Status: DC

## 2019-06-01 MED ORDER — LACTATED RINGERS IV SOLN: INTRAVENOUS | Status: DC

## 2019-06-01 MED ORDER — KETOROLAC TROMETHAMINE 30 MG/ML IJ SOLN
30.0000 mg | Freq: Once | INTRAMUSCULAR | Status: AC | PRN
  Administered 2019-06-01: 30 mg via INTRAVENOUS

## 2019-06-01 MED ORDER — OXYTOCIN 40 UNITS IN NORMAL SALINE INFUSION - SIMPLE MED
INTRAVENOUS | Status: AC
  Filled 2019-06-01: qty 1000

## 2019-06-01 MED ORDER — PRENATAL MULTIVITAMIN CH
1.0000 | ORAL_TABLET | Freq: Every day | ORAL | Status: DC
  Administered 2019-06-02 – 2019-06-03 (×2): 1 via ORAL
  Filled 2019-06-01 (×2): qty 1

## 2019-06-01 MED ORDER — PROMETHAZINE HCL 25 MG/ML IJ SOLN: 6.2500 mg | INTRAMUSCULAR | Status: DC | PRN

## 2019-06-01 MED ORDER — TETANUS-DIPHTH-ACELL PERTUSSIS 5-2.5-18.5 LF-MCG/0.5 IM SUSP: 0.5000 mL | Freq: Once | INTRAMUSCULAR | Status: DC

## 2019-06-01 MED ORDER — CEFAZOLIN SODIUM-DEXTROSE 2-4 GM/100ML-% IV SOLN
INTRAVENOUS | Status: AC
  Filled 2019-06-01: qty 100

## 2019-06-01 MED ORDER — KETOROLAC TROMETHAMINE 30 MG/ML IJ SOLN: 30.0000 mg | Freq: Four times a day (QID) | INTRAMUSCULAR | Status: AC | PRN

## 2019-06-01 MED ORDER — ACETAMINOPHEN 500 MG PO TABS
1000.0000 mg | ORAL_TABLET | Freq: Four times a day (QID) | ORAL | Status: AC
  Administered 2019-06-01 (×3): 1000 mg via ORAL
  Filled 2019-06-01 (×4): qty 2

## 2019-06-01 MED ORDER — SODIUM CHLORIDE 0.9% FLUSH: 3.0000 mL | INTRAVENOUS | Status: DC | PRN

## 2019-06-01 MED ORDER — MEPERIDINE HCL 25 MG/ML IJ SOLN: 6.2500 mg | INTRAMUSCULAR | Status: DC | PRN

## 2019-06-01 MED ORDER — LACTATED RINGERS AMNIOINFUSION: INTRAVENOUS | Status: DC

## 2019-06-01 MED ORDER — MORPHINE SULFATE (PF) 0.5 MG/ML IJ SOLN
INTRAMUSCULAR | Status: AC
  Filled 2019-06-01: qty 10

## 2019-06-01 MED ORDER — STERILE WATER FOR IRRIGATION IR SOLN
Status: DC | PRN
  Administered 2019-06-01: 1

## 2019-06-01 MED ORDER — DIPHENHYDRAMINE HCL 25 MG PO CAPS
25.0000 mg | ORAL_CAPSULE | Freq: Four times a day (QID) | ORAL | Status: DC | PRN
  Administered 2019-06-01: 25 mg via ORAL

## 2019-06-01 MED ORDER — SODIUM CHLORIDE 0.9 % IR SOLN
Status: DC | PRN
  Administered 2019-06-01: 1

## 2019-06-01 MED ORDER — DEXAMETHASONE SODIUM PHOSPHATE 10 MG/ML IJ SOLN
INTRAMUSCULAR | Status: DC | PRN
  Administered 2019-06-01: 10 mg via INTRAVENOUS

## 2019-06-01 MED ORDER — ONDANSETRON HCL 4 MG/2ML IJ SOLN: 4.0000 mg | Freq: Three times a day (TID) | INTRAMUSCULAR | Status: DC | PRN

## 2019-06-01 MED ORDER — LIDOCAINE-EPINEPHRINE (PF) 2 %-1:200000 IJ SOLN
INTRAMUSCULAR | Status: DC | PRN
  Administered 2019-06-01: 10 mL via EPIDURAL

## 2019-06-01 MED ORDER — NALBUPHINE HCL 10 MG/ML IJ SOLN: 5.0000 mg | INTRAMUSCULAR | Status: DC | PRN

## 2019-06-01 MED ORDER — NALOXONE HCL 0.4 MG/ML IJ SOLN: 0.4000 mg | INTRAMUSCULAR | Status: DC | PRN

## 2019-06-01 MED ORDER — LIDOCAINE-EPINEPHRINE (PF) 2 %-1:200000 IJ SOLN
INTRAMUSCULAR | Status: AC
  Filled 2019-06-01: qty 10

## 2019-06-01 MED ORDER — DEXAMETHASONE SODIUM PHOSPHATE 10 MG/ML IJ SOLN
INTRAMUSCULAR | Status: AC
  Filled 2019-06-01: qty 1

## 2019-06-01 MED ORDER — MENTHOL 3 MG MT LOZG: 1.0000 | LOZENGE | OROMUCOSAL | Status: DC | PRN

## 2019-06-01 MED ORDER — SIMETHICONE 80 MG PO CHEW
80.0000 mg | CHEWABLE_TABLET | ORAL | Status: DC
  Administered 2019-06-01 – 2019-06-04 (×3): 80 mg via ORAL
  Filled 2019-06-01 (×3): qty 1

## 2019-06-01 MED ORDER — DIPHENHYDRAMINE HCL 25 MG PO CAPS
25.0000 mg | ORAL_CAPSULE | ORAL | Status: DC | PRN
  Filled 2019-06-01: qty 1

## 2019-06-01 MED ORDER — IBUPROFEN 800 MG PO TABS
800.0000 mg | ORAL_TABLET | Freq: Three times a day (TID) | ORAL | Status: AC
  Administered 2019-06-01 – 2019-06-04 (×9): 800 mg via ORAL
  Filled 2019-06-01 (×9): qty 1

## 2019-06-01 MED ORDER — PHENYLEPHRINE 40 MCG/ML (10ML) SYRINGE FOR IV PUSH (FOR BLOOD PRESSURE SUPPORT)
PREFILLED_SYRINGE | INTRAVENOUS | Status: AC
  Filled 2019-06-01: qty 10

## 2019-06-01 MED ORDER — OXYTOCIN 40 UNITS IN NORMAL SALINE INFUSION - SIMPLE MED: 2.5000 [IU]/h | INTRAVENOUS | Status: AC

## 2019-06-01 MED ORDER — KETOROLAC TROMETHAMINE 30 MG/ML IJ SOLN
INTRAMUSCULAR | Status: AC
  Filled 2019-06-01: qty 1

## 2019-06-01 MED ORDER — SCOPOLAMINE 1 MG/3DAYS TD PT72
1.0000 | MEDICATED_PATCH | Freq: Once | TRANSDERMAL | Status: AC
  Administered 2019-06-01: 1.5 mg via TRANSDERMAL

## 2019-06-01 MED ORDER — WITCH HAZEL-GLYCERIN EX PADS: 1.0000 "application " | MEDICATED_PAD | CUTANEOUS | Status: DC | PRN

## 2019-06-01 MED ORDER — ONDANSETRON HCL 4 MG/2ML IJ SOLN
INTRAMUSCULAR | Status: DC | PRN
  Administered 2019-06-01: 4 mg via INTRAVENOUS

## 2019-06-01 MED ORDER — OXYTOCIN 40 UNITS IN NORMAL SALINE INFUSION - SIMPLE MED
INTRAVENOUS | Status: DC | PRN
  Administered 2019-06-01: 500 mL via INTRAVENOUS

## 2019-06-01 MED ORDER — HYDROMORPHONE HCL 1 MG/ML IJ SOLN: 0.2500 mg | INTRAMUSCULAR | Status: DC | PRN

## 2019-06-01 MED ORDER — PHENYLEPHRINE HCL (PRESSORS) 10 MG/ML IV SOLN
INTRAVENOUS | Status: DC | PRN
  Administered 2019-06-01: 100 ug via INTRAVENOUS
  Administered 2019-06-01: 200 ug via INTRAVENOUS
  Administered 2019-06-01: 100 ug via INTRAVENOUS

## 2019-06-01 MED ORDER — COCONUT OIL OIL: 1.0000 "application " | TOPICAL_OIL | Status: DC | PRN

## 2019-06-01 MED ORDER — SIMETHICONE 80 MG PO CHEW: 80.0000 mg | CHEWABLE_TABLET | ORAL | Status: DC | PRN

## 2019-06-01 MED ORDER — CEFAZOLIN SODIUM-DEXTROSE 2-3 GM-%(50ML) IV SOLR
INTRAVENOUS | Status: DC | PRN
  Administered 2019-06-01: 2 g via INTRAVENOUS

## 2019-06-01 MED ORDER — ZOLPIDEM TARTRATE 5 MG PO TABS: 5.0000 mg | ORAL_TABLET | Freq: Every evening | ORAL | Status: DC | PRN

## 2019-06-01 MED ORDER — SCOPOLAMINE 1 MG/3DAYS TD PT72
MEDICATED_PATCH | TRANSDERMAL | Status: AC
  Filled 2019-06-01: qty 1

## 2019-06-01 MED ORDER — OXYCODONE HCL 5 MG PO TABS
5.0000 mg | ORAL_TABLET | ORAL | Status: DC | PRN
  Administered 2019-06-02 (×2): 5 mg via ORAL
  Administered 2019-06-02 – 2019-06-04 (×8): 10 mg via ORAL
  Filled 2019-06-01 (×3): qty 2
  Filled 2019-06-01: qty 1
  Filled 2019-06-01 (×3): qty 2
  Filled 2019-06-01: qty 1
  Filled 2019-06-01 (×2): qty 2

## 2019-06-01 SURGICAL SUPPLY — 36 items
BENZOIN TINCTURE PRP APPL 2/3 (GAUZE/BANDAGES/DRESSINGS) ×3 IMPLANT
CHLORAPREP W/TINT 26ML (MISCELLANEOUS) ×3 IMPLANT
CLAMP CORD UMBIL (MISCELLANEOUS) IMPLANT
CLOSURE WOUND 1/2 X4 (GAUZE/BANDAGES/DRESSINGS) ×1
CLOTH BEACON ORANGE TIMEOUT ST (SAFETY) ×3 IMPLANT
DRSG OPSITE POSTOP 4X10 (GAUZE/BANDAGES/DRESSINGS) ×3 IMPLANT
ELECT REM PT RETURN 9FT ADLT (ELECTROSURGICAL) ×3
ELECTRODE REM PT RTRN 9FT ADLT (ELECTROSURGICAL) ×1 IMPLANT
EXTRACTOR VACUUM M CUP 4 TUBE (SUCTIONS) IMPLANT
EXTRACTOR VACUUM M CUP 4' TUBE (SUCTIONS)
GLOVE BIO SURGEON STRL SZ 6.5 (GLOVE) ×2 IMPLANT
GLOVE BIO SURGEONS STRL SZ 6.5 (GLOVE) ×1
GLOVE BIOGEL PI IND STRL 7.0 (GLOVE) ×1 IMPLANT
GLOVE BIOGEL PI INDICATOR 7.0 (GLOVE) ×2
GOWN STRL REUS W/TWL LRG LVL3 (GOWN DISPOSABLE) ×6 IMPLANT
KIT ABG SYR 3ML LUER SLIP (SYRINGE) IMPLANT
NEEDLE HYPO 25X5/8 SAFETYGLIDE (NEEDLE) IMPLANT
NS IRRIG 1000ML POUR BTL (IV SOLUTION) ×3 IMPLANT
PACK C SECTION WH (CUSTOM PROCEDURE TRAY) ×3 IMPLANT
PAD OB MATERNITY 4.3X12.25 (PERSONAL CARE ITEMS) ×3 IMPLANT
PENCIL SMOKE EVAC W/HOLSTER (ELECTROSURGICAL) ×3 IMPLANT
RTRCTR C-SECT PINK 25CM LRG (MISCELLANEOUS) ×3 IMPLANT
STRIP CLOSURE SKIN 1/2X4 (GAUZE/BANDAGES/DRESSINGS) ×2 IMPLANT
SUT MNCRL 0 VIOLET CTX 36 (SUTURE) ×2 IMPLANT
SUT MONOCRYL 0 CTX 36 (SUTURE) ×6
SUT PLAIN 1 NONE 54 (SUTURE) IMPLANT
SUT PLAIN 2 0 XLH (SUTURE) ×3 IMPLANT
SUT VIC AB 0 CT1 27 (SUTURE) ×6
SUT VIC AB 0 CT1 27XBRD ANBCTR (SUTURE) ×2 IMPLANT
SUT VIC AB 2-0 CT1 27 (SUTURE) ×3
SUT VIC AB 2-0 CT1 TAPERPNT 27 (SUTURE) ×1 IMPLANT
SUT VIC AB 4-0 KS 27 (SUTURE) ×3 IMPLANT
SYR BULB IRRIGATION 50ML (SYRINGE) ×3 IMPLANT
TOWEL OR 17X24 6PK STRL BLUE (TOWEL DISPOSABLE) ×3 IMPLANT
TRAY FOLEY W/BAG SLVR 14FR LF (SET/KITS/TRAYS/PACK) ×3 IMPLANT
WATER STERILE IRR 1000ML POUR (IV SOLUTION) ×3 IMPLANT

## 2019-06-01 NOTE — Transfer of Care (Signed)
Immediate Anesthesia Transfer of Care Note  Patient: Taylor Donovan  Procedure(s) Performed: CESAREAN SECTION (N/A )  Patient Location: PACU  Anesthesia Type:Epidural  Level of Consciousness: awake, alert  and oriented  Airway & Oxygen Therapy: Patient Spontanous Breathing  Post-op Assessment: Report given to RN and Post -op Vital signs reviewed and stable  Post vital signs: Reviewed and stable  Last Vitals:  Vitals Value Taken Time  BP    Temp    Pulse    Resp    SpO2      Last Pain:  Vitals:   06/01/19 0500  TempSrc:   PainSc: Asleep         Complications: No apparent anesthesia complications

## 2019-06-01 NOTE — Lactation Note (Signed)
This note was copied from a baby's chart. Lactation Consultation Note  Patient Name: Taylor Donovan WFUXN'A Date: 06/01/2019 Reason for consult: Initial assessment;Term Type of Endocrine Disorder?: PCOS(hypothyroid  with tx)  Per mom 1st breast feeding experience was challenging and was a difficult latch.  Had challenges with engorgement early on, sore  Nipples and the most able to pump off was 3-4 oz . Worked on the breast feeding and pumping for a few months.  LC reassured mom the [facility] staff and Fara Olden 's would be assisting with latching.  Baby is 5 1/2 hours old  Has only had attempts to latch and spoon fed.  @ consult baby started to wake up and LC checked his diaper and 1st stool - black.  LC placed baby STS on the left breast ( easily hand expressed ) attempted to latch .  Hand expressed 4 ml and baby spoon fed well . 2nd attempt to latch without success .hand expressed a 2nd 4 ml and spoon fed/ baby tolerated well both times.  LC assisted to place baby STS after spoon feeding. LC assured even though baby has stooled x 1 still has some stooling to do and will get hungry.  LC reassured mom baby has time and since he has been spoon fed x 3 it will help to keep his energy level up . LC encouraged mom to hand express between feedings/ save colostrum for when the baby is more awake.  Per mom has a DEBP Spectra at home.  LC provided and instructed mom on the use hand pump . LC gave the NBURN C. Hubbard RN report and asked her to help mom with shells with the IV.  LC recommended due to semi compressible areolas - shells between feedings,  Prior to latching breast massage, hand express, pre- pump to make the nipple / areola complex stretch and then reverse pressure . ( the areola did improve with hand expressing).   The [facility] pamphlet with phone numbers given to mom.     Maternal Data Has patient been taught Hand Expression?: Yes(mom reports several breast changes with 1st trimester and  leaking the last few months) Does the patient have breastfeeding experience prior to this delivery?: Yes  Feeding Feeding Type: Breast Milk  LATCH Score Latch: Too sleepy or reluctant, no latch achieved, no sucking elicited.  Audible Swallowing: None  Type of Nipple: Everted at rest and after stimulation  Comfort (Breast/Nipple): Soft / non-tender  Hold (Positioning): Assistance needed to correctly position infant at breast and maintain latch.  LATCH Score: 5  Interventions Interventions: Breast feeding basics reviewed;Assisted with latch;Skin to skin;Breast massage;Hand express;Reverse pressure;Adjust position;Support pillows;Position options;Expressed milk  Lactation Tools Discussed/Used Tools: Pump;Shells Shell Type: Inverted Breast pump type: Manual WIC Program: No Pump Review: Setup, frequency, and cleaning;Milk Storage Initiated by:: MAI Date initiated:: 06/01/19   Consult Status Consult Status: Follow-up Date: 06/02/19 Follow-up type: In-patient    Matilde Sprang Arnett Duddy 06/01/2019, 12:26 PM

## 2019-06-01 NOTE — Progress Notes (Signed)
Subjective: Postpartum Day 0: Cesarean Delivery Patient reports tolerating PO. Lochia mild. Denies fever, chills, nausea, CP or SOB. Pain well controlled. Has no complaints. Baby sleeping at bedside. Planning circumcision in office  Objective: Vital signs in last 24 hours: Temp:  [97.6 F (36.4 C)-98.6 F (37 C)] 98.5 F (36.9 C) (03/03 0910) Pulse Rate:  [72-130] 76 (03/03 0910) Resp:  [16-25] 17 (03/03 0910) BP: (101-160)/(57-100) 121/76 (03/03 0910) SpO2:  [98 %-100 %] 99 % (03/03 0910)  Physical Exam:  General: alert, cooperative and no distress Lochia: appropriate Uterine Fundus: firm Incision: no significant drainage DVT Evaluation: No evidence of DVT seen on physical exam; SCDs in place  Recent Labs    05/31/19 0757  HGB 11.7*  HCT 35.6*    Assessment/Plan: Status post Cesarean section. Doing well postoperatively.  Continue current care.  Cathrine Muster 06/01/2019, 9:39 AM

## 2019-06-01 NOTE — Consult Note (Signed)
Neonatology Note:   Attendance at C-section:    I was asked by Dr. Bertha Stakes to attend this C/S at term for prolonged decels. The mother is a G2P1, GBS + aIAP with good prenatal care complicated by hypothyroidism, tobacco use, depression, PCOS, and muscular Sclerosis. ROM 16h 70m prior to delivery, fluid clear. Infant vigorous with good spontaneous cry and tone. Needed minimal bulb suctioning. Ap 8/9.   Lungs clear to ausc in DR. Family updated.  To CN to care of Pediatrician.  Dineen Kid Leary Roca, MD

## 2019-06-01 NOTE — Brief Op Note (Signed)
05/31/2019 - 06/01/2019  6:46 AM  PATIENT:  Taylor Donovan  29 y.o. female  PRE-OPERATIVE DIAGNOSIS:  primary c section for fetal heart rate indication and arrest of dilation and decent  POST-OPERATIVE DIAGNOSIS:  primary c section for fetal heart rate indication and arrest of dilation and decent  PROCEDURE:  Procedure(s): CESAREAN SECTION (N/A)  SURGEON:  Surgeon(s) and Role:    * Bovard-Stuckert, Elyan Vanwieren, MD - Primary  FINDINGS: viable female infant at 29:09, apgars  8/9, wt P; nl uterus, tubes and ovaries  ANESTHESIA:   epidural  EBL:  337cc, IVF and uop per anesthesia, clear urine   BLOOD ADMINISTERED:none  DRAINS: Urinary Catheter (Foley)   LOCAL MEDICATIONS USED:  NONE  SPECIMEN:  Source of Specimen:  Placenta  DISPOSITION OF SPECIMEN:  L&D  COUNTS:  YES  TOURNIQUET:  * No tourniquets in log *  DICTATION: .Other Dictation: Dictation Number [id number]  PLAN OF CARE: Admit to inpatient   PATIENT DISPOSITION:  PACU - hemodynamically stable.   Delay start of Pharmacological VTE agent (>24hrs) due to surgical blood loss or risk of bleeding: not applicable

## 2019-06-01 NOTE — Anesthesia Postprocedure Evaluation (Signed)
Anesthesia Post Note  Patient: Taylor Donovan  Procedure(s) Performed: CESAREAN SECTION (N/A )     Patient location during evaluation: PACU Anesthesia Type: Epidural Level of consciousness: awake Pain management: pain level controlled Vital Signs Assessment: post-procedure vital signs reviewed and stable Respiratory status: spontaneous breathing Cardiovascular status: stable Postop Assessment: no headache, no backache, epidural receding, patient able to bend at knees and no apparent nausea or vomiting Anesthetic complications: no    Last Vitals:  Vitals:   06/01/19 1019 06/01/19 1120  BP: 119/79 124/88  Pulse: 77 84  Resp: 18 20  Temp:    SpO2: 98% 98%    Last Pain:  Vitals:   06/01/19 1120  TempSrc:   PainSc: 2    Pain Goal:                   Caren Macadam

## 2019-06-01 NOTE — Progress Notes (Signed)
Patient ID: Taylor Donovan, female   DOB: Aug 26, 1990, 29 y.o.   MRN: [id number]  Comfortable w epidural  CTSP secondary to decels in FHTs.    AFVSS gen NAD FHTs 120's mod var, +accels, some variable, late and early decels, category 2 toco q 2-76min  Resuscitative efforts with position change, IV bolus, pitocin off SVE 7/80/0  D/W pt r/b/a of LTCS.  No questions.   Will continue w close monitoring  Appears pt may be transitioning in labor, will monitor closely

## 2019-06-01 NOTE — Progress Notes (Addendum)
Patient ID: Taylor Donovan, female   DOB: 08-Mar-1991, 29 y.o.   MRN: [id number]   Pt with continued variables and prolonged decel, arrest of dilation'  D/w pt r/b/a of cesarean section.  Questions answered, will proceed  FHTs 140's after 10 minute prolonged decel to 90's. Moderate variability throughout - relieved w position change.   toco q 4 min  Will proceed when OR ready

## 2019-06-02 ENCOUNTER — Encounter (HOSPITAL_COMMUNITY): Payer: Self-pay | Admitting: Obstetrics and Gynecology

## 2019-06-02 LAB — CBC
HCT: 26.8 % — ABNORMAL LOW (ref 36.0–46.0)
Hemoglobin: 8.8 g/dL — ABNORMAL LOW (ref 12.0–15.0)
MCH: 30.7 pg (ref 26.0–34.0)
MCHC: 32.8 g/dL (ref 30.0–36.0)
MCV: 93.4 fL (ref 80.0–100.0)
Platelets: 259 10*3/uL (ref 150–400)
RBC: 2.87 MIL/uL — ABNORMAL LOW (ref 3.87–5.11)
RDW: 13.2 % (ref 11.5–15.5)
WBC: 13.1 10*3/uL — ABNORMAL HIGH (ref 4.0–10.5)
nRBC: 0 % (ref 0.0–0.2)

## 2019-06-02 LAB — BIRTH TISSUE RECOVERY COLLECTION (PLACENTA DONATION)

## 2019-06-02 MED ORDER — DOCUSATE SODIUM 100 MG PO CAPS
100.0000 mg | ORAL_CAPSULE | Freq: Two times a day (BID) | ORAL | Status: DC
  Administered 2019-06-02 – 2019-06-04 (×5): 100 mg via ORAL
  Filled 2019-06-02 (×5): qty 1

## 2019-06-02 MED ORDER — RHO D IMMUNE GLOBULIN 1500 UNIT/2ML IJ SOSY
300.0000 ug | PREFILLED_SYRINGE | Freq: Once | INTRAMUSCULAR | Status: AC
  Administered 2019-06-02: 300 ug via INTRAVENOUS
  Filled 2019-06-02: qty 2

## 2019-06-02 MED ORDER — FERROUS SULFATE 325 (65 FE) MG PO TABS
325.0000 mg | ORAL_TABLET | Freq: Two times a day (BID) | ORAL | Status: DC
  Administered 2019-06-02 – 2019-06-04 (×5): 325 mg via ORAL
  Filled 2019-06-02 (×7): qty 1

## 2019-06-02 MED ORDER — ACETAMINOPHEN 500 MG PO TABS
1000.0000 mg | ORAL_TABLET | Freq: Four times a day (QID) | ORAL | Status: AC
  Administered 2019-06-02 – 2019-06-03 (×4): 1000 mg via ORAL
  Filled 2019-06-02 (×3): qty 2

## 2019-06-02 NOTE — Lactation Note (Signed)
This note was copied from a baby's chart. Lactation Consultation Note Baby 18 hrs old. Mom worried d/t baby not feeding. Baby is gagging and spitting up. LC explained to mom it's normal behavior for newborn. Encouraged hand expression and spoon feeding to remind baby they are hungry. Mom demonstrated hand expression. Mom stated she couldn't get anything from the Rt. Breast. LC massaged breast and nipple, colostrum started coming from Rt. Breast.  Positioned baby in football position to Lt. Breast. Needed t-cup hold to place nipple into baby's mouth. Baby clapped, would suckle a few times then spit out. Another time just held in his mouth then spit put. Mom has flat very compressible nipple. Mom has shells. Strongly encouraged to wear in am. Has hand pump for pre-pumping to evert nipple.  Collected 10 ml and spoon fed to baby. Baby kept spitting colostrum out. Tongue thrusting. Maybe wasted approx. 1 ml  Baby not interested in BF at this time. Encouraged to cont. To hold STS at times, and rest when baby is resting. Call for further assistance or questions.  Patient Name: Taylor Donovan [id number] Date: 06/02/2019 Reason for consult: Follow-up assessment;Mother's request;Term;Maternal endocrine disorder Type of Endocrine Disorder?: PCOS   Maternal Data    Feeding Feeding Type: Breast Milk  LATCH Score Latch: Too sleepy or reluctant, no latch achieved, no sucking elicited.  Audible Swallowing: None  Type of Nipple: Flat  Comfort (Breast/Nipple): Soft / non-tender  Hold (Positioning): Full assist, staff holds infant at breast  LATCH Score: 3  Interventions Interventions: Breast feeding basics reviewed;Support pillows;Assisted with latch;Position options;Skin to skin;Expressed milk;Breast massage;Hand express;Pre-pump if needed;Breast compression;Adjust position;Hand pump;Shells  Lactation Tools Discussed/Used Tools: Shells;Pump Shell Type: Inverted Breast pump type:  Manual   Consult Status Consult Status: Follow-up Date: 06/02/19 Follow-up type: In-patient    Charyl Dancer 06/02/2019, 12:32 AM

## 2019-06-02 NOTE — Progress Notes (Signed)
POSTPARTUM POSTOP PROGRESS NOTE  POD #1  Subjective:  No acute events overnight.  Pt denies problems with ambulating, voiding or po intake.  She denies nausea or vomiting.  Pain is well controlled.  She has had flatus. She has not had bowel movement.  Lochia Minimal. Baby has issue with latching but takes colustrum/milk from teaspoon.   Objective: Blood pressure (!) 110/59, pulse 73, temperature 98.1 F (36.7 C), temperature source Oral, resp. rate 18, height 5\' 7"  (1.702 m), weight 109.2 kg, last menstrual period 08/26/2018, SpO2 98 %, unknown if currently breastfeeding.  Physical Exam:  General: alert, cooperative and no distress Lochia:normal flow Chest: CTAB Heart: RRR no m/r/g Abdomen: +BS, soft, nontender Uterine Fundus: firm, 2cm below umbilicus, difficult 2/2 habitus. Honeycomb dressing intact, minimal drainage Extremities: neg edema, neg calf TTP BL, neg Homans BL  Recent Labs    05/31/19 0757 06/02/19 0541  HGB 11.7* 8.8*  HCT 35.6* 26.8*    Assessment/Plan:  ASSESSMENT: Taylor Donovan is a 29 y.o. G2P1001 s/p PTLCS @ [redacted]w[redacted]d for arrest of dilation. PNC c/b hypothyroidism on meds, tobacco use, RH neg.   Breastfeeding and Lactation consult  Circumcision in office Continue ambulation, incentive spirometry Postop anemia -asx, will start FeSO4/colace at this time   LOS: 2 days

## 2019-06-02 NOTE — Progress Notes (Signed)
CSW consulted as MOB has a history of PPD with her oldest chid. CSW went to speak with  MOB at bedside to address further needs.   CSW congratulated MOB and FOB on the birth of infant. CSW advsied MOB of CSWs role and the reason for CSW coming to visit with her MOB reported that she was diagnosed  PPD with her daughter in 2011. MOB reported that she doesn't think that's he had PPD but was dealing with a lot from daughters father, in him not being very supportive and helpful for her. MOB reported that's he was placed on Lexapro at that time and has been fien since. MOB reported that she hasn't had any signs or symptoms for depression in awhile and reports that she is excited that infant is here.   CSW inquired from MOB on any other mental health diagnosis that she has. CSW was advised that she doesn't have any other diagnosis and reports that she has all needed all item to care for infant with no other needs.    CSW educated MOB about PPD. CSW informed MOB of possible supports and interventions to decrease PPD.  CSW also encouraged MOB to seek medical attention if needed for increased signs and symptoms for PPD. CSW provided MOB with PPD and SIDS education. MOB reported that she has no other needs.     Taylor Donovan, MSW, LCSW [facility] [place] [phone]  

## 2019-06-03 LAB — RH IG WORKUP (INCLUDES ABO/RH)
ABO/RH(D): O NEG
Fetal Screen: NEGATIVE
Gestational Age(Wks): 39.6
Unit division: 0

## 2019-06-03 NOTE — Progress Notes (Addendum)
Subjective: Postpartum Day 2: Cesarean Delivery Patient reports incisional pain, tolerating PO and + BM.  She is sore with walking, but no dizziness at all.  Baby on biliblanket  Objective: Vital signs in last 24 hours: Temp:  [97.7 F (36.5 C)-98.8 F (37.1 C)] 97.9 F (36.6 C) (03/05 0602) Pulse Rate:  [70-79] 73 (03/05 0602) Resp:  [18-19] 19 (03/05 0602) BP: (105-134)/(71-73) 132/71 (03/05 0602) SpO2:  [99 %-100 %] 99 % (03/05 0602)  Physical Exam:  General: alert and cooperative Lochia: appropriate Uterine Fundus: firm Incision: C/D/I   Recent Labs    06/02/19 0541  HGB 8.8*  HCT 26.8*    Assessment/Plan: Status post Cesarean section. Doing well postoperatively.  Pt states may want to go home this pm, but advised her peds may want baby to stay given the jaundice.  Will see if peds clears for d/c or plan tomorrow Circumcision planned in office Oliver Pila 06/03/2019, 9:20 AM

## 2019-06-03 NOTE — Lactation Note (Signed)
This note was copied from a baby's chart. Lactation Consultation Note  Patient Name: [name] [id number] Date: 06/03/2019  Mom and dad in room.  Infant sleeping.  Mom reports at his last feeding she just gave him a bottle with 70 mls breastmilk that was at 12:30 pm via bottle. Mom reports he will not latch without the nipple shield.  Mom reports they tried a 24 mm nipple shield but he did not like it that he is doing pretty well with a 20 mm nipple shield.  Mom reports they are very happy that her milk is coming in more and he has come off some of his phototheraphy. Discussed pumping every time she offers a bottle and does not breastfeed. Discussed pumping after breastfeedings maybe every feed or at least every other feed and feeding back expressed mothers milk..  Reviewed amounts sheet with mom and gave it to her for her to have a reference. Did not discuss pumping with nipple shield because they are already pumping with phototheraphy. Discussed always trying to breastfeed first without the nipple shield and using it if he will not latch. Discussed appropriate application of the nipple shield. Urged to try and breastfeed first then if he will not feed with or without the nipple shield to offer bottle Praised mom's breastfeeding efforts.  Urged her to call lactation as needed.       Maternal Data    Feeding Feeding Type: Breast Milk  LATCH Score                   Interventions    Lactation Tools Discussed/Used     Consult Status      Taylor Donovan Taylor Donovan 06/03/2019, 2:07 PM

## 2019-06-03 NOTE — Lactation Note (Signed)
This note was copied from a baby's chart. Lactation Consultation Note Parents sleeping soundly. Baby on DPT. Will attempt to see again.  Patient Name: Taylor Donovan [id number] Date: 06/03/2019     Maternal Data    Feeding Feeding Type: Breast Fed  LATCH Score                   Interventions    Lactation Tools Discussed/Used     Consult Status      Charyl Dancer 06/03/2019, 2:48 AM

## 2019-06-04 MED ORDER — OXYCODONE HCL 5 MG PO TABS: 5.0000 mg | ORAL_TABLET | ORAL | 0 refills | Status: DC | PRN

## 2019-06-04 MED ORDER — IBUPROFEN 200 MG PO TABS: 600.0000 mg | ORAL_TABLET | Freq: Four times a day (QID) | ORAL | 0 refills | Status: DC | PRN

## 2019-06-04 MED ORDER — LACTATED RINGERS IV SOLN: 500.0000 mL | Freq: Once | INTRAVENOUS | Status: DC

## 2019-06-04 NOTE — Discharge Summary (Signed)
OB Discharge Summary     Patient Name: Taylor Donovan DOB: 1990-04-01 MRN: [id number]  Date of admission: 05/31/2019 Delivering MD: Janyth Contes   Date of discharge: 06/04/2019  Admitting diagnosis: Normal pregnancy in multigravida in third trimester [Z34.83] S/P cesarean section [Z98.891] Intrauterine pregnancy: [redacted]w[redacted]d     Secondary diagnosis:  Principal Problem:   S/P cesarean section Active Problems:   Normal pregnancy in multigravida in third trimester  Additional problems: Hypothyroidism                                      GBS positive     Discharge diagnosis: Term Pregnancy Delivered                                                                                                Post partum procedures:rhogam  Augmentation: AROM and Pitocin  Complications: None  Hospital course:  Induction of Labor With Cesarean Section  29 y.o. yo G2P1001 at [redacted]w[redacted]d was admitted to the hospital 05/31/2019 for induction of labor. Patient had a labor course significant for arrest of dilation and category 2 tracing. The patient went for cesarean section due to Arrest of Dilation and category 2 tracing, and delivered a Viable infant,06/01/2019  Membrane Rupture Time/Date: 1:51 PM ,05/31/2019   Details of operation can be found in separate operative Note.  Patient had an uncomplicated postpartum course. She is ambulating, tolerating a regular diet, passing flatus, and urinating well.  Patient is discharged home in stable condition on 06/04/19.                                    Physical exam  Vitals:   06/03/19 0602 06/03/19 1408 06/03/19 2247 06/04/19 0625  BP: 132/71 113/75 118/80 119/80  Pulse: 73 79 80 78  Resp: 19 17  18   Temp: 97.9 F (36.6 C) 98.2 F (36.8 C) 97.6 F (36.4 C) 97.8 F (36.6 C)  TempSrc: Oral Oral Oral Oral  SpO2: 99% 99%    Weight:      Height:       General: alert and cooperative Lochia: appropriate Uterine Fundus: firm Incision: Dressing is clean, dry, and  intact DVT Evaluation: No evidence of DVT seen on physical exam. Labs: Lab Results  Component Value Date   WBC 13.1 (H) 06/02/2019   HGB 8.8 (L) 06/02/2019   HCT 26.8 (L) 06/02/2019   MCV 93.4 06/02/2019   PLT 259 06/02/2019   CMP Latest Ref Rng & Units 03/15/2017  Glucose 65 - 99 mg/dL 79  BUN 6 - 20 mg/dL 10  Creatinine 0.44 - 1.00 mg/dL 0.67  Sodium 135 - 145 mmol/L 138  Potassium 3.5 - 5.1 mmol/L 4.0  Chloride 101 - 111 mmol/L 107  CO2 22 - 32 mmol/L 27  Calcium 8.9 - 10.3 mg/dL 8.9  Total Protein 6.5 - 8.1 g/dL 7.1  Total Bilirubin 0.3 - 1.2 mg/dL 0.5  Alkaline Phos 38 -  126 U/L 60  AST 15 - 41 U/L 15  ALT 14 - 54 U/L 15    Discharge instruction: per After Visit Summary and "Baby and Me Booklet".  After visit meds:  Allergies as of 06/04/2019   No Known Allergies     Medication List    STOP taking these medications   diphenhydrAMINE 25 MG tablet Commonly known as: BENADRYL     TAKE these medications   acetaminophen 325 MG tablet Commonly known as: TYLENOL Take 650 mg by mouth every 6 (six) hours as needed for mild pain or headache.   famotidine 20 MG tablet Commonly known as: PEPCID Take 20 mg by mouth 2 (two) times daily.   ibuprofen 200 MG tablet Commonly known as: Advil Take 3 tablets (600 mg total) by mouth every 6 (six) hours as needed.   levothyroxine 125 MCG tablet Commonly known as: SYNTHROID Take 250 mcg by mouth daily before breakfast.   oxyCODONE 5 MG immediate release tablet Commonly known as: Oxy IR/ROXICODONE Take 1-2 tablets (5-10 mg total) by mouth every 4 (four) hours as needed for moderate pain.       Diet: routine diet  Activity: Advance as tolerated. Pelvic rest for 6 weeks.   Outpatient follow up:2 weeks Follow up Appt: Future Appointments  Date Time Provider Department Center  07/07/2019  3:30 PM Sater, Pearletha Furl, MD [facility] None   Follow up Visit:No follow-ups on file.  Postpartum contraception: Undecided  Newborn  Data: Live born female  Birth Weight: 7 lb 15.8 oz (3623 g) APGAR: 8, 9  Newborn Delivery   Birth date/time: 06/01/2019 06:09:00 Delivery type: C-Section, Low Transverse C-section categorization: Primary      Baby Feeding: Breast Disposition:home with mother  Plans circumcision in office and knows to call Monday 06/06/19 to pay and set up   06/04/2019 Oliver Pila, MD

## 2019-06-04 NOTE — Lactation Note (Signed)
This note was copied from a baby's chart. Lactation Consultation Note  Patient Name: Taylor Donovan [id number] Date: 06/04/2019 Reason for consult: Follow-up assessment;Term;Hyperbilirubinemia;Maternal endocrine disorder Type of Endocrine Disorder?: Thyroid(+PCOS)  [id number] F/U visit with P2 mom who delivered @ 39.6wks, baby is now 51 days old w/ hx of jaundice and treated with double phototherapy. Anticipated discharge today  LC entered room to find mom in bed with baby, feeding with nipple shield in place. Mom denies any pain/discomfort with latch. Mom states she just pumped about an hour ago and FOB bottlefed the baby but baby is cuing now so mom put baby to breast. Mom with baby in cross-cradle hold on right breast. Mom states she has a Spectra II pump at home.  Mom with excellent latch technique. Reviewed nose/chin touching breast during feeding, lips flanged, body alignment. Demonstrated chin tug as infant's bottom lip not quite flanged out enough. Infant aggressive at the breast and swallows audible, milk noted in shield. Mom states she can only get baby latched using shield; has tried without shield at the start and also during the feeding. Reviewed nipple shield use designed for short-term use ideally. Offered to request outpatient phone call next week due to mom going home and using nipple shield consistently; mom agrees. LC will send message. Encouraged mom to use support pillows to bring baby to the level of her breast.  Mom states her nipples a little sore but not uncomfortable. Mom with personal nipple butter product at bedside that she is using after feedings. Encouraged mom to use EBM as well and also coconut oil if desired.  Reviewed proper flange fit with her Spectra pump at home. Encouraged mom to take all her Medela DEBP pieces home with her. Reviewed milk storage guidelines and encouraged to use MB handbook as a resource. Reviewed prevention of engorgement and treatment  with ice and pre-pumping as necessary. Encouraged mom to alternate positions as well in order to drain the breast effectively. (Mom using cross-cradle on right breast and football on left.) Reviewed expected output of baby in the early days. Reviewed lactation brochure phone numbers and outpatient lactation services. FOB entered room near end of consult but very engaged and supportive of mom with breastfeeding.  Maternal Data Does the patient have breastfeeding experience prior to this delivery?: Yes  Feeding Feeding Type: Breast Fed  LATCH Score Latch: Grasps breast easily, tongue down, lips flanged, rhythmical sucking.  Audible Swallowing: Spontaneous and intermittent  Type of Nipple: Flat  Comfort (Breast/Nipple): Soft / non-tender  Hold (Positioning): No assistance needed to correctly position infant at breast.  LATCH Score: 9  Interventions Interventions: Breast feeding basics reviewed;Hand express;Expressed milk;Coconut oil;Pre-pump if needed;Position options;Support pillows;Hand pump;DEBP  Lactation Tools Discussed/Used Tools: Nipple Shields Breast pump type: Double-Electric Breast Pump;Manual Pump Review: Setup, frequency, and cleaning;Milk Storage   Consult Status Consult Status: Follow-up Date: 06/07/19 Follow-up type: Telephone Call(LC will enter request)    Virgia Land 06/04/2019, 11:30 AM

## 2019-06-04 NOTE — Progress Notes (Signed)
Subjective: Postpartum Day 3: Cesarean Delivery Patient reports tolerating PO, + BM and no problems voiding.    Objective: Vital signs in last 24 hours: Temp:  [97.6 F (36.4 C)-98.2 F (36.8 C)] 97.8 F (36.6 C) (03/06 0625) Pulse Rate:  [78-80] 78 (03/06 0625) Resp:  [17-18] 18 (03/06 0625) BP: (113-119)/(75-80) 119/80 (03/06 0625) SpO2:  [99 %] 99 % (03/05 1408)  Physical Exam:  General: alert and cooperative Lochia: appropriate Uterine Fundus: firm Incision: C/D/I   Recent Labs    06/02/19 0541  HGB 8.8*  HCT 26.8*    Assessment/Plan: Status post Cesarean section. Doing well postoperatively.  Discharge home with standard precautions and return to office in 2 weeks.  Oliver Pila 06/04/2019, 10:50 AM

## 2019-06-07 NOTE — Op Note (Signed)
NAMECOLUMBIA, PANDEY MEDICAL RECORD [id number] ACCOUNT [id number] DATE OF BIRTH:[birth year] FACILITY: [facility] LOCATION: [facility] PHYSICIAN:Nitzia Perren BOVARD-STUCKERT, MD  OPERATIVE REPORT  DATE OF PROCEDURE:  06/01/2019  PREOPERATIVE DIAGNOSES:  Persistent category 2 tracing, arrest of dilatation.  POSTOPERATIVE DIAGNOSES:  Persistent category 2 tracing, arrest of dilatation.  PROCEDURE:  Primary low transverse cesarean section.  SURGEON:  Sherron Monday, MD   FINDINGS:  Viable female infant at 6:09 a.m. with Apgars of 8 at 1 minute and 9 at five minutes and weight pending at this time.  Normal uterus, tubes and ovaries are noted.  ANESTHESIA:  Epidural.  ESTIMATED BLOOD LOSS:  Approximately 337 mL.  INTRAVENOUS FLUIDS:  Per anesthesia.  URINE OUTPUT:  Per anesthesia.  Clear urine at the end of the procedure.  COMPLICATIONS:  None.  DESCRIPTION OF PROCEDURE:  After informed consent was reviewed with the patient including risks, benefits and alternatives of the surgical procedure, she was transported to the OR.  Her epidural had been dosed.  It was tested and found to be adequate.   She was then prepped and draped in the normal sterile fashion.  Foley catheter had previously been placed.  After an appropriate timeout, a Pfannenstiel skin incision was made at the level of approximately 2 fingerbreadths above the pubic symphysis,  carried through to the underlying layer of fascia sharply.  The fascia was incised in the midline.  The midline, incision was extended laterally with Mayo scissors.  Superior aspect of the fascial incision was grasped with Kocher clamps, elevated and the  rectus muscles were dissected off both bluntly and sharply.  Midline was easily identified.  Peritoneum was entered bluntly.  The incision was extended superiorly and inferiorly with good visualization of the bladder.  An Alexis skin retractor was  placed carefully making sure that no bowel was entrapped.  Uterus was  incised in a transverse fashion.  The infant was delivered from a vertex presentation.  Nose and mouth were suctioned on the field.  Cord was clamped and cut after waiting a minute.   During this time, the baby was minimally stimulated.  The infant was handed off to the awaiting pediatric staff.  The placenta was expressed.  Uterus was cleared of all clot and debris.  Uterine incision was closed with 2 layers of 0 Monocryl, 1st of  which was running locked and the 2nd as an imbricating layer.  Hemostasis was otherwise assured.  The Alexis retractor was removed.  The peritoneum was reapproximated with 2-0 Vicryl in a running fashion.  The subfascial planes were inspected and found  to be hemostatic.  The fascia was reapproximated with 0 Vicryl.  The subcuticular adipose layer was made hemostatic with Bovie cautery and the dead space was closed with plain gut.  The skin was reapproximated with 4-0 Vicryl on a Keith needle.  Benzoin  and Steri-Strips were applied to the incision.  The patient tolerated the procedure well.  Sponge, lap and needle counts were correct x2 at the end of the procedure per the operating room staff.  CN/NUANCE  D:06/07/2019 T:06/07/2019 JOB:[id number]

## 2019-07-07 ENCOUNTER — Encounter: Payer: Self-pay | Admitting: Neurology

## 2019-07-07 ENCOUNTER — Ambulatory Visit (INDEPENDENT_AMBULATORY_CARE_PROVIDER_SITE_OTHER): Payer: 59 | Admitting: Neurology

## 2019-07-07 ENCOUNTER — Other Ambulatory Visit: Payer: Self-pay

## 2019-07-07 VITALS — BP 129/89 | HR 74 | Ht 67.0 in | Wt 217.0 lb

## 2019-07-07 DIAGNOSIS — Z79899 Other long term (current) drug therapy: Secondary | ICD-10-CM | POA: Diagnosis not present

## 2019-07-07 DIAGNOSIS — G35 Multiple sclerosis: Secondary | ICD-10-CM

## 2019-07-07 DIAGNOSIS — E559 Vitamin D deficiency, unspecified: Secondary | ICD-10-CM | POA: Diagnosis not present

## 2019-07-07 DIAGNOSIS — Z98891 History of uterine scar from previous surgery: Secondary | ICD-10-CM

## 2019-07-07 DIAGNOSIS — G4700 Insomnia, unspecified: Secondary | ICD-10-CM

## 2019-07-07 NOTE — Progress Notes (Signed)
[facility]  PATIENT: Taylor Donovan DOB: [birth year]  REFERRING DOCTOR OR PCP:  Vicenta Aly, FNP SOURCE: patient, notes from ED/hospital stay, lab results, imaging results, MRI images on PACS  _________________________________   HISTORICAL  CHIEF COMPLAINT:  Chief Complaint  Patient presents with  . Follow-up    RM 13, alone    HISTORY OF PRESENT ILLNESS:  Taylor Donovan is a 29 y.o. woman with MS reporting new left sided visual changes  Update 07/07/2019: She delivered a healthy boy 05/31/2019 by C-section after 22 hours labor.   She had been on Tecfidera and was generally tolerating it well without GI upset and occasional flushing.   She has no recent MS exacerbation.        Gait is doing well.  No falls but som tripping -- mostly right leg.   She denies weakness in her legs.   No dysesthesias in her legs.Vision is doing well.   Bladder function is fine.   She sleeps better than she did during her pregnancy.    Mood is fine.  Cognition is doing well.      She is planning to breastfeed until her son is 6 months.  We discussed the results of the [place] breastfeeding study where a small amount of MMF was found in breast milk but at fairly low levels.  The peak was 2 hours after taking Tecfidera and lowest at 12 hours.       Update 12/21/2018: She is currently [redacted] weeks pregnant.  She will find out the gender of the baby soon.  Her due date is 06/02/2019.  She stopped the Tecfidera due to the pregnancy and has not had any exacerbations and has no new MS symptoms.   She had tolerated Tecfidera well and had no GI issues or flushing.  She stopped her other medications as well.  Her gait is doing well.  She did pull a muscle in her back and had a lot of stiffness for a few days.   She still feels sore but not as stiff.   She denies arm or leg weakness or numbness. No vision issues.   She has some bladder urgency.  She has noted more fatigue the past month.   She is  sleeping better though (getting 8 hours nightly).   She has had thyroid dysfunction and has needed higher doses of synthroid.    He shoulder pain resolved  Update 06/16/2018: She is doing well on Tecfidera and has no exacerbations recently or new symptoms.     Her gait is doing well.    She feels clumsier but just a little worse.    She sometimes hits the walls or door jambs.     She occ trips.   Strength and spasticity are doing well.   Neck, hips and lower back are hurting a little more.    She also has some neck pain and stiffness.   She is on gabapentin and baclofen.    She does not take NSAIDs regularly but will occ take 800 mg Ibu.    She notes some numbness in hands and feet.     Bladder urgency is mildly worse.    Vision is doing well though she has some light sensitivity.    Fatigue occurs daily and is just a little better after leaving work.   She has insomnia and trazodone helps slightly but if in more pain does worse.      The subacromial bursa injection helped  a lot.    Update 12/16/2017: She feels her MS has been stable for the most part and there are no exacerbations.   She is tolerating the Tecfidera well and currently has no side effects.,    She denies difficulty with her gait and no weakness.   She has some numbness in her feet, bilateral.  This is most noticeable with driving, even short distances.   She is on gabapentin and that helps some.   She takes 900 mg nightly.    She has difficulty falling asleep and staying asleep.   Sometimes she has trouble getting comfortable and other times she is not sure why she has insomnia.   She is still on baclofen 10 mg po bid.    She notes some fatigue, worse if she slept poorly.  Fatigue is better since changing jobs.   She was on phentermine and felt it helped her a little bit.   Taking a nap helps.  She does not snore.      Mood is doing well.   She notes some difficulty with her short term memory and focus/attention.     The right  shoulder is more painful and ROM is reduced, especially elevation.   Update 08/19/2017: She had the onset of laryngitis about 3 weeks.   She was tested for flu but not for strep throat.  She was placed on Amoxicillin.    She felt her throat infection was improving last week.   She went back to work over the weekend.      She was fine Saturday but on Sunday, as the evening went on, she had much more pain, mostly in the ankles and knees.   She also noted more tingling in her toes and a tremor.     She notes that increasing the gabapentin helped the tingling in the feet but not the leg pain.   The baclofen is helping her muscle spasms and also helping sleep.     No definite exacerbation.   She tolerates Tecfidera well.    Update 06/10/2017: She feels her MS is stable,     She is on Tecfidera and she tolerates it well.   She denies exacerbations.    No abdominal symptoms or flushing.     She has more tenderness in her legs, right > left.   She notes some stiffness/spasticity.  Pain is achy.   She notes it more when she is not using.   It sometimes bothers her at night.    Flexeril at night did not help much.   She has not tried baclofen.    She is walking well and denies weakness.    She was having groin numbness and pain, helped by lamotrigine.    She started doing better and stopped the lamotrigine.   She is still on gabapentin.    Bladder is doing well on oxybutynin.         She has fatigue. She sometimes works 11-12 hours.    At first phentermine was helping but she felt it helped less after a few months.   Mood is doing better on escitalopram.     From 12/10/2016: Groin pain is better since starting lamotrigine.     However, her hips and lower back are hurting more.   Pain fluctuates but is not necessarily worse with changes in position.     LBP does not radiate into the legs.  She is noting some verbal fluency issues and STM  seems a little worse.  Attention and focus seem worse.    She is often  sleepy and notes fatigue and sometimes takes naps.    She is waking up more at night, sometimes to urinate and sometimes randomly.     After the left optic neuritis in March, vision improved again.   Colors are washed out a bit.     She was having some allergies affecting her eyes and she was advised to take a Benadryl at bedtime.     __________________   She is tolerating Tecfidera well and she denies any exacerbation.     About 10 days ago, she had the onset of left visual blurriness associated with left eye pain.    Pain worseend with moving her eyes.   She had flu-like symptoms and neck stiffness with her IV steroid treatment.    She had similar but milder symptoms the first time she was on IV Steroids.   Etodolac and Flexeril helped her myalgias.     MS:   She did not qualify for a drug study and we have had trouble getting her started on Tecfidera.   She does not feel she could self inject and so can not be on interferon or glatiramer.  We were finally able to get approval for Tecfidera and she should be getting her first shipment within a week.    Gait/strength/sensation: She feels mildly clumsy with her gait but has no falls. 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  . She denies any significant weakness or numbness in the arms or legs.  Bladder/bowel: She notes urinary frequency with extreme urgency a times and has nocturia twice most nights.  She has rare incontinence when she can't get to bathroom in time (happened once at work).   She notes some constipation.  Fatigue/sleep: She notes fatigue on a daily basis that is worse as the day goes on. She always feels sleepy. She falls asleep easily and will get a couple hours of sleep. However, once she wakes up she has difficulty falling back asleep. This has been present the last year. He did worse while on steroids.   She denies RLS.    Mood/Cognition: She denies much depression or  anxiety. She has no major cognitive issues but she has had some problems with verbal fluency. The difficulties with verbal fluency began this year.  MS History:   She presented with left optic neuritis in October 2017.   MRI of the brain showed several other foci, worrisome for multiple sclerosis. One of the foci is in the right middle cerebellar peduncle and a couple of the foci are periventricular. I personally reviewed the MRI of the brain dated 01/07/2016. There are several T2/FLAIR hyperintense foci, one is in the right middle cerebellar peduncle and a couple are in the periventricular white matter. Other foci are in the subcortical deep white matter. None of the foci enhanced after contrast admission to the hospital.   I also reviewed the MRI of the orbits performed that day. That MRI was normal.   I reviewed the admission and discharge notes from her emergency room visit and hospital stay this month.   TSH was elevated and B12 was reduced.   ESR, CRP, RPR, Lyme Abs/DNA, ANA were negative.     REVIEW OF SYSTEMS: Constitutional: No fevers, chills, sweats, or change in appetite. She notes fatigue and some sleepiness. Eyes: as above Ear, nose and  throat: No hearing loss, ear pain, nasal congestion, sore throat Cardiovascular: No chest pain, palpitations Respiratory: No shortness of breath at rest or with exertion.   No wheezes.   She snores. GastrointestinaI: No nausea, vomiting, diarrhea, abdominal pain, fecal incontinence Genitourinary: No dysuria, urinary retention or frequency.  2 x nocturia. Musculoskeletal: No neck pain, back pain Integumentary: No rash, pruritus, skin lesions Neurological: as above Psychiatric: Notes some depression better on Lexapro.  No anxiety Endocrine: No palpitations, diaphoresis, change in appetite, change in weigh or increased thirst Hematologic/Lymphatic: No anemia, purpura, petechiae. Allergic/Immunologic: No itchy/runny eyes, nasal congestion, recent  allergic reactions, rashes  ALLERGIES: No Known Allergies  HOME MEDICATIONS:  Current Outpatient Medications:  .  acetaminophen (TYLENOL) 325 MG tablet, Take 650 mg by mouth every 6 (six) hours as needed for mild pain or headache., Disp: , Rfl:  .  ibuprofen (ADVIL) 200 MG tablet, Take 3 tablets (600 mg total) by mouth every 6 (six) hours as needed., Disp: 30 tablet, Rfl: 0 .  paragard intrauterine copper IUD IUD, 1 each by Intrauterine route once., Disp: , Rfl:  .  levothyroxine (SYNTHROID) 125 MCG tablet, Take 250 mcg by mouth daily before breakfast., Disp: , Rfl:   PAST MEDICAL HISTORY: Past Medical History:  Diagnosis Date  . Acid reflux   . Depression    pp  . Hypothyroid   . Multiple sclerosis ([place])   . Ovarian cyst   . PCOS (polycystic ovarian syndrome)   . S/P cesarean section 06/01/2019  . Thyroid disease   . Vision abnormalities     PAST SURGICAL HISTORY: Past Surgical History:  Procedure Laterality Date  . CESAREAN SECTION N/A 06/01/2019   Procedure: CESAREAN SECTION;  Surgeon: Janyth Contes, MD;  Location: [facility];  Service: Obstetrics;  Laterality: N/A;    FAMILY HISTORY: Family History  Problem Relation Age of Onset  . Hypertension Mother   . Diabetes Mellitus II Mother   . Healthy Father   . Healthy Maternal Aunt   . Healthy Maternal Grandmother     SOCIAL HISTORY:  Social History   Socioeconomic History  . Marital status: Married    Spouse name: Not on file  . Number of children: Not on file  . Years of education: Not on file  . Highest education level: Not on file  Occupational History  . Not on file  Tobacco Use  . Smoking status: Current Every Day Smoker    Packs/day: 0.50    Years: 8.00    Pack years: 4.00    Types: Cigarettes  . Smokeless tobacco: Never Used  Substance and Sexual Activity  . Alcohol use: Yes    Comment: occasionally  . Drug use: No  . Sexual activity: Yes    Birth control/protection: Pill  Other Topics  Concern  . Not on file  Social History Narrative  . Not on file   Social Determinants of Health   Financial Resource Strain:   . Difficulty of Paying Living Expenses:   Food Insecurity:   . Worried About Charity fundraiser in the Last Year:   . Arboriculturist in the Last Year:   Transportation Needs:   . Film/video editor (Medical):   Marland Kitchen Lack of Transportation (Non-Medical):   Physical Activity:   . Days of Exercise per Week:   . Minutes of Exercise per Session:   Stress:   . Feeling of Stress :   Social Connections:   . Frequency of  Communication with Friends and Family:   . Frequency of Social Gatherings with Friends and Family:   . Attends Religious Services:   . Active Member of Clubs or Organizations:   . Attends Archivist Meetings:   Marland Kitchen Marital Status:   Intimate Partner Violence:   . Fear of Current or Ex-Partner:   . Emotionally Abused:   Marland Kitchen Physically Abused:   . Sexually Abused:      PHYSICAL EXAM  Vitals:   07/07/19 1506  BP: 129/89  Pulse: 74  Weight: 217 lb (98.4 kg)  Height: '5\' 7"'$  (1.702 m)    Body mass index is 33.99 kg/m.   General: The patient is well-developed and well-nourished and in no acute distress.   Musculoskeletal: She has tenderness over the trochanteric bursae Neurologic Exam  Mental status: The patient is alert and oriented x 3 at the time of the examination. The patient has apparent normal recent and remote memory, with an apparently normal attention span and concentration ability.   Speech is normal.  Cranial nerves: Extraocular movements are full. reduced left eye visual acuity and color vision.   Facial strength and sensation is normal. Trapezius strength is normal. No dysarthria is noted.  The tongue is midline, and the patient has symmetric elevation of the soft palate. No obvious hearing deficits are noted.  Motor:  Muscle bulk is normal.   Muscle tone and strength are fine.  Sensory: She reported symmetric  touch and vibration sensation in the arms and legs.  Coordination: Cerebellar testing shows good finger-nose-finger and heel-to-shin bilaterally..  Gait and station: Station is normal.  Gait is normal.  Tandem gait is mildly wide.  Romberg is negative.    Reflexes: Deep tendon reflexes are symmetric and normal bilaterally.       DIAGNOSTIC DATA (LABS, IMAGING, TESTING) - I reviewed patient records, labs, notes, testing and imaging myself where available.  Lab Results  Component Value Date   WBC 13.1 (H) 06/02/2019   HGB 8.8 (L) 06/02/2019   HCT 26.8 (L) 06/02/2019   MCV 93.4 06/02/2019   PLT 259 06/02/2019      Component Value Date/Time   NA 138 03/15/2017 1942   K 4.0 03/15/2017 1942   CL 107 03/15/2017 1942   CO2 27 03/15/2017 1942   GLUCOSE 79 03/15/2017 1942   BUN 10 03/15/2017 1942   CREATININE 0.67 03/15/2017 1942   CALCIUM 8.9 03/15/2017 1942   PROT 7.1 03/15/2017 1942   ALBUMIN 4.2 03/15/2017 1942   AST 15 03/15/2017 1942   ALT 15 03/15/2017 1942   ALKPHOS 60 03/15/2017 1942   BILITOT 0.5 03/15/2017 1942   GFRNONAA >60 03/15/2017 1942   GFRAA >60 03/15/2017 1942   No results found for: CHOL, HDL, LDLCALC, LDLDIRECT, TRIG, CHOLHDL      ASSESSMENT AND PLAN  Multiple sclerosis ([place]) - Plan: CBC with Differential/Platelet, Hepatic function panel, MR BRAIN WO CONTRAST  High risk medication use - Plan: CBC with Differential/Platelet, Hepatic function panel  Vitamin D deficiency - Plan: VITAMIN D 25 Hydroxy (Vit-D Deficiency, Fractures)  S/P cesarean section  Insomnia, unspecified type    1.     Restart Tecfidera or Vumerity.  I discussed with her the results of a very small study of 2 others showing that the amount of tachycardia or the passage of breastmilk was small.  The maximum amount was 2 hours after breast-feeding so she should pump right before each dose and then wait wait at least 4  hours after taking her medication before pumping again.  I will  check some blood work and also check an MRI of the brain to determine if she is having subclinical activity.. 2.    Continue off other medications 3.   Stay active and exercise 4.   Return in 5-6 months or sooner if there are new or worsening neurologic symptoms.   Dailah Opperman A. Felecia Shelling, MD, PhD 04/08/4172, 0:81 PM Certified in Neurology, Clinical Neurophysiology, Sleep Medicine, Pain Medicine and Neuroimaging  [facility] [address] [place], [place] [address] [phone]

## 2019-07-08 ENCOUNTER — Other Ambulatory Visit: Payer: Self-pay | Admitting: Neurology

## 2019-07-08 LAB — CBC WITH DIFFERENTIAL/PLATELET
Basophils Absolute: 0 10*3/uL (ref 0.0–0.2)
Basos: 0 %
EOS (ABSOLUTE): 0.2 10*3/uL (ref 0.0–0.4)
Eos: 4 %
Hematocrit: 37.8 % (ref 34.0–46.6)
Hemoglobin: 12.9 g/dL (ref 11.1–15.9)
Immature Grans (Abs): 0 10*3/uL (ref 0.0–0.1)
Immature Granulocytes: 0 %
Lymphocytes Absolute: 1.2 10*3/uL (ref 0.7–3.1)
Lymphs: 22 %
MCH: 30 pg (ref 26.6–33.0)
MCHC: 34.1 g/dL (ref 31.5–35.7)
MCV: 88 fL (ref 79–97)
Monocytes Absolute: 0.4 10*3/uL (ref 0.1–0.9)
Monocytes: 7 %
Neutrophils Absolute: 3.6 10*3/uL (ref 1.4–7.0)
Neutrophils: 67 %
Platelets: 283 10*3/uL (ref 150–450)
RBC: 4.3 x10E6/uL (ref 3.77–5.28)
RDW: 12.3 % (ref 11.7–15.4)
WBC: 5.5 10*3/uL (ref 3.4–10.8)

## 2019-07-08 LAB — HEPATIC FUNCTION PANEL
ALT: 12 IU/L (ref 0–32)
AST: 13 IU/L (ref 0–40)
Albumin: 4.3 g/dL (ref 3.9–5.0)
Alkaline Phosphatase: 137 IU/L — ABNORMAL HIGH (ref 39–117)
Bilirubin Total: 0.2 mg/dL (ref 0.0–1.2)
Bilirubin, Direct: 0.06 mg/dL (ref 0.00–0.40)
Total Protein: 6.5 g/dL (ref 6.0–8.5)

## 2019-07-08 LAB — VITAMIN D 25 HYDROXY (VIT D DEFICIENCY, FRACTURES): Vit D, 25-Hydroxy: 23.8 ng/mL — ABNORMAL LOW (ref 30.0–100.0)

## 2019-07-08 MED ORDER — VITAMIN D (ERGOCALCIFEROL) 1.25 MG (50000 UNIT) PO CAPS: 50000.0000 [IU] | ORAL_CAPSULE | ORAL | 1 refills | Status: DC

## 2019-07-08 NOTE — Progress Notes (Signed)
Vit d 

## 2019-07-11 ENCOUNTER — Telehealth: Payer: Self-pay | Admitting: Neurology

## 2019-07-11 NOTE — Telephone Encounter (Signed)
[employer] order sent to [facility]. They will obtain the auth and reach out to the patient to schedule.  

## 2019-08-08 NOTE — Telephone Encounter (Signed)
Taylor Donovan: [id number] (exp. 08/05/19 to 02/01/20) patient is scheduled at [facility] for 08/09/19.

## 2019-08-09 ENCOUNTER — Ambulatory Visit
Admission: RE | Admit: 2019-08-09 | Discharge: 2019-08-09 | Disposition: A | Discharge status: Home / Self Care | Payer: 59 | Source: Ambulatory Visit | Attending: Neurology | Admitting: Neurology

## 2019-08-09 ENCOUNTER — Other Ambulatory Visit: Payer: Self-pay

## 2019-08-09 DIAGNOSIS — G35 Multiple sclerosis: Secondary | ICD-10-CM

## 2019-08-31 ENCOUNTER — Telehealth: Payer: Self-pay | Admitting: *Deleted

## 2019-08-31 NOTE — Telephone Encounter (Signed)
MD provided signed start form to send in. Faxed completed/signed Vumerity start form to [employer] at [phone]. Received fax confirmation.

## 2019-09-05 NOTE — Telephone Encounter (Signed)
Received fax from [employer] that [facility] has confirmed that Vumerity rx has been shipped. Phone: [phone]. Fax: [phone].

## 2019-09-05 NOTE — Telephone Encounter (Signed)
Received fax from [place] that [place] vumerity approved 09/02/19-09/01/20. PA#[employer] standard plan [id number].

## 2019-09-07 NOTE — Telephone Encounter (Signed)
Called pt. She confirmed she received vumerity and will be started medication tomorrow. Advised her to call back if she has any future questions/concerns. She verbalized understanding.

## 2019-09-15 ENCOUNTER — Other Ambulatory Visit: Payer: Self-pay | Admitting: Neurology

## 2019-11-16 ENCOUNTER — Telehealth: Payer: Self-pay | Admitting: Neurology

## 2019-11-16 NOTE — Telephone Encounter (Signed)
Pt has called to report that she is unable to tolerate VUMERITY 231 MG CPDR She feels within the last few months she has become worse while on VUMERITY 231 MG CPDR. Pt is asking for a call to be placed on another medication.

## 2019-11-16 NOTE — Telephone Encounter (Signed)
Called pt. Advised Dr. Epimenio Foot would like her to come in for an appt to further discuss treatment options. Scheduled appt for 11/22/19 at 1pm with Dr. Epimenio Foot. He had a cx. Advised pt to check in by 12:30p and bring insurance cards/updated med list. She verbalized understanding. She is going to stop Vumerity, having stomach cramps/in the bathroom all day.

## 2019-11-22 ENCOUNTER — Ambulatory Visit (INDEPENDENT_AMBULATORY_CARE_PROVIDER_SITE_OTHER): Payer: No Typology Code available for payment source | Admitting: Neurology

## 2019-11-22 VITALS — BP 129/79 | HR 86 | Ht 67.0 in | Wt 234.5 lb

## 2019-11-22 DIAGNOSIS — G35 Multiple sclerosis: Secondary | ICD-10-CM | POA: Diagnosis not present

## 2019-11-22 DIAGNOSIS — Z79899 Other long term (current) drug therapy: Secondary | ICD-10-CM | POA: Diagnosis not present

## 2019-11-22 NOTE — Progress Notes (Signed)
[facility]  PATIENT: Taylor Donovan DOB: 1990-07-23  REFERRING DOCTOR OR PCP:  Vicenta Aly, FNP SOURCE: patient, notes from ED/hospital stay, lab results, imaging results, MRI images on PACS  _________________________________   HISTORICAL  CHIEF COMPLAINT:  Chief Complaint  Patient presents with  . Follow-up    RM 12, alone. Here to discuss other DMT options for MS.    HISTORY OF PRESENT ILLNESS:  Taylor Donovan is a 29 y.o. woman with MS reporting new left sided visual changes  Update 11/22/2019: She tried Vumerity but has had trouble tolerating it due to GI issues.    She had similar but milder issues with Tecfidera.   She had not been on any other medications in the past.     Gait is doing ok but she trips some -- usually just a stumble.   She denies weakness in her legs.   No dysesthesias in her legs. Vision is doing well.   She has bladder urgency.  No incontinence.     She sleeps poorly some nights due to getting comfortable.    Mood is fine.  Cognition is doing well.      She has not had her Covid-19 vaccination.    When younger, she had the VZV vaccination but actually had chicken pox a few weeks later.   She has had shingles.   She delivered a healthy boy 05/31/2019 by C-section.   She is not planning anther pregnancy and uses an IUD.     MS History:   She presented with left optic neuritis in October 2017.   MRI of the brain showed several other foci, worrisome for multiple sclerosis. One of the foci is in the right middle cerebellar peduncle and a couple of the foci are periventricular. I personally reviewed the MRI of the brain dated 01/07/2016. There are several T2/FLAIR hyperintense foci, one is in the right middle cerebellar peduncle and a couple are in the periventricular white matter. Other foci are in the subcortical deep white matter. None of the foci enhanced after contrast admission to the hospital.   I also reviewed the MRI of  the orbits performed that day. That MRI was normal.   I reviewed the admission and discharge notes from her emergency room visit and hospital stay this month.   TSH was elevated and B12 was reduced.   ESR, CRP, RPR, Lyme Abs/DNA, ANA were negative.     MRI 08/09/2019 showed scattered T2/FLAIR hyperintense foci in the hemispheres and right middle cerebellar peduncle in a pattern configuration consistent with chronic demyelinating plaque associated with multiple sclerosis.  None of the foci appear to be acute.  Compared to the MRI dated 01/05/2018, there are no new lesions.  REVIEW OF SYSTEMS: Constitutional: No fevers, chills, sweats, or change in appetite. She notes fatigue and some sleepiness. Eyes: as above Ear, nose and throat: No hearing loss, ear pain, nasal congestion, sore throat Cardiovascular: No chest pain, palpitations Respiratory: No shortness of breath at rest or with exertion.   No wheezes.   She snores. GastrointestinaI: No nausea, vomiting, diarrhea, abdominal pain, fecal incontinence Genitourinary: No dysuria, urinary retention or frequency.  2 x nocturia. Musculoskeletal: No neck pain, back pain Integumentary: No rash, pruritus, skin lesions Neurological: as above Psychiatric: Notes some depression better on Lexapro.  No anxiety Endocrine: No palpitations, diaphoresis, change in appetite, change in weigh or increased thirst Hematologic/Lymphatic: No anemia, purpura, petechiae. Allergic/Immunologic: No itchy/runny eyes, nasal congestion, recent allergic reactions, rashes  ALLERGIES: No Known Allergies  HOME MEDICATIONS:  Current Outpatient Medications:  .  acetaminophen (TYLENOL) 325 MG tablet, Take 650 mg by mouth every 6 (six) hours as needed for mild pain or headache., Disp: , Rfl:  .  ibuprofen (ADVIL) 200 MG tablet, Take 3 tablets (600 mg total) by mouth every 6 (six) hours as needed., Disp: 30 tablet, Rfl: 0 .  levothyroxine (SYNTHROID) 125 MCG tablet, Take 250  mcg by mouth daily before breakfast., Disp: , Rfl:  .  paragard intrauterine copper IUD IUD, 1 each by Intrauterine route once., Disp: , Rfl:  .  Vitamin D, Ergocalciferol, (DRISDOL) 1.25 MG (50000 UNIT) CAPS capsule, Take 1 capsule (50,000 Units total) by mouth every 7 (seven) days., Disp: 13 capsule, Rfl: 1  PAST MEDICAL HISTORY: Past Medical History:  Diagnosis Date  . Acid reflux   . Depression    pp  . Hypothyroid   . Multiple sclerosis ([place])   . Ovarian cyst   . PCOS (polycystic ovarian syndrome)   . S/P cesarean section 06/01/2019  . Thyroid disease   . Vision abnormalities     PAST SURGICAL HISTORY: Past Surgical History:  Procedure Laterality Date  . CESAREAN SECTION N/A 06/01/2019   Procedure: CESAREAN SECTION;  Surgeon: Janyth Contes, MD;  Location: [facility];  Service: Obstetrics;  Laterality: N/A;    FAMILY HISTORY: Family History  Problem Relation Age of Onset  . Hypertension Mother   . Diabetes Mellitus II Mother   . Healthy Father   . Healthy Maternal Aunt   . Healthy Maternal Grandmother     SOCIAL HISTORY:  Social History   Socioeconomic History  . Marital status: Married    Spouse name: Not on file  . Number of children: Not on file  . Years of education: Not on file  . Highest education level: Not on file  Occupational History  . Not on file  Tobacco Use  . Smoking status: Current Every Day Smoker    Packs/day: 0.50    Years: 8.00    Pack years: 4.00    Types: Cigarettes  . Smokeless tobacco: Never Used  Vaping Use  . Vaping Use: Never used  Substance and Sexual Activity  . Alcohol use: Yes    Comment: occasionally  . Drug use: No  . Sexual activity: Yes    Birth control/protection: Pill  Other Topics Concern  . Not on file  Social History Narrative  . Not on file   Social Determinants of Health   Financial Resource Strain:   . Difficulty of Paying Living Expenses: Not on file  Food Insecurity:   . Worried About Paediatric nurse in the Last Year: Not on file  . Ran Out of Food in the Last Year: Not on file  Transportation Needs:   . Lack of Transportation (Medical): Not on file  . Lack of Transportation (Non-Medical): Not on file  Physical Activity:   . Days of Exercise per Week: Not on file  . Minutes of Exercise per Session: Not on file  Stress:   . Feeling of Stress : Not on file  Social Connections:   . Frequency of Communication with Friends and Family: Not on file  . Frequency of Social Gatherings with Friends and Family: Not on file  . Attends Religious Services: Not on file  . Active Member of Clubs or Organizations: Not on file  . Attends Archivist Meetings: Not on file  . Marital  Status: Not on file  Intimate Partner Violence:   . Fear of Current or Ex-Partner: Not on file  . Emotionally Abused: Not on file  . Physically Abused: Not on file  . Sexually Abused: Not on file     PHYSICAL EXAM  Vitals:   11/22/19 1302  BP: 129/79  Pulse: 86  Weight: 234 lb 8 oz (106.4 kg)  Height: _0  (1.702 m)    Body mass index is 36.73 kg/m.   General: The patient is well-developed and well-nourished and in no acute distress.   Musculoskeletal: She has tenderness over the trochanteric bursae Neurologic Exam  Mental status: The patient is alert and oriented x 3 at the time of the examination. The patient has apparent normal recent and remote memory, with an apparently normal attention span and concentration ability.   Speech is normal.  Cranial nerves: Extraocular movements are full. reduced left eye visual acuity and color vision.   Facial strength and sensation is normal. Trapezius strength is normal. No dysarthria is noted.  The tongue is midline, and the patient has symmetric elevation of the soft palate. No obvious hearing deficits are noted.  Motor:  Muscle bulk is normal.   Muscle tone and strength are fine.  Sensory: She reported decreased vibration sensation in right  arm and leg compared to left but symmetric temperature sensation.   Coordination: Cerebellar testing shows good finger-nose-finger and heel-to-shin bilaterally..  Gait and station: Station is normal.  Gait is normal.  Tandem gait is mildly wide.  Romberg is negative.    Reflexes: Deep tendon reflexes are symmetric and normal bilaterally.     EDSS Ambulation 1  Has urinary urgency and fatigue    DIAGNOSTIC DATA (LABS, IMAGING, TESTING) - I reviewed patient records, labs, notes, testing and imaging myself where available.  Lab Results  Component Value Date   WBC 5.5 07/07/2019   HGB 12.9 07/07/2019   HCT 37.8 07/07/2019   MCV 88 07/07/2019   PLT 283 07/07/2019      Component Value Date/Time   NA 138 03/15/2017 1942   K 4.0 03/15/2017 1942   CL 107 03/15/2017 1942   CO2 27 03/15/2017 1942   GLUCOSE 79 03/15/2017 1942   BUN 10 03/15/2017 1942   CREATININE 0.67 03/15/2017 1942   CALCIUM 8.9 03/15/2017 1942   PROT 6.5 07/07/2019 1558   ALBUMIN 4.3 07/07/2019 1558   AST 13 07/07/2019 1558   ALT 12 07/07/2019 1558   ALKPHOS 137 (H) 07/07/2019 1558   BILITOT 0.2 07/07/2019 1558   GFRNONAA >60 03/15/2017 1942   GFRAA >60 03/15/2017 1942   No results found for: CHOL, HDL, LDLCALC, LDLDIRECT, TRIG, CHOLHDL  EKG 11/22/2019 was normal    ASSESSMENT AND PLAN  No diagnosis found.    1.     Because she is having side effects on Vumerity and also had some side effects on Tecfidera, we discussed changing to a different DMT.  She is potentially interested in the S1 P receptor modulators such as Zeposia.  We will check some lab work.  EKG was normal.   2.    Continue off other medications 3.   Stay active and exercise 4.   Return in 5-6 months or sooner if there are new or worsening neurologic symptoms.   Rochell Mabie A. Felecia Shelling, MD, PhD 9/48/5462, 7:03 PM Certified in Neurology, [place] [facility]phys[facility], Sleep Medicine, Pain Medicine and Neuroimaging  [facility] [address], [place], [place] [address] [phone]   Addendum: Despite having the varicella vaccination as a child, her VZV IgG is nonreactive.  Therefore, I would want her to get vaccinated before beginning an S1 P receptor modulator.  Another option would be to go on Aubagio. --RAS

## 2019-11-23 ENCOUNTER — Encounter: Payer: Self-pay | Admitting: Neurology

## 2019-11-23 ENCOUNTER — Telehealth: Payer: Self-pay | Admitting: *Deleted

## 2019-11-23 ENCOUNTER — Other Ambulatory Visit: Payer: Self-pay | Admitting: Neurology

## 2019-11-23 DIAGNOSIS — G35 Multiple sclerosis: Secondary | ICD-10-CM

## 2019-11-23 DIAGNOSIS — Z79899 Other long term (current) drug therapy: Secondary | ICD-10-CM

## 2019-11-23 LAB — CBC WITH DIFFERENTIAL/PLATELET
Basophils Absolute: 0.1 10*3/uL (ref 0.0–0.2)
Basos: 1 %
EOS (ABSOLUTE): 0.3 10*3/uL (ref 0.0–0.4)
Eos: 4 %
Hematocrit: 39.2 % (ref 34.0–46.6)
Hemoglobin: 13 g/dL (ref 11.1–15.9)
Immature Grans (Abs): 0 10*3/uL (ref 0.0–0.1)
Immature Granulocytes: 0 %
Lymphocytes Absolute: 1.3 10*3/uL (ref 0.7–3.1)
Lymphs: 19 %
MCH: 29.1 pg (ref 26.6–33.0)
MCHC: 33.2 g/dL (ref 31.5–35.7)
MCV: 88 fL (ref 79–97)
Monocytes Absolute: 0.4 10*3/uL (ref 0.1–0.9)
Monocytes: 6 %
Neutrophils Absolute: 4.8 10*3/uL (ref 1.4–7.0)
Neutrophils: 70 %
Platelets: 268 10*3/uL (ref 150–450)
RBC: 4.46 x10E6/uL (ref 3.77–5.28)
RDW: 12.9 % (ref 11.7–15.4)
WBC: 6.8 10*3/uL (ref 3.4–10.8)

## 2019-11-23 LAB — HEPATIC FUNCTION PANEL
ALT: 19 IU/L (ref 0–32)
AST: 14 IU/L (ref 0–40)
Albumin: 4.4 g/dL (ref 3.9–5.0)
Alkaline Phosphatase: 102 IU/L (ref 48–121)
Bilirubin Total: <0.2 mg/dL (ref 0.0–1.2)
Bilirubin, Direct: 0.05 mg/dL (ref 0.00–0.40)
Total Protein: 6.9 g/dL (ref 6.0–8.5)

## 2019-11-23 LAB — VARICELLA ZOSTER ANTIBODY, IGG: Varicella zoster IgG: <135 index — ABNORMAL LOW (ref >165)

## 2019-11-23 NOTE — Telephone Encounter (Signed)
Called pt. Relayed results per Dr. Bonnita Hollow note. Her grandmother's father had TB. Mother has tested + in the past but never had TB. Personally, she has had - TB tests in the past per mother. I placed her on hold and spoke with Dr. Epimenio Foot. He states it should be ok. Needs a recent TB test in the last year anyways. She is agreeable to go on Aubagio. She will come by on Monday 11/28/19 to sign start form and get TB test completed. She will then need hepatic function panel monthly x6 months after starting on medication per Dr. Epimenio Foot.

## 2019-11-23 NOTE — Telephone Encounter (Signed)
-----   Message from Asa Lente, MD sent at 11/23/2019  4:48 PM EDT ----- The Varicella antibody came back negative so she has not built up immunity against the chicken pox virus: We can have hr go on Aubagio as a different medication - she would need to have a quantiferon TB test ( I entered into computer) and she can sign form when she comes.  Alternatively, she can stop Vumerity and get the shingles vaccine.   At drug store or pcp

## 2019-11-28 ENCOUNTER — Other Ambulatory Visit (INDEPENDENT_AMBULATORY_CARE_PROVIDER_SITE_OTHER): Payer: Self-pay

## 2019-11-28 DIAGNOSIS — Z0289 Encounter for other administrative examinations: Secondary | ICD-10-CM

## 2019-11-28 DIAGNOSIS — G35 Multiple sclerosis: Secondary | ICD-10-CM

## 2019-11-28 DIAGNOSIS — Z79899 Other long term (current) drug therapy: Secondary | ICD-10-CM

## 2019-11-28 NOTE — Telephone Encounter (Signed)
Pt came by for labs and signed aubagio start form. Will hold start form until labs come back.

## 2019-11-28 NOTE — Telephone Encounter (Signed)
Initiated PA for [employer] via CMM.  KeyBarbaraann Faster - PA Case ID: [id number]. Has not been sent to plan. Will confirm labs before sending to plan.

## 2019-11-30 LAB — QUANTIFERON-TB GOLD PLUS
QuantiFERON Mitogen Value: 6.45 IU/mL
QuantiFERON Nil Value: 0.02 IU/mL
QuantiFERON TB1 Ag Value: 0.06 IU/mL
QuantiFERON TB2 Ag Value: 0.04 IU/mL
QuantiFERON-TB Gold Plus: NEGATIVE

## 2019-11-30 NOTE — Telephone Encounter (Addendum)
Per Dr. Epimenio Foot: "The TB test was negative so we can do the Aubagio." I called pt to let her know. She verbalized understanding. I asked her to call our office once she receives Aubagio so we can set up monthly LFT's x6 months after she gets started.   Faxed completed/signed Aubagio start form to [facility] at [phone]. Received fax confirmation.

## 2019-12-01 NOTE — Telephone Encounter (Signed)
Faxed completed/signed PA Aubagio to [facility] at [phone]. Received fax confirmation. Waiting on determination.

## 2019-12-01 NOTE — Telephone Encounter (Signed)
Unable to submit aubagio PA via CMM. Requested fax of PA.

## 2019-12-02 ENCOUNTER — Telehealth: Payer: Self-pay | Admitting: Neurology

## 2019-12-02 NOTE — Telephone Encounter (Signed)
FYI-Pt called wanting to inform RN that she has received her Teriflunomide (AUBAGIO) 14 MG TABS today.

## 2019-12-06 ENCOUNTER — Other Ambulatory Visit: Payer: Self-pay

## 2019-12-06 DIAGNOSIS — G35 Multiple sclerosis: Secondary | ICD-10-CM

## 2019-12-06 DIAGNOSIS — Z79899 Other long term (current) drug therapy: Secondary | ICD-10-CM

## 2019-12-06 NOTE — Telephone Encounter (Signed)
I called [employer]. Pt's aubagio PA was approved from 12/06/2019-12/05/2020. PA# [id number]. I have informed Angelique at [facility].

## 2019-12-06 NOTE — Telephone Encounter (Signed)
Taylor Donovan, can you call patient and set up her monthly LFT's x6 months starting in October? Thank you!

## 2019-12-06 NOTE — Telephone Encounter (Signed)
I called pt. She has received the calls from [facility] Nurse line and will call them back today.

## 2019-12-06 NOTE — Telephone Encounter (Signed)
Received this notice from Angelique at [facility] [employer] [facility] [employer]: "Thank you for the update. Also we have made attempts to reach this patient with no response. Please reach out to the pt to request she call back to the [facility] Nurse line [phone] to speak with a [facility] Nurse."

## 2019-12-06 NOTE — Telephone Encounter (Signed)
I called pt. She started [place] on 12/02/2019. She is tolerating it well with no side effects. We scheduled LFTs for 6 months and her OV with Dr. Epimenio Foot on 05/15/2019. Pt verbalized understanding of appt dates and times. Pt will call us with questions or concerns.

## 2019-12-21 ENCOUNTER — Ambulatory Visit
Admission: EM | Admit: 2019-12-21 | Discharge: 2019-12-21 | Disposition: A | Discharge status: Home / Self Care | Payer: No Typology Code available for payment source | Attending: Emergency Medicine | Admitting: Emergency Medicine

## 2019-12-21 DIAGNOSIS — S30861A Insect bite (nonvenomous) of abdominal wall, initial encounter: Secondary | ICD-10-CM

## 2019-12-21 DIAGNOSIS — W57XXXA Bitten or stung by nonvenomous insect and other nonvenomous arthropods, initial encounter: Secondary | ICD-10-CM

## 2019-12-21 MED ORDER — DOXYCYCLINE HYCLATE 100 MG PO CAPS: 200.0000 mg | ORAL_CAPSULE | Freq: Once | ORAL | 0 refills | Status: AC

## 2019-12-21 MED ORDER — TRIAMCINOLONE ACETONIDE 0.1 % EX CREA: 1.0000 "application " | TOPICAL_CREAM | Freq: Two times a day (BID) | CUTANEOUS | 0 refills | Status: AC

## 2019-12-21 NOTE — ED Provider Notes (Signed)
[facility]    CSN: [id number] Arrival date & time: 12/21/19  1637      History   Chief Complaint Chief Complaint  Patient presents with  . tick bite    HPI Trinidi Toppins is a 29 y.o. female history of MS, PCOS, presenting today for evaluation of tick bite.  Developed tick bite to upper abdomen on Sunday, was removed approximately 24 hours later Monday evening.  Has had some redness around the bite, but denies any systemic symptoms of fever, nausea vomiting, arthralgias.   HPI  Past Medical History:  Diagnosis Date  . Acid reflux   . Depression    pp  . Hypothyroid   . Multiple sclerosis ([facility])   . Ovarian cyst   . PCOS (polycystic ovarian syndrome)   . S/P cesarean section 06/01/2019  . Thyroid disease   . Vision abnormalities     Patient Active Problem List   Diagnosis Date Noted  . High risk medication use 07/07/2019  . Vitamin D deficiency 07/07/2019  . S/P cesarean section 06/01/2019  . Normal pregnancy in multigravida in third trimester 05/31/2019  . Pregnancy 12/21/2018  . Right shoulder pain 12/16/2017  . Multiple joint pain 08/19/2017  . Other fatigue 06/10/2017  . Depression 06/10/2017  . Bilateral hip bursitis 12/10/2016  . BMI 35.0-35.9,adult 12/10/2016  . Insomnia 06/10/2016  . Urinary urgency 06/10/2016  . Multiple sclerosis ([facility]) 01/29/2016  . 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 Vision loss of left eye 01/08/2016  . Abnormal brain MRI 01/08/2016  . Hypothyroidism 01/08/2016  . Vision loss, left eye 01/08/2016  . HASHIMOTO'S THYROIDITIS 08/13/2009  . DYSPNEA ON EXERTION 08/13/2009    Past Surgical History:  Procedure Laterality Date  . CESAREAN SECTION N/A 06/01/2019   Procedure: CESAREAN SECTION;  Surgeon: Sherian Rein, MD;  Location: [facility];  Service: Obstetrics;  Laterality: N/A;    OB History    Gravida  2   Para  2   Term  1   Preterm      AB      Living  1     SAB      TAB       Ectopic      Multiple  0   Live Births  1            Home Medications    Prior to Admission medications   Medication Sig Start Date End Date Taking? Authorizing Provider  acetaminophen (TYLENOL) 325 MG tablet Take 650 mg by mouth every 6 (six) hours as needed for mild pain or headache.    [provider]  doxycycline (VIBRAMYCIN) 100 MG capsule Take 2 capsules (200 mg total) by mouth once for 1 dose. 12/21/19 12/21/19  [place] C, PA-C  ibuprofen (ADVIL) 200 MG tablet Take 3 tablets (600 mg total) by mouth every 6 (six) hours as needed. 06/04/19   Huel Cote, MD  levothyroxine (SYNTHROID) 125 MCG tablet Take 250 mcg by mouth daily before breakfast.    [provider]  paragard intrauterine copper IUD IUD 1 each by Intrauterine route once.    [provider]  Teriflunomide (AUBAGIO) 14 MG TABS Take by mouth.    [provider]  triamcinolone cream (KENALOG) 0.1 % Apply 1 application topically 2 (two) times daily. 12/21/19   Mollee Neer C, PA-C  Vitamin D, Ergocalciferol, (DRISDOL) 1.25 MG (50000 UNIT) CAPS capsule Take 1 capsule (50,000 Units total) by mouth  every 7 (seven) days. 07/08/19   Sater, Pearletha Furl, MD    Family History Family History  Problem Relation Age of Onset  . Hypertension Mother   . Diabetes Mellitus II Mother   . Healthy Father   . Healthy Maternal Aunt   . Healthy Maternal Grandmother     Social History Social History   Tobacco Use  . Smoking status: Current Every Day Smoker    Packs/day: 0.50    Years: 8.00    Pack years: 4.00    Types: Cigarettes  . Smokeless tobacco: Never Used  Vaping Use  . Vaping Use: Never used  Substance Use Topics  . Alcohol use: Yes    Comment: occasionally  . Drug use: No     Allergies   Patient has no known allergies.   Review of Systems Review of Systems  Constitutional: Negative for fatigue and fever.  Eyes: Negative for visual disturbance.    Respiratory: Negative for shortness of breath.   Cardiovascular: Negative for chest pain.  Gastrointestinal: Negative for abdominal pain, nausea and vomiting.  Musculoskeletal: Negative for arthralgias and joint swelling.  Skin: Positive for color change. Negative for rash and wound.  Neurological: Negative for dizziness, weakness, light-headedness and headaches.     Physical Exam Triage Vital Signs ED Triage Vitals  Enc Vitals Group     BP 12/21/19 1658 125/89     Pulse Rate 12/21/19 1658 94     Resp 12/21/19 1658 18     Temp 12/21/19 1658 98.5 F (36.9 C)     Temp Source 12/21/19 1658 Oral     SpO2 12/21/19 1658 95 %     Weight --      Height --      Head Circumference --      Peak Flow --      Pain Score 12/21/19 1659 0     Pain Loc --      Pain Edu? --      Excl. in GC? --    No data found.  Updated Vital Signs BP 125/89 (BP Location: Left Arm)   Pulse 94   Temp 98.5 F (36.9 C) (Oral)   Resp 18   SpO2 95%   Breastfeeding No   Visual Acuity Right Eye Distance:   Left Eye Distance:   Bilateral Distance:    Right Eye Near:   Left Eye Near:    Bilateral Near:     Physical Exam Vitals and nursing note reviewed.  Constitutional:      Appearance: She is well-developed.     Comments: No acute distress  HENT:     Head: Normocephalic and atraumatic.     Nose: Nose normal.  Eyes:     Conjunctiva/sclera: Conjunctivae normal.  Cardiovascular:     Rate and Rhythm: Normal rate.  Pulmonary:     Effort: Pulmonary effort is normal. No respiratory distress.  Abdominal:     General: There is no distension.  Musculoskeletal:        General: Normal range of motion.     Cervical back: Neck supple.  Skin:    General: Skin is warm and dry.     Comments: Erythematous 0.25 cm area noted to upper abdomen  No surrounding erythema warmth induration  Neurological:     Mental Status: She is alert and oriented to person, place, and time.      UC Treatments /  Results  Labs (all labs ordered are listed, but only abnormal  results are displayed) Labs Reviewed - No data to display  EKG   Radiology No results found.  Procedures Procedures (including critical care time)  Medications Ordered in UC Medications - No data to display  Initial Impression / Assessment and Plan / UC Course  I have reviewed the triage vital signs and the nursing notes.  Pertinent labs & imaging results that were available during my care of the patient were reviewed by me and considered in my medical decision making (see chart for details).     Tick bite to upper abdomen, no signs of cellulitis or infection, minimal localized inflammation/swelling.  No systemic symptoms.  Providing triamcinolone to use topically as well as doxycycline 200 mg once for prophylaxis.  Within 72 hours of tick bite removal.  Discussed strict return precautions. Patient verbalized understanding and is agreeable with plan.  Final Clinical Impressions(s) / UC Diagnoses   Final diagnoses:  Tick bite of abdomen, initial encounter     Discharge Instructions     May use. triamcinolone twice daily to help with local inflammation/itching Doxycycline 200 mg once for prevention of tickborne illnesses Please monitor for gradual healing of this area, follow-up for any concerns   ED Prescriptions    Medication Sig Dispense Auth. Provider   triamcinolone cream (KENALOG) 0.1 % Apply 1 application topically 2 (two) times daily. 30 g Burnell Hurta C, PA-C   doxycycline (VIBRAMYCIN) 100 MG capsule Take 2 capsules (200 mg total) by mouth once for 1 dose. 2 capsule Mical Kicklighter, [place] C, PA-C     PDMP not reviewed this encounter.   Lew Dawes, [place] 12/21/19 1744

## 2019-12-21 NOTE — ED Triage Notes (Signed)
Pt c/o tick bite to upper abdomen on Sunday, now has a red ring around.

## 2019-12-21 NOTE — Discharge Instructions (Signed)
May use. triamcinolone twice daily to help with local inflammation/itching Doxycycline 200 mg once for prevention of tickborne illnesses Please monitor for gradual healing of this area, follow-up for any concerns

## 2020-01-02 ENCOUNTER — Other Ambulatory Visit (INDEPENDENT_AMBULATORY_CARE_PROVIDER_SITE_OTHER): Payer: Self-pay

## 2020-01-02 DIAGNOSIS — Z79899 Other long term (current) drug therapy: Secondary | ICD-10-CM

## 2020-01-02 DIAGNOSIS — Z0289 Encounter for other administrative examinations: Secondary | ICD-10-CM

## 2020-01-02 DIAGNOSIS — G35 Multiple sclerosis: Secondary | ICD-10-CM

## 2020-01-03 ENCOUNTER — Telehealth: Payer: Self-pay

## 2020-01-03 LAB — HEPATIC FUNCTION PANEL
ALT: 33 IU/L — ABNORMAL HIGH (ref 0–32)
AST: 20 IU/L (ref 0–40)
Albumin: 4.7 g/dL (ref 3.9–5.0)
Alkaline Phosphatase: 117 IU/L (ref 44–121)
Bilirubin Total: 0.3 mg/dL (ref 0.0–1.2)
Bilirubin, Direct: <0.1 mg/dL (ref 0.00–0.40)
Total Protein: 6.8 g/dL (ref 6.0–8.5)

## 2020-01-03 NOTE — Telephone Encounter (Signed)
I spoke with Dr. Epimenio Foot. Pt's LFTs are fine from 01/02/2020. Will send pt a mychart message

## 2020-01-10 ENCOUNTER — Other Ambulatory Visit: Payer: Self-pay | Admitting: Neurology

## 2020-02-06 ENCOUNTER — Other Ambulatory Visit: Payer: Self-pay

## 2020-02-27 ENCOUNTER — Telehealth: Payer: Self-pay

## 2020-02-27 NOTE — Telephone Encounter (Signed)
I called pt. She has not come by for LFTs this month because she forgot. She has a cold so will not come this week but if her symptoms resolve she may come next week.

## 2020-03-05 ENCOUNTER — Other Ambulatory Visit (INDEPENDENT_AMBULATORY_CARE_PROVIDER_SITE_OTHER): Payer: Self-pay

## 2020-03-05 DIAGNOSIS — Z79899 Other long term (current) drug therapy: Secondary | ICD-10-CM

## 2020-03-05 DIAGNOSIS — G35 Multiple sclerosis: Secondary | ICD-10-CM

## 2020-03-05 DIAGNOSIS — Z0289 Encounter for other administrative examinations: Secondary | ICD-10-CM

## 2020-03-06 LAB — HEPATIC FUNCTION PANEL
ALT: 27 IU/L (ref 0–32)
AST: 26 IU/L (ref 0–40)
Albumin: 4.4 g/dL (ref 3.9–5.0)
Alkaline Phosphatase: 119 IU/L (ref 44–121)
Bilirubin Total: 0.3 mg/dL (ref 0.0–1.2)
Bilirubin, Direct: <0.1 mg/dL (ref 0.00–0.40)
Total Protein: 6.6 g/dL (ref 6.0–8.5)

## 2020-04-09 ENCOUNTER — Other Ambulatory Visit: Payer: Self-pay

## 2020-04-12 ENCOUNTER — Telehealth: Payer: Self-pay | Admitting: Neurology

## 2020-04-12 NOTE — Telephone Encounter (Signed)
Dr. Epimenio Foot- do you want me to get her worked in for an appt to see you for evaluation?

## 2020-04-12 NOTE — Telephone Encounter (Signed)
Called pt and relayed Dr. Bonnita Hollow message. Pt denies any respiratory sx but did state she started with congestion today. She tested negative for covid-19 last Thursday. Has not been anywhere since new years.  Her 44 month old boy does have RSV. Scheduled work in appt for 04/18/20 at 1:30pm with Dr. Epimenio Foot. Advised she should let us know if her sx worsen and we can always change appt to mychart VV. Advised her to bring updated insurance cards for the new year. She verbalized understanding.

## 2020-04-12 NOTE — Telephone Encounter (Signed)
We can try to work her in next week    Also, if any upper resp symptoms should test for Covid

## 2020-04-12 NOTE — Telephone Encounter (Signed)
Pt. Called to inform RN that she is having light headiness, feeling down, a lot of back pain, leg pain & headaches. She states she has been experiencing these the last week & wants to find out what could be the possible cause of symptoms. Please advise.

## 2020-04-18 ENCOUNTER — Ambulatory Visit: Payer: No Typology Code available for payment source | Admitting: Neurology

## 2020-04-18 ENCOUNTER — Encounter: Payer: Self-pay | Admitting: Neurology

## 2020-05-14 ENCOUNTER — Ambulatory Visit: Payer: Self-pay | Admitting: Neurology

## 2020-05-21 ENCOUNTER — Telehealth: Payer: Self-pay

## 2020-05-21 NOTE — Telephone Encounter (Signed)
Patient returned my call.  She will come this week for her LFTs.  I gave her office hours and recommended she avoid coming during lunch break.

## 2020-05-21 NOTE — Telephone Encounter (Signed)
Patient is overdue for her monthly LFTs.  She has not had LFTs drawn since December 2021.  I called patient to remind her to come to the office for her lab work.  No answer, left voicemail asking her to call us back.

## 2020-05-28 NOTE — Telephone Encounter (Signed)
It does not appear that patient came last week for lab work.  I called patient again to remind her to come for lab work.  No answer, left a voicemail asking her to call us back.  If patient calls back please asked her to come for lab work and give her office hours.

## 2020-06-05 NOTE — Telephone Encounter (Signed)
It does not appear that patient came last week or this week for lab work.  Patient is way overdue for her lab work while on Aubagio.   I called patient. No answer, left voicemail asking her to call us back.  If patient calls back please ask her to come for lab work and give her our office hours.

## 2020-06-11 ENCOUNTER — Other Ambulatory Visit: Payer: Self-pay

## 2020-06-19 NOTE — Telephone Encounter (Signed)
Patient has not yet read her MyChart message regarding lab work.  I will send her a letter in the mail with this information.

## 2020-09-18 ENCOUNTER — Ambulatory Visit: Payer: Self-pay | Admitting: Neurology

## 2020-09-18 ENCOUNTER — Encounter: Payer: Self-pay | Admitting: Neurology

## 2020-10-06 ENCOUNTER — Emergency Department (HOSPITAL_BASED_OUTPATIENT_CLINIC_OR_DEPARTMENT_OTHER): Payer: No Typology Code available for payment source

## 2020-10-06 ENCOUNTER — Other Ambulatory Visit: Payer: Self-pay

## 2020-10-06 ENCOUNTER — Emergency Department (HOSPITAL_BASED_OUTPATIENT_CLINIC_OR_DEPARTMENT_OTHER)
Admission: EM | Admit: 2020-10-06 | Discharge: 2020-10-06 | Disposition: A | Discharge status: Home / Self Care | Payer: No Typology Code available for payment source | Attending: Emergency Medicine | Admitting: Emergency Medicine

## 2020-10-06 ENCOUNTER — Encounter (HOSPITAL_BASED_OUTPATIENT_CLINIC_OR_DEPARTMENT_OTHER): Payer: Self-pay | Admitting: Emergency Medicine

## 2020-10-06 DIAGNOSIS — Z79899 Other long term (current) drug therapy: Secondary | ICD-10-CM | POA: Insufficient documentation

## 2020-10-06 DIAGNOSIS — E039 Hypothyroidism, unspecified: Secondary | ICD-10-CM | POA: Diagnosis not present

## 2020-10-06 DIAGNOSIS — R102 Pelvic and perineal pain unspecified side: Secondary | ICD-10-CM

## 2020-10-06 DIAGNOSIS — B9689 Other specified bacterial agents as the cause of diseases classified elsewhere: Secondary | ICD-10-CM | POA: Diagnosis not present

## 2020-10-06 DIAGNOSIS — F17210 Nicotine dependence, cigarettes, uncomplicated: Secondary | ICD-10-CM | POA: Insufficient documentation

## 2020-10-06 DIAGNOSIS — R1031 Right lower quadrant pain: Secondary | ICD-10-CM | POA: Diagnosis not present

## 2020-10-06 DIAGNOSIS — M545 Low back pain, unspecified: Secondary | ICD-10-CM | POA: Diagnosis not present

## 2020-10-06 DIAGNOSIS — N760 Acute vaginitis: Secondary | ICD-10-CM

## 2020-10-06 DIAGNOSIS — N898 Other specified noninflammatory disorders of vagina: Secondary | ICD-10-CM | POA: Diagnosis present

## 2020-10-06 LAB — WET PREP, GENITAL
Sperm: NONE SEEN
Trich, Wet Prep: NONE SEEN
Yeast Wet Prep HPF POC: NONE SEEN

## 2020-10-06 LAB — CBC WITH DIFFERENTIAL/PLATELET
Abs Immature Granulocytes: 0.02 10*3/uL (ref 0.00–0.07)
Basophils Absolute: 0 10*3/uL (ref 0.0–0.1)
Basophils Relative: 1 %
Eosinophils Absolute: 0.1 10*3/uL (ref 0.0–0.5)
Eosinophils Relative: 2 %
HCT: 40.2 % (ref 36.0–46.0)
Hemoglobin: 13.7 g/dL (ref 12.0–15.0)
Immature Granulocytes: 0 %
Lymphocytes Relative: 16 %
Lymphs Abs: 1.3 10*3/uL (ref 0.7–4.0)
MCH: 29.5 pg (ref 26.0–34.0)
MCHC: 34.1 g/dL (ref 30.0–36.0)
MCV: 86.5 fL (ref 80.0–100.0)
Monocytes Absolute: 0.5 10*3/uL (ref 0.1–1.0)
Monocytes Relative: 6 %
Neutro Abs: 6.5 10*3/uL (ref 1.7–7.7)
Neutrophils Relative %: 75 %
Platelets: 267 10*3/uL (ref 150–400)
RBC: 4.65 MIL/uL (ref 3.87–5.11)
RDW: 12.2 % (ref 11.5–15.5)
WBC: 8.5 10*3/uL (ref 4.0–10.5)
nRBC: 0 % (ref 0.0–0.2)

## 2020-10-06 LAB — URINALYSIS, ROUTINE W REFLEX MICROSCOPIC
Bilirubin Urine: NEGATIVE
Glucose, UA: NEGATIVE mg/dL
Ketones, ur: NEGATIVE mg/dL
Nitrite: NEGATIVE
Protein, ur: NEGATIVE mg/dL
Specific Gravity, Urine: 1.015 (ref 1.005–1.030)
pH: 6.5 (ref 5.0–8.0)

## 2020-10-06 LAB — COMPREHENSIVE METABOLIC PANEL
ALT: 19 U/L (ref 0–44)
AST: 18 U/L (ref 15–41)
Albumin: 4.2 g/dL (ref 3.5–5.0)
Alkaline Phosphatase: 81 U/L (ref 38–126)
Anion gap: 4 — ABNORMAL LOW (ref 5–15)
BUN: 7 mg/dL (ref 6–20)
CO2: 26 mmol/L (ref 22–32)
Calcium: 8.7 mg/dL — ABNORMAL LOW (ref 8.9–10.3)
Chloride: 107 mmol/L (ref 98–111)
Creatinine, Ser: 0.7 mg/dL (ref 0.44–1.00)
GFR, Estimated: >60 mL/min (ref >60)
Glucose, Bld: 90 mg/dL (ref 70–99)
Potassium: 3.9 mmol/L (ref 3.5–5.1)
Sodium: 137 mmol/L (ref 135–145)
Total Bilirubin: 0.3 mg/dL (ref 0.3–1.2)
Total Protein: 7 g/dL (ref 6.5–8.1)

## 2020-10-06 LAB — URINALYSIS, MICROSCOPIC (REFLEX)

## 2020-10-06 LAB — PREGNANCY, URINE: Preg Test, Ur: NEGATIVE

## 2020-10-06 LAB — LIPASE, BLOOD: Lipase: 22 U/L (ref 11–51)

## 2020-10-06 MED ORDER — METRONIDAZOLE 500 MG PO TABS: 500.0000 mg | ORAL_TABLET | Freq: Two times a day (BID) | ORAL | 0 refills | Status: DC

## 2020-10-06 MED ORDER — MORPHINE SULFATE (PF) 4 MG/ML IV SOLN
4.0000 mg | Freq: Once | INTRAVENOUS | Status: AC
  Administered 2020-10-06: 4 mg via INTRAVENOUS
  Filled 2020-10-06: qty 1

## 2020-10-06 MED ORDER — IOHEXOL 300 MG/ML  SOLN
100.0000 mL | Freq: Once | INTRAMUSCULAR | Status: AC | PRN
  Administered 2020-10-06: 100 mL via INTRAVENOUS

## 2020-10-06 NOTE — ED Notes (Signed)
PELVIC CART AT BEDSIDE

## 2020-10-06 NOTE — ED Notes (Signed)
Safety measures in place post IV opioid med given, pt instructed not to get up off stretcher without staff in room

## 2020-10-06 NOTE — ED Triage Notes (Signed)
Pt arrives pov with c/o RLQ pain radiating to R lower back, and stabbing vaginal pain and light bleeding x 1 week. Pt endorses having IUD. Pt denies fever, reports N/V, denies dysuria

## 2020-10-06 NOTE — ED Notes (Signed)
ED Provider at bedside. 

## 2020-10-06 NOTE — ED Provider Notes (Signed)
[facility] EMERGENCY DEPARTMENT Provider Note   CSN: [id number] Arrival date & time: 10/06/20  1009     History Chief Complaint  Patient presents with   Abdominal Pain    Taylor Donovan is a 30 y.o. female with a past medical history significant for hypothyroidism, depression, multiple sclerosis, and PCOS who presents to the ED due to worsening right lower quadrant pain x6 days.  Patient states pain radiates to right low back.  She admits to increased vaginal discharge and vaginal spotting.  Last menstrual cycle 6/16.  She currently has ParaGard IUD for birth control.  She is sexually active with her husband with no concern for STDs.  She admits to intermittent nausea and a few episodes of nonbloody, nonbilious emesis.  No fever or chills.  No diarrhea.  Patient has had 1 previous C-section, but no other abdominal operations.  Patient took a pregnancy test which was negative.  Patient notes she has had a ruptured ovarian cyst previously.  No treatment prior to arrival.  No aggravating or alleviating symptoms.  History obtained from patient and past medical records. No interpreter used during encounter.       Past Medical History:  Diagnosis Date   Acid reflux    Depression    pp   Hypothyroid    Multiple sclerosis (HCC)    Ovarian cyst    PCOS (polycystic ovarian syndrome)    S/P cesarean section 06/01/2019   Thyroid disease    Vision abnormalities     Patient Active Problem List   Diagnosis Date Noted   High risk medication use 07/07/2019   Vitamin D deficiency 07/07/2019   S/P cesarean section 06/01/2019   Normal pregnancy in multigravida in third trimester 05/31/2019   Pregnancy 12/21/2018   Right shoulder pain 12/16/2017   Multiple joint pain 08/19/2017   Other fatigue 06/10/2017   Depression 06/10/2017   Bilateral hip bursitis 12/10/2016   BMI 35.0-35.9,adult 12/10/2016   Insomnia 06/10/2016   Urinary urgency 06/10/2016   Multiple sclerosis (HCC)  01/29/2016   Ataxic gait 01/29/2016   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   Vision loss of left eye 01/08/2016   Abnormal brain MRI 01/08/2016   Hypothyroidism 01/08/2016   Vision loss, left eye 01/08/2016   HASHIMOTO'S THYROIDITIS 08/13/2009   DYSPNEA ON EXERTION 08/13/2009    Past Surgical History:  Procedure Laterality Date   CESAREAN SECTION N/A 06/01/2019   Procedure: CESAREAN SECTION;  Surgeon: Sherian Rein, MD;  Location: [facility] LD ORS;  Service: Obstetrics;  Laterality: N/A;     OB History     Gravida  2   Para  2   Term  1   Preterm      AB      Living  1      SAB      IAB      Ectopic      Multiple  0   Live Births  1           Family History  Problem Relation Age of Onset   Hypertension Mother    Diabetes Mellitus II Mother    Healthy Father    Healthy Maternal Aunt    Healthy Maternal Grandmother     Social History   Tobacco Use   Smoking status: Every Day    Packs/day: 0.50    Years: 8.00    Pack years: 4.00    Types: Cigarettes   Smokeless tobacco: Never  Vaping Use   Vaping  Use: Never used  Substance Use Topics   Alcohol use: Yes    Comment: occasionally   Drug use: No    Home Medications Prior to Admission medications   Medication Sig Start Date End Date Taking? Authorizing Provider  metroNIDAZOLE (FLAGYL) 500 MG tablet Take 1 tablet (500 mg total) by mouth 2 (two) times daily. 10/06/20  Yes Bryne Lindon, Merla Riches, PA-C  acetaminophen (TYLENOL) 325 MG tablet Take 650 mg by mouth every 6 (six) hours as needed for mild pain or headache.    [provider]  ibuprofen (ADVIL) 200 MG tablet Take 3 tablets (600 mg total) by mouth every 6 (six) hours as needed. 06/04/19   Huel Cote, MD  levothyroxine (SYNTHROID) 125 MCG tablet Take 250 mcg by mouth daily before breakfast.    [provider]  paragard intrauterine copper IUD IUD 1 each by Intrauterine route once.    [provider]  Teriflunomide (AUBAGIO)  14 MG TABS Take by mouth.    [provider]  triamcinolone cream (KENALOG) 0.1 % Apply 1 application topically 2 (two) times daily. 12/21/19   Wieters, Hallie C, PA-C  Vitamin D, Ergocalciferol, (DRISDOL) 1.25 MG (50000 UNIT) CAPS capsule TAKE 1 CAPSULE (50,000 UNITS TOTAL) BY MOUTH EVERY 7 (SEVEN) DAYS. 01/10/20   Sater, Pearletha Furl, MD    Allergies    Patient has no known allergies.  Review of Systems   Review of Systems  Constitutional:  Negative for chills and fever.  Cardiovascular:  Negative for chest pain.  Gastrointestinal:  Positive for abdominal pain, nausea and vomiting. Negative for diarrhea.  Genitourinary:  Positive for vaginal bleeding and vaginal discharge. Negative for dysuria.  Musculoskeletal:  Positive for back pain.  All other systems reviewed and are negative.  Physical Exam Updated Vital Signs BP (!) 127/97 (BP Location: Right Arm)   Pulse 71   Temp 98.1 F (36.7 C)   Resp 18   Ht 5\' 7"  (1.702 m)   Wt 99.8 kg   LMP 09/13/2020   SpO2 99%   BMI 34.46 kg/m   Physical Exam Vitals and nursing note reviewed.  Constitutional:      General: She is not in acute distress.    Appearance: She is not ill-appearing.  HENT:     Head: Normocephalic.  Eyes:     Pupils: Pupils are equal, round, and reactive to light.  Cardiovascular:     Rate and Rhythm: Normal rate and regular rhythm.     Pulses: Normal pulses.     Heart sounds: Normal heart sounds. No murmur heard.   No friction rub. No gallop.  Pulmonary:     Effort: Pulmonary effort is normal.     Breath sounds: Normal breath sounds.  Abdominal:     General: Abdomen is flat. There is no distension.     Palpations: Abdomen is soft.     Tenderness: There is abdominal tenderness. There is no right CVA tenderness, left CVA tenderness, guarding or rebound.     Comments: RLQ tenderness at McBurney's point. No rebound or guarding. No CVA tenderness bilaterally.  Genitourinary:    Comments: Pelvic exam  performed with chaperone in room.  Mild amount of blood in vaginal vault.  No CMT.  IUD strings visualized coming from cervix. No adnexal tenderness or masses. Musculoskeletal:        General: Normal range of motion.     Cervical back: Neck supple.  Skin:    General: Skin is warm and dry.  Neurological:     General: No focal deficit present.     Mental Status: She is alert.  Psychiatric:        Mood and Affect: Mood normal.        Behavior: Behavior normal.    ED Results / Procedures / Treatments   Labs (all labs ordered are listed, but only abnormal results are displayed) Labs Reviewed  WET PREP, GENITAL - Abnormal; Notable for the following components:      Result Value   Clue Cells Wet Prep HPF POC PRESENT (*)    WBC, Wet Prep HPF POC MODERATE (*)    All other components within normal limits  COMPREHENSIVE METABOLIC PANEL - Abnormal; Notable for the following components:   Calcium 8.7 (*)    Anion gap 4 (*)    All other components within normal limits  URINALYSIS, ROUTINE W REFLEX MICROSCOPIC - Abnormal; Notable for the following components:   APPearance CLOUDY (*)    Hgb urine dipstick LARGE (*)    Leukocytes,Ua SMALL (*)    All other components within normal limits  URINALYSIS, MICROSCOPIC (REFLEX) - Abnormal; Notable for the following components:   Bacteria, UA MANY (*)    All other components within normal limits  URINE CULTURE  CBC WITH DIFFERENTIAL/PLATELET  LIPASE, BLOOD  PREGNANCY, URINE  GC/CHLAMYDIA PROBE AMP ([place]) NOT AT [facility]    EKG None  Radiology CT ABDOMEN PELVIS W CONTRAST  Result Date: 10/06/2020 CLINICAL DATA:  Right lower quadrant abdominal pain. EXAM: CT ABDOMEN AND PELVIS WITH CONTRAST TECHNIQUE: Multidetector CT imaging of the abdomen and pelvis was performed using the standard protocol following bolus administration of intravenous contrast. CONTRAST:  OMNIPAQUE IOHEXOL 300 MG/ML  SOLN COMPARISON:  CT abdomen pelvis-11/29/2016  FINDINGS: Lower chest: Limited visualization of the lower thorax is negative for focal airspace opacity or pleural effusion Normal heart size.  No pericardial effusion. Hepatobiliary: Normal hepatic contour. There is diffuse decreased attenuation hepatic parenchyma on this postcontrast examination suggestive of hepatic steatosis. There is a minimal amount of focal fatty infiltration adjacent to the fissure for the ligamentum teres. No discrete hepatic lesions. Normal appearance of the gallbladder given degree of distention. No radiopaque gallstones. No intra or extrahepatic biliary ductal dilatation. No ascites. Pancreas: Normal appearance of the pancreas. Spleen: Normal appearance of the spleen. Note is made of a small splenule about the anterior inferior aspect of the spleen. Adrenals/Urinary Tract: There is symmetric enhancement of the bilateral kidneys. No evidence of nephrolithiasis on this postcontrast examination. No discrete renal lesions. No urinary obstruction or perinephric stranding Normal appearance the bilateral adrenal glands. Normal appearance of the urinary bladder given degree of distention. Stomach/Bowel: Scattered colonic diverticulosis without evidence of superimposed acute diverticulitis. Normal appearance of the terminal ileum and the retrocecal appendix. No discrete areas of bowel wall thickening. No significant hiatal hernia. No pneumoperitoneum, pneumatosis or portal venous gas. Vascular/Lymphatic: There is a minimal amount of eccentric noncalcified atherosclerotic plaque within normal caliber abdominal aorta. The major branch vessels of the abdominal aorta appear patent on this non CTA examination. No bulky retroperitoneal, mesenteric, pelvic or inguinal lymphadenopathy. Reproductive: Appropriately positioned intrauterine device. Note is made of an approximately 4.1 x 3.8 cm hypoattenuating (15 Hounsfield unit) presumably physiologic left-sided adnexal cyst. There are two adjacent punctate  (subcentimeter) hypoattenuating presumably physiologic cysts within the left right adnexa (images 59 and 62, series 2). No free fluid the pelvic cul-de-sac. Other: Tiny mesenteric fat containing periumbilical hernia. There is a minimal  amount of subcutaneous edema about the midline of the low back. Musculoskeletal: No acute or aggressive osseous abnormalities.  Coil IMPRESSION: 1. No definite explanation for patient's right lower quadrant pain. Specifically, no evidence of enteric or urinary obstruction. Normal appearance of the appendix. 2. Appropriately position intrauterine device with bilateral suspected physiologic adnexal cysts. Clinical correlation is advised. Further evaluation with dedicated pelvic ultrasound could be performed as indicated. 3. Colonic diverticulosis without evidence superimposed acute diverticulitis. 4. Suspected hepatic steatosis.  Correlation with LFTs is advised. Electronically Signed   By: Simonne Come M.D.   On: 10/06/2020 11:47   US PELVIC COMPLETE W TRANSVAGINAL AND TORSION R/O  Result Date: 10/06/2020 CLINICAL DATA:  Right lower quadrant pain EXAM: TRANSABDOMINAL AND TRANSVAGINAL ULTRASOUND OF PELVIS DOPPLER ULTRASOUND OF OVARIES TECHNIQUE: Both transabdominal and transvaginal ultrasound examinations of the pelvis were performed. Transabdominal technique was performed for global imaging of the pelvis including uterus, ovaries, adnexal regions, and pelvic cul-de-sac. It was necessary to proceed with endovaginal exam following the transabdominal exam to visualize the uterus, endometrium, ovaries and adnexa. Color and duplex Doppler ultrasound was utilized to evaluate blood flow to the ovaries. COMPARISON:  CT 11/29/2016, ultrasound 04/29/2015 FINDINGS: Uterus Measurements: 9.6 x 4.3 x 5.0 cm = volume: 108 mL. No fibroids or other mass visualized. Endometrium Thickness: 4 mm in thickness. IUD noted in expected position. No focal abnormality visualized. Right ovary Measurements: 1.3  x 2.8 x 2.1 cm = volume: 9.6 mL. Ovary not visualized on transvaginal exam. Very limited visualization and evaluation on transabdominal imaging. No visible mass. Left ovary Measurements: 4.8 x 4.7 x 4.6 cm = volume: 54 mL. 4.1 cm septated cyst in the left ovary. Pulsed Doppler evaluation of both ovaries demonstrates normal low-resistance arterial and venous waveforms on the left. Color flow seen in the right ovary. Doppler examination limited due to limited visualization. Other findings No abnormal free fluid. IMPRESSION: 4.1 cm minimally complex cyst in the left ovary. This could be followed with repeat ultrasound in 3-6 months to ensure resolution. Limited visualization of the right ovary. No visible abnormality seen. Electronically Signed   By: Charlett Nose M.D.   On: 10/06/2020 13:34    Procedures Procedures   Medications Ordered in ED Medications  morphine 4 MG/ML injection 4 mg (4 mg Intravenous Given 10/06/20 1053)  iohexol (OMNIPAQUE) 300 MG/ML solution 100 mL (100 mLs Intravenous Contrast Given 10/06/20 1121)    ED Course  I have reviewed the triage vital signs and the nursing notes.  Pertinent labs & imaging results that were available during my care of the patient were reviewed by me and considered in my medical decision making (see chart for details).  Clinical Course as of 10/06/20 1411  Sat Oct 06, 2020  1042 Glori Luis): SMALL [CA]  1144 Hgb urine dipstick(!): LARGE [CA]  1144 Appearance(!): CLOUDY [CA]  1144 Bacteria, UA(!): MANY [CA]  1158 Clue Cells Wet Prep HPF POC(!): PRESENT [CA]  1249 WBC, Wet Prep HPF POC(!): MODERATE [CA]    Clinical Course User Index [CA] Mannie Stabile, PA-C   MDM Rules/Calculators/A&P                         30 year old female presents to the ED due to right lower quadrant abdominal pain that radiates to right low back x6 days.  Previous history of C-section however, no other abdominal operations.  She also endorses increased vaginal  discharge and vaginal spotting.  No fever or  chills.  Upon arrival, vitals all within normal limits.  Patient is afebrile, not tachycardic or hypoxic.  Patient in no acute distress.  Physical exam significant for right lower quadrant abdominal tenderness located at McBurney's point.  No CVA tenderness bilaterally.  Low suspicion for pyelonephritis.  No rebound or guarding.  Abdominal labs ordered.  CT abdomen to rule out appendicitis.  Will perform pelvic exam to rule out PID given increased vaginal discharge and obtain wet prep and gonorrhea/chlamydia cultures.  IV morphine given for pain.  Pelvic exam performed with chaperone in room.  Mild amount of blood in vaginal vault.  IUD strings visualized from cervix. No CMT.  Low suspicion for PID.  Wet prep positive for clue cells.  Given patient has had increased vaginal discharge we will treat for bacterial vaginosis with Flagyl.  UA significant for large hematuria likely due to vaginal bleeding and small leukocytes.  Patient denies any urinary symptoms.  We will hold off on antibiotics for acute cystitis pending culture results.  Pregnancy test negative.  CMP reassuring with normal renal function no major electrolyte derangements.  CBC unremarkable no leukocytosis and normal hemoglobin. CT abdomen personally reviewed which demonstrates: IMPRESSION:  1. No definite explanation for patient's right lower quadrant pain.  Specifically, no evidence of enteric or urinary obstruction. Normal  appearance of the appendix.  2. Appropriately position intrauterine device with bilateral  suspected physiologic adnexal cysts. Clinical correlation is  advised. Further evaluation with dedicated pelvic ultrasound could  be performed as indicated.  3. Colonic diverticulosis without evidence superimposed acute  diverticulitis.  4. Suspected hepatic steatosis.  Correlation with LFTs is advised.   US personally reviewed which demonstrates: IMPRESSION:  4.1 cm minimally  complex cyst in the left ovary. This could be  followed with repeat ultrasound in 3-6 months to ensure resolution.     Limited visualization of the right ovary. No visible abnormality  seen.   Instructed patient to follow-up with OBGYN for further evaluation of left ovarian cyst. Advised patient to take OTC ibuprofen or tylenol as needed for pain. Strict ED precautions discussed with patient. Patient states understanding and agrees to plan. Patient discharged home in no acute distress and stable vitals. Final Clinical Impression(s) / ED Diagnoses Final diagnoses:  Right lower quadrant abdominal pain  Bacterial vaginosis    Rx / DC Orders ED Discharge Orders          Ordered    metroNIDAZOLE (FLAGYL) 500 MG tablet  2 times daily        10/06/20 1318             Mannie Stabileberman, Sam Wunschel C, [place] JerseyPA-C 10/06/20 1412    Rolan BuccoBelfi, Melanie, MD 10/06/20 1418

## 2020-10-06 NOTE — Discharge Instructions (Addendum)
It was a pleasure taking care of you today.  As discussed, your CT scan was negative for appendicitis or kidney stone.  Your ultrasound showed a left ovarian cyst.  Your wet prep was positive for clue cells which is indicative of bacterial vaginosis.  I am sending you home with an antibiotic.  Take as prescribed and finish all antibiotics.  Do not drink alcohol while on the antibiotic.  You may take over-the-counter ibuprofen or Tylenol as needed for pain.  Please follow-up with OB/GYN for further evaluation of your ovarian cyst.  Return to the ER for new or worsening symptoms.

## 2020-10-07 LAB — URINE CULTURE

## 2020-10-08 LAB — GC/CHLAMYDIA PROBE AMP (~~LOC~~) NOT AT ARMC
Chlamydia: NEGATIVE
Comment: NEGATIVE
Comment: NORMAL
Neisseria Gonorrhea: NEGATIVE

## 2021-06-27 ENCOUNTER — Other Ambulatory Visit: Payer: Self-pay

## 2021-06-27 ENCOUNTER — Ambulatory Visit
Admission: EM | Admit: 2021-06-27 | Discharge: 2021-06-27 | Disposition: A | Discharge status: Home / Self Care | Payer: No Typology Code available for payment source | Attending: Internal Medicine | Admitting: Internal Medicine

## 2021-06-27 ENCOUNTER — Encounter: Payer: Self-pay | Admitting: Emergency Medicine

## 2021-06-27 DIAGNOSIS — F17210 Nicotine dependence, cigarettes, uncomplicated: Secondary | ICD-10-CM | POA: Insufficient documentation

## 2021-06-27 DIAGNOSIS — R350 Frequency of micturition: Secondary | ICD-10-CM | POA: Insufficient documentation

## 2021-06-27 DIAGNOSIS — R109 Unspecified abdominal pain: Secondary | ICD-10-CM | POA: Diagnosis not present

## 2021-06-27 LAB — POCT URINALYSIS DIP (MANUAL ENTRY)
Bilirubin, UA: NEGATIVE
Glucose, UA: NEGATIVE mg/dL
Ketones, POC UA: NEGATIVE mg/dL
Leukocytes, UA: NEGATIVE
Nitrite, UA: NEGATIVE
Protein Ur, POC: NEGATIVE mg/dL
Spec Grav, UA: 1.015 (ref 1.010–1.025)
Urobilinogen, UA: 0.2 E.U./dL
pH, UA: 6 (ref 5.0–8.0)

## 2021-06-27 LAB — POCT URINE PREGNANCY: Preg Test, Ur: NEGATIVE

## 2021-06-27 MED ORDER — IBUPROFEN 600 MG PO TABS: 600.0000 mg | ORAL_TABLET | Freq: Four times a day (QID) | ORAL | 0 refills | Status: AC | PRN

## 2021-06-27 NOTE — ED Provider Notes (Signed)
?[facility] ? ? ? ?CSN: [id number] ?Arrival date & time: 06/27/21  1559 ? ? ?  ? ?History   ?Chief Complaint ?Chief Complaint  ?Patient presents with  ? Right Flank Pain  ? ? ?HPI ?Taylor Donovan is a 31 y.o. female.  ? ?Patient presents with mild right lower back/flank plain that has been present for approximately 2 days.  Denies any apparent injury to the area.  Denies any history of chronic back pain.  Patient reports it is a dull ache that is not exacerbated by movement.  Denies any aggravating or relieving factors.  Patient does report that she has noticed some vaginal spotting over the past 2 days as well.  Also having some mild urinary frequency.  Patient denies urinary burning, hematuria, pelvic pain, abdominal pain, fever.  Patient reports that she did have some constipation over the past few days but she is lactose intolerant, so she drink a milkshake and had a normal bowel movement with no blood in stool yesterday.  She has taken ibuprofen for symptoms with improvement.  She does have an IUD and last menstrual cycle was 06/07/2021. ? ? ? ?Past Medical History:  ?Diagnosis Date  ? Acid reflux   ? Depression   ? pp  ? Hypothyroid   ? Multiple sclerosis (HCC)   ? Ovarian cyst   ? PCOS (polycystic ovarian syndrome)   ? S/P cesarean section 06/01/2019  ? Thyroid disease   ? Vision abnormalities   ? ? ?Patient Active Problem List  ? Diagnosis Date Noted  ? High risk medication use 07/07/2019  ? Vitamin D deficiency 07/07/2019  ? S/P cesarean section 06/01/2019  ? Normal pregnancy in multigravida in third trimester 05/31/2019  ? Pregnancy 12/21/2018  ? Right shoulder pain 12/16/2017  ? Multiple joint pain 08/19/2017  ? Other fatigue 06/10/2017  ? Depression 06/10/2017  ? Bilateral hip bursitis 12/10/2016  ? BMI 35.0-35.9,adult 12/10/2016  ? Insomnia 06/10/2016  ? Urinary urgency 06/10/2016  ? Multiple sclerosis (HCC) 01/29/2016  ? Ataxic gait 01/29/2016  ? 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 Vision loss of  left eye 01/08/2016  ? Abnormal brain MRI 01/08/2016  ? Hypothyroidism 01/08/2016  ? Vision loss, left eye 01/08/2016  ? HASHIMOTO'S THYROIDITIS 08/13/2009  ? DYSPNEA ON EXERTION 08/13/2009  ? ? ?Past Surgical History:  ?Procedure Laterality Date  ? CESAREAN SECTION N/A 06/01/2019  ? Procedure: CESAREAN SECTION;  Surgeon: Sherian Rein, MD;  Location: [facility] ORS;  Service: Obstetrics;  Laterality: N/A;  ? ? ?OB History   ? ? Gravida  ?2  ? Para  ?2  ? Term  ?1  ? Preterm  ?   ? AB  ?   ? Living  ?1  ?  ? ? SAB  ?   ? IAB  ?   ? Ectopic  ?   ? Multiple  ?0  ? Live Births  ?1  ?   ?  ?  ? ? ? ?Home Medications   ? ?Prior to Admission medications   ?Medication Sig Start Date End Date Taking? Authorizing Provider  ?ibuprofen (ADVIL) 600 MG tablet Take 1 tablet (600 mg total) by mouth every 6 (six) hours as needed for mild pain. 06/27/21  Yes Gustavus Bryant, FNP  ?acetaminophen (TYLENOL) 325 MG tablet Take 650 mg by mouth every 6 (six) hours as needed for mild pain or headache.    [provider]  ?levothyroxine (SYNTHROID) 125 MCG tablet Take 250 mcg by  mouth daily before breakfast.    [provider]  ?metroNIDAZOLE (FLAGYL) 500 MG tablet Take 1 tablet (500 mg total) by mouth 2 (two) times daily. 10/06/20   Mannie Stabile, PA-C  ?paragard intrauterine copper IUD IUD 1 each by Intrauterine route once.    [provider]  ?Teriflunomide (AUBAGIO) 14 MG TABS Take by mouth.    [provider]  ?triamcinolone cream (KENALOG) 0.1 % Apply 1 application topically 2 (two) times daily. 12/21/19   Wieters, Junius Creamer, PA-C  ?Vitamin D, Ergocalciferol, (DRISDOL) 1.25 MG (50000 UNIT) CAPS capsule TAKE 1 CAPSULE (50,000 UNITS TOTAL) BY MOUTH EVERY 7 (SEVEN) DAYS. 01/10/20   Sater, Pearletha Furl, MD  ? ? ?Family History ?Family History  ?Problem Relation Age of Onset  ? Hypertension Mother   ? Diabetes Mellitus II Mother   ? Healthy Father   ? Healthy Maternal Aunt   ? Healthy Maternal Grandmother    ? ? ?Social History ?Social History  ? ?Tobacco Use  ? Smoking status: Every Day  ?  Packs/day: 0.50  ?  Years: 8.00  ?  Pack years: 4.00  ?  Types: Cigarettes  ? Smokeless tobacco: Never  ?Vaping Use  ? Vaping Use: Never used  ?Substance Use Topics  ? Alcohol use: Yes  ?  Comment: occasionally  ? Drug use: No  ? ? ? ?Allergies   ?Patient has no known allergies. ? ? ?Review of Systems ?Review of Systems ?Per HPI ? ?Physical Exam ?Triage Vital Signs ?ED Triage Vitals  ?Enc Vitals Group  ?   BP 06/27/21 1652 (!) 152/97  ?   Pulse Rate 06/27/21 1652 83  ?   Resp 06/27/21 1652 18  ?   Temp 06/27/21 1652 97.9 ?F (36.6 ?C)  ?   Temp Source 06/27/21 1652 Oral  ?   SpO2 06/27/21 1652 98 %  ?   Weight 06/27/21 1654 220 lb 0.3 oz (99.8 kg)  ?   Height 06/27/21 1654 5\' 7"  (1.702 m)  ?   Head Circumference --   ?   Peak Flow --   ?   Pain Score 06/27/21 1653 3  ?   Pain Loc --   ?   Pain Edu? --   ?   Excl. in GC? --   ? ?No data found. ? ?Updated Vital Signs ?BP (!) 152/97 (BP Location: Left Arm)   Pulse 83   Temp 97.9 ?F (36.6 ?C) (Oral)   Resp 18   Ht 5\' 7"  (1.702 m)   Wt 220 lb 0.3 oz (99.8 kg)   SpO2 98%   BMI 34.46 kg/m?  ? ?Visual Acuity ?Right Eye Distance:   ?Left Eye Distance:   ?Bilateral Distance:   ? ?Right Eye Near:   ?Left Eye Near:    ?Bilateral Near:    ? ?Physical Exam ?Constitutional:   ?   General: She is not in acute distress. ?   Appearance: Normal appearance. She is not toxic-appearing or diaphoretic.  ?HENT:  ?   Head: Normocephalic and atraumatic.  ?Eyes:  ?   Extraocular Movements: Extraocular movements intact.  ?   Conjunctiva/sclera: Conjunctivae normal.  ?Cardiovascular:  ?   Rate and Rhythm: Normal rate and regular rhythm.  ?   Pulses: Normal pulses.  ?   Heart sounds: Normal heart sounds.  ?Pulmonary:  ?   Effort: Pulmonary effort is normal. No respiratory distress.  ?   Breath sounds: Normal breath sounds.  ?Abdominal:  ?  General: Bowel sounds are normal. There is no distension.  ?    Palpations: Abdomen is soft.  ?   Tenderness: There is no abdominal tenderness.  ?Musculoskeletal:  ?   Cervical back: Normal.  ?   Lumbar back: Tenderness present. No swelling or edema.  ?     Back: ? ?   Comments: Tenderness to palpation to right lower lumbar region.  No direct spinal tenderness, crepitus, step-off.  ?Neurological:  ?   General: No focal deficit present.  ?   Mental Status: She is alert and oriented to person, place, and time. Mental status is at baseline.  ?   Cranial Nerves: Cranial nerves 2-12 are intact.  ?   Sensory: Sensation is intact.  ?   Motor: Motor function is intact.  ?   Coordination: Coordination is intact.  ?   Gait: Gait is intact.  ?Psychiatric:     ?   Mood and Affect: Mood normal.     ?   Behavior: Behavior normal.     ?   Thought Content: Thought content normal.     ?   Judgment: Judgment normal.  ? ? ? ?UC Treatments / Results  ?Labs ?(all labs ordered are listed, but only abnormal results are displayed) ?Labs Reviewed  ?POCT URINALYSIS DIP (MANUAL ENTRY) - Abnormal; Notable for the following components:  ?    Result Value  ? Blood, UA moderate (*)   ? All other components within normal limits  ?URINE CULTURE  ?POCT URINE PREGNANCY  ?CERVICOVAGINAL ANCILLARY ONLY  ? ? ?EKG ? ? ?Radiology ?No results found. ? ?Procedures ?Procedures (including critical care time) ? ?Medications Ordered in UC ?Medications - No data to display ? ?Initial Impression / Assessment and Plan / UC Course  ?I have reviewed the triage vital signs and the nursing notes. ? ?Pertinent labs & imaging results that were available during my care of the patient were reviewed by me and considered in my medical decision making (see chart for details). ? ?  ? ?Urinalysis not indicating urinary tract infection.  Urine pregnancy was negative.  Differential diagnoses include musculoskeletal injury/sprain of lower lumbar region, kidney stone, urinary tract infection, vaginal infection.  Urine culture and  cervicovaginal swab pending to rule out these etiologies.  Will await results for any further treatment of urinary tract or vaginal symptoms.  Suspect musculoskeletal nature of symptoms.  Will treat with ibuprofen as patie

## 2021-06-27 NOTE — Discharge Instructions (Signed)
Urine test and vaginal swab are pending.  We will call if results are positive and treat as appropriate.  Please go to the hospital if symptoms persist or worsen. ?

## 2021-06-27 NOTE — ED Triage Notes (Signed)
Patient c/o right flank pain x 2 days, no apparent injury, just a dull ache in that area.  Patient is having some vaginal spotting x 2 days.  Has problems with constipation, able to go this morning.  No urinary sx's.  Patient is taken Ibuprofen. ?

## 2021-06-28 LAB — URINE CULTURE

## 2021-07-01 LAB — CERVICOVAGINAL ANCILLARY ONLY
Bacterial Vaginitis (gardnerella): POSITIVE — AB
Candida Glabrata: NEGATIVE
Candida Vaginitis: NEGATIVE
Comment: NEGATIVE
Comment: NEGATIVE
Comment: NEGATIVE

## 2021-07-02 ENCOUNTER — Telehealth (HOSPITAL_COMMUNITY): Payer: Self-pay | Admitting: Emergency Medicine

## 2021-07-02 MED ORDER — METRONIDAZOLE 500 MG PO TABS: 500.0000 mg | ORAL_TABLET | Freq: Two times a day (BID) | ORAL | 0 refills | Status: AC

## 2023-05-01 ENCOUNTER — Ambulatory Visit
Admission: RE | Admit: 2023-05-01 | Discharge: 2023-05-01 | Disposition: A | Discharge status: Home / Self Care | Payer: No Typology Code available for payment source | Source: Ambulatory Visit | Attending: Internal Medicine

## 2023-05-01 VITALS — BP 119/82 | HR 95 | Temp 98.1°F | Resp 17

## 2023-05-01 DIAGNOSIS — D849 Immunodeficiency, unspecified: Secondary | ICD-10-CM | POA: Insufficient documentation

## 2023-05-01 DIAGNOSIS — J069 Acute upper respiratory infection, unspecified: Secondary | ICD-10-CM | POA: Diagnosis present

## 2023-05-01 LAB — POC COVID19/FLU A&B COMBO
Covid Antigen, POC: NEGATIVE
Influenza A Antigen, POC: NEGATIVE
Influenza B Antigen, POC: NEGATIVE

## 2023-05-01 MED ORDER — ALBUTEROL SULFATE (2.5 MG/3ML) 0.083% IN NEBU
2.5000 mg | INHALATION_SOLUTION | Freq: Once | RESPIRATORY_TRACT | Status: AC
  Administered 2023-05-01: 2.5 mg via RESPIRATORY_TRACT

## 2023-05-01 MED ORDER — ALBUTEROL SULFATE HFA 108 (90 BASE) MCG/ACT IN AERS: 1.0000 | INHALATION_SPRAY | Freq: Four times a day (QID) | RESPIRATORY_TRACT | 0 refills | Status: AC | PRN

## 2023-05-01 MED ORDER — PROMETHAZINE-DM 6.25-15 MG/5ML PO SYRP: 5.0000 mL | ORAL_SOLUTION | Freq: Every evening | ORAL | 0 refills | Status: AC | PRN

## 2023-05-01 NOTE — ED Provider Notes (Signed)
[facility]    CSN: [id number] Arrival date & time: 05/01/23  1616      History   Chief Complaint Chief Complaint  Patient presents with   Cough    Runny congested nose, body aches, hot then cold. I just want to make sure I don't have the flu and Covid - Entered by patient    HPI Taylor Donovan is a 33 y.o. female.   Taylor Donovan is a 33 y.o. female presenting for chief complaint of cough, congestion, runny nose, headache, and fatigue that started yesterday. Symptom onset was sudden yesterday. Her daughter was sick a few days ago with similar symptoms. History of MS, takes biologic immune therapy without missed doses. Denies recent antibiotic or steroid use. Denies N/V/D, abdominal pain, rash, dizziness, and fevers/chills. Headache is generalized to the forehead and has responded well to use of tylenol/motrin. Taking OTC medicines without much relief.    Cough   Past Medical History:  Diagnosis Date   Acid reflux    Depression    pp   Hypothyroid    Multiple sclerosis (HCC)    Ovarian cyst    PCOS (polycystic ovarian syndrome)    S/P cesarean section 06/01/2019   Thyroid disease    Vision abnormalities     Patient Active Problem List   Diagnosis Date Noted   High risk medication use 07/07/2019   Vitamin D deficiency 07/07/2019   S/P cesarean section 06/01/2019   Normal pregnancy in multigravida in third trimester 05/31/2019   Pregnancy 12/21/2018   Right shoulder pain 12/16/2017   Multiple joint pain 08/19/2017   Other fatigue 06/10/2017   Depression 06/10/2017   Bilateral hip bursitis 12/10/2016   BMI 35.0-35.9,adult 12/10/2016   Insomnia 06/10/2016   Urinary urgency 06/10/2016   Multiple sclerosis (HCC) 01/29/2016   Ataxic gait 01/29/2016   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   Vision loss of left eye 01/08/2016   Abnormal brain MRI 01/08/2016   Hypothyroidism 01/08/2016   Vision loss, left eye 01/08/2016   HASHIMOTO'S THYROIDITIS 08/13/2009    DYSPNEA ON EXERTION 08/13/2009    Past Surgical History:  Procedure Laterality Date   CESAREAN SECTION N/A 06/01/2019   Procedure: CESAREAN SECTION;  Surgeon: Sherian Rein, MD;  Location: [facility] ORS;  Service: Obstetrics;  Laterality: N/A;    OB History     Gravida  2   Para  2   Term  1   Preterm      AB      Living  1      SAB      IAB      Ectopic      Multiple  0   Live Births  1            Home Medications    Prior to Admission medications   Medication Sig Start Date End Date Taking? Authorizing Provider  albuterol (VENTOLIN HFA) 108 (90 Base) MCG/ACT inhaler Inhale 1-2 puffs into the lungs every 6 (six) hours as needed for wheezing or shortness of breath. 05/01/23  Yes Carlisle Beers, FNP  promethazine-dextromethorphan (PROMETHAZINE-DM) 6.25-15 MG/5ML syrup Take 5 mLs by mouth at bedtime as needed for cough. 05/01/23  Yes Carlisle Beers, FNP  acetaminophen (TYLENOL) 325 MG tablet Take 650 mg by mouth every 6 (six) hours as needed for mild pain or headache.    [provider]  ibuprofen (ADVIL) 600 MG tablet Take 1 tablet (600 mg total) by mouth every 6 (six)  hours as needed for mild pain. 06/27/21   Gustavus Bryant, FNP  levothyroxine (SYNTHROID) 125 MCG tablet Take 250 mcg by mouth daily before breakfast. Patient not taking: Reported on 05/01/2023    [provider]  metroNIDAZOLE (FLAGYL) 500 MG tablet Take 1 tablet (500 mg total) by mouth 2 (two) times daily. Patient not taking: Reported on 05/01/2023 07/02/21   Merrilee Jansky, MD  paragard intrauterine copper IUD IUD 1 each by Intrauterine route once.    [provider]  Teriflunomide (AUBAGIO) 14 MG TABS Take by mouth.    [provider]  triamcinolone cream (KENALOG) 0.1 % Apply 1 application topically 2 (two) times daily. Patient not taking: Reported on 05/01/2023 12/21/19   Wieters, Fran Lowes C, PA-C  Vitamin D, Ergocalciferol, (DRISDOL) 1.25 MG  (50000 UNIT) CAPS capsule TAKE 1 CAPSULE (50,000 UNITS TOTAL) BY MOUTH EVERY 7 (SEVEN) DAYS. 01/10/20   Sater, Pearletha Furl, MD    Family History Family History  Problem Relation Age of Onset   Hypertension Mother    Diabetes Mellitus II Mother    Healthy Father    Healthy Maternal Aunt    Healthy Maternal Grandmother     Social History Social History   Tobacco Use   Smoking status: Every Day    Current packs/day: 0.50    Average packs/day: 0.5 packs/day for 8.0 years (4.0 ttl pk-yrs)    Types: Cigarettes   Smokeless tobacco: Never  Vaping Use   Vaping status: Never Used  Substance Use Topics   Alcohol use: Yes    Comment: occasionally   Drug use: No     Allergies   Patient has no known allergies.   Review of Systems Review of Systems  Respiratory:  Positive for cough.   Per HPI   Physical Exam Triage Vital Signs ED Triage Vitals  Encounter Vitals Group     BP 05/01/23 1626 119/82     Systolic BP Percentile --      Diastolic BP Percentile --      Pulse Rate 05/01/23 1626 95     Resp 05/01/23 1626 17     Temp 05/01/23 1626 98.1 F (36.7 C)     Temp Source 05/01/23 1626 Oral     SpO2 05/01/23 1626 96 %     Weight --      Height --      Head Circumference --      Peak Flow --      Pain Score 05/01/23 1624 5     Pain Loc --      Pain Education --      Exclude from Growth Chart --    No data found.  Updated Vital Signs BP 119/82 (BP Location: Right Arm)   Pulse 95   Temp 98.1 F (36.7 C) (Oral)   Resp 17   SpO2 96%   Visual Acuity Right Eye Distance:   Left Eye Distance:   Bilateral Distance:    Right Eye Near:   Left Eye Near:    Bilateral Near:     Physical Exam Vitals and nursing note reviewed.  Constitutional:      Appearance: She is not ill-appearing or toxic-appearing.  HENT:     Head: Normocephalic and atraumatic.     Right Ear: Hearing, tympanic membrane, ear canal and external ear normal.     Left Ear: Hearing, tympanic  membrane, ear canal and external ear normal.     Nose: Nose normal.  Mouth/Throat:     Lips: Pink.     Mouth: Mucous membranes are moist. No injury or oral lesions.     Dentition: Normal dentition.     Tongue: No lesions.     Pharynx: Oropharynx is clear. Uvula midline. No pharyngeal swelling, oropharyngeal exudate, posterior oropharyngeal erythema, uvula swelling or postnasal drip.     Tonsils: No tonsillar exudate.  Eyes:     General: Lids are normal. Vision grossly intact. Gaze aligned appropriately.     Extraocular Movements: Extraocular movements intact.     Conjunctiva/sclera: Conjunctivae normal.  Neck:     Trachea: Trachea and phonation normal.  Cardiovascular:     Rate and Rhythm: Normal rate and regular rhythm.     Heart sounds: Normal heart sounds, S1 normal and S2 normal.  Pulmonary:     Effort: Pulmonary effort is normal. No respiratory distress.     Breath sounds: Normal air entry. Wheezing (Expiratory wheeze heard to auscultation of anterior right chest, otherwise normal breath sounds.) present. No rhonchi or rales.     Comments: Speaking in full sentences without difficulty or increased respiratory effort. Chest:     Chest wall: No tenderness.  Musculoskeletal:     Cervical back: Neck supple.  Lymphadenopathy:     Cervical: No cervical adenopathy.  Skin:    General: Skin is warm and dry.     Capillary Refill: Capillary refill takes less than 2 seconds.     Findings: No rash.  Neurological:     General: No focal deficit present.     Mental Status: She is alert and oriented to person, place, and time. Mental status is at baseline.     Cranial Nerves: No dysarthria or facial asymmetry.  Psychiatric:        Mood and Affect: Mood normal.        Speech: Speech normal.        Behavior: Behavior normal.        Thought Content: Thought content normal.        Judgment: Judgment normal.      UC Treatments / Results  Labs (all labs ordered are listed, but only  abnormal results are displayed) Labs Reviewed  SARS CORONAVIRUS 2 (TAT 6-24 HRS)  POC COVID19/FLU A&B COMBO    EKG   Radiology No results found.  Procedures Procedures (including critical care time)  Medications Ordered in UC Medications  albuterol (PROVENTIL) (2.5 MG/3ML) 0.083% nebulizer solution 2.5 mg (2.5 mg Nebulization Given 05/01/23 1644)    Initial Impression / Assessment and Plan / UC Course  I have reviewed the triage vital signs and the nursing notes.  Pertinent labs & imaging results that were available during my care of the patient were reviewed by me and considered in my medical decision making (see chart for details).   1. Viral URI with cough, immunosuppression Suspect viral URI, viral syndrome.  Strep/viral testing: POC COVID and flu testing negative, PCR COVID testing pending.  Wheezing heard on exam, albuterol breathing treatment provided with significant improvement in lung sounds and shortness of breath on reassessment.  Deferring steroid use currently due to history of MS, advised use of albuterol inhaler every 4-6 hours as needed for cough, shortness of breath, and wheeze at home.   Physical exam findings reassuring, vital signs hemodynamically stable, and lungs clear, therefore deferred imaging of the chest.  Advised supportive care/prescriptions for symptomatic relief as outlined in AVS.    Counseled patient on potential for adverse effects with  medications prescribed/recommended today, strict ER and return-to-clinic precautions discussed, patient verbalized understanding.    Final Clinical Impressions(s) / UC Diagnoses   Final diagnoses:  Viral URI with cough  Immunosuppression (HCC)     Discharge Instructions      You have a viral illness which will improve on its own with rest, fluids, and medications to help with your symptoms.  Tylenol, guaifenesin (plain mucinex), and saline nasal sprays may help relieve symptoms.  Albuterol inhaler  every 4-6 hours as needed for cough. Promethazine DM cough syrup at bedtime as needed.  Two teaspoons of honey in 1 cup of warm water every 4-6 hours may help with throat pains.  Humidifier in room at nighttime may help soothe cough (clean well daily).   For chest pain, shortness of breath, inability to keep food or fluids down without vomiting, fever that does not respond to tylenol or motrin, or any other severe symptoms, please go to the ER for further evaluation. Return to urgent care as needed, otherwise follow-up with PCP.       ED Prescriptions     Medication Sig Dispense Auth. Provider   albuterol (VENTOLIN HFA) 108 (90 Base) MCG/ACT inhaler Inhale 1-2 puffs into the lungs every 6 (six) hours as needed for wheezing or shortness of breath. 8 g Reita May M, FNP   promethazine-dextromethorphan (PROMETHAZINE-DM) 6.25-15 MG/5ML syrup Take 5 mLs by mouth at bedtime as needed for cough. 118 mL Carlisle Beers, FNP      PDMP not reviewed this encounter.   Carlisle Beers, [place] 05/01/23 [phone]

## 2023-05-01 NOTE — Discharge Instructions (Signed)
You have a viral illness which will improve on its own with rest, fluids, and medications to help with your symptoms.  Tylenol, guaifenesin (plain mucinex), and saline nasal sprays may help relieve symptoms.  Albuterol inhaler every 4-6 hours as needed for cough. Promethazine DM cough syrup at bedtime as needed.  Two teaspoons of honey in 1 cup of warm water every 4-6 hours may help with throat pains.  Humidifier in room at nighttime may help soothe cough (clean well daily).   For chest pain, shortness of breath, inability to keep food or fluids down without vomiting, fever that does not respond to tylenol or motrin, or any other severe symptoms, please go to the ER for further evaluation. Return to urgent care as needed, otherwise follow-up with PCP.

## 2023-05-01 NOTE — ED Triage Notes (Addendum)
Pt c/o cough, chills, runny nose, headache, congestion, loss of appetite since yesterday.   Has taken muscinex  Took tylenol at 2:30

## 2023-05-02 LAB — SARS CORONAVIRUS 2 (TAT 6-24 HRS): SARS Coronavirus 2: NEGATIVE

## 2023-07-21 ENCOUNTER — Ambulatory Visit
Admission: EM | Admit: 2023-07-21 | Discharge: 2023-07-21 | Disposition: A | Discharge status: Home / Self Care | Attending: Family Medicine | Admitting: Family Medicine

## 2023-07-21 DIAGNOSIS — T8339XA Other mechanical complication of intrauterine contraceptive device, initial encounter: Secondary | ICD-10-CM | POA: Insufficient documentation

## 2023-07-21 DIAGNOSIS — N771 Vaginitis, vulvitis and vulvovaginitis in diseases classified elsewhere: Secondary | ICD-10-CM | POA: Diagnosis not present

## 2023-07-21 DIAGNOSIS — B9689 Other specified bacterial agents as the cause of diseases classified elsewhere: Secondary | ICD-10-CM | POA: Diagnosis not present

## 2023-07-21 DIAGNOSIS — N760 Acute vaginitis: Secondary | ICD-10-CM

## 2023-07-21 DIAGNOSIS — N8300 Follicular cyst of ovary, unspecified side: Secondary | ICD-10-CM | POA: Insufficient documentation

## 2023-07-21 DIAGNOSIS — Z6791 Unspecified blood type, Rh negative: Secondary | ICD-10-CM | POA: Insufficient documentation

## 2023-07-21 DIAGNOSIS — R102 Pelvic and perineal pain: Secondary | ICD-10-CM | POA: Insufficient documentation

## 2023-07-21 DIAGNOSIS — E890 Postprocedural hypothyroidism: Secondary | ICD-10-CM | POA: Insufficient documentation

## 2023-07-21 DIAGNOSIS — N921 Excessive and frequent menstruation with irregular cycle: Secondary | ICD-10-CM | POA: Insufficient documentation

## 2023-07-21 LAB — POCT URINALYSIS DIP (MANUAL ENTRY)
Bilirubin, UA: NEGATIVE
Glucose, UA: NEGATIVE mg/dL
Ketones, POC UA: NEGATIVE mg/dL
Leukocytes, UA: NEGATIVE
Nitrite, UA: NEGATIVE
Protein Ur, POC: NEGATIVE mg/dL
Spec Grav, UA: 1.01 (ref 1.010–1.025)
Urobilinogen, UA: 0.2 U/dL
pH, UA: 5.5 (ref 5.0–8.0)

## 2023-07-21 LAB — POCT URINE PREGNANCY: Preg Test, Ur: NEGATIVE

## 2023-07-21 MED ORDER — FLUCONAZOLE 150 MG PO TABS: 150.0000 mg | ORAL_TABLET | ORAL | 0 refills | Status: AC | PRN

## 2023-07-21 NOTE — ED Triage Notes (Signed)
"  I have done the OTC treatment for Yeast 2x in the past week for Yeast, I am still burning in my vaginal area with urinating as well and lower abd ache". No discharge. No odor. No fever.

## 2023-07-21 NOTE — ED Provider Notes (Signed)
 [facility]    CSN: [id number] Arrival date & time: 07/21/23  1438      History   Chief Complaint Chief Complaint  Patient presents with  . Vaginal Problem  . Abdominal Pain    HPI Taylor Donovan is a 33 y.o. female.    Abdominal Pain Patient here for evaluation of weeks of vaginal itching.  She has attempted over-the-counter treatment of vaginal symptoms without any improvement.  She endorses that she is having some lower abdominal aching and pain with urinating.   Past Medical History:  Diagnosis Date  . Acid reflux   . Depression    pp  . Hypothyroid   . Multiple sclerosis (HCC)   . Ovarian cyst   . PCOS (polycystic ovarian syndrome)   . S/P cesarean section 06/01/2019  . Thyroid  disease   . Vision abnormalities     Patient Active Problem List   Diagnosis Date Noted  . Mechanical complication of intrauterine contraceptive device 07/21/2023  . Follicular cyst of ovary 07/21/2023  . Acute pelvic pain 07/21/2023  . Menorrhagia with irregular cycle 07/21/2023  . 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 RhD negative 07/21/2023  . High risk medication use 07/07/2019  . Vitamin D  deficiency 07/07/2019  . S/P cesarean section 06/01/2019  . Normal pregnancy in multigravida in third trimester 05/31/2019  . Low-lying placenta 01/03/2019  . Pregnancy 12/21/2018  . History of depression 11/02/2018  . Tobacco use in pregnancy 11/02/2018  . Hypothyroidism in pregnancy 10/11/2018  . Right shoulder pain 12/16/2017  . Multiple joint pain 08/19/2017  . Other fatigue 06/10/2017  . Depression 06/10/2017  . Bilateral hip bursitis 12/10/2016  . BMI 35.0-35.9,adult 12/10/2016  . Insomnia 06/10/2016  . Urinary urgency 06/10/2016  . 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 Multiple sclerosis (HCC) 01/16/2016  . B12 deficiency 01/16/2016  . Vision loss of left eye 01/08/2016  . Abnormal brain MRI 01/08/2016  . Hypothyroidism 01/08/2016  .  Vision loss, left eye 01/08/2016  . PCOS (polycystic ovarian syndrome) 01/15/2012  . IUD (intrauterine device) in place 11/19/2011  . 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 Hashimoto's disease 11/19/2011  . HASHIMOTO'S THYROIDITIS 08/13/2009  . DYSPNEA ON EXERTION 08/13/2009    Past Surgical History:  Procedure Laterality Date  . CESAREAN SECTION N/A 06/01/2019   Procedure: CESAREAN SECTION;  Surgeon: Margaretmary Shaver, MD;  Location: [facility];  Service: Obstetrics;  Laterality: N/A;    OB History     Gravida  2   Para  2   Term  1   Preterm      AB      Living  1      SAB      IAB      Ectopic      Multiple  0   Live Births  1            Home Medications    Prior to Admission medications   Medication Sig Start Date End Date Taking? Authorizing Provider  paragard intrauterine copper IUD IUD 1 each by Intrauterine route once.   Yes [provider]  sulfamethoxazole-trimethoprim (BACTRIM DS) 800-160 MG tablet Take 1 tablet by mouth 2 (two) times daily. 10/18/19  Yes [provider]  UNKNOWN TO PATIENT [employer] brand (equivalent to Monistat)   Yes [provider]  acetaminophen  (TYLENOL ) 325 MG tablet Take 650 mg by mouth every 6 (six) hours as  needed for mild pain or headache.    [provider]  albuterol  (VENTOLIN  HFA) 108 (90 Base) MCG/ACT inhaler Inhale 1-2 puffs into the lungs every 6 (six) hours as needed for wheezing or shortness of breath. 05/01/23   Starlene Eaton, FNP  Diroximel Fumarate (VUMERITY) 231 MG CPDR Take 231 mg by mouth as directed.    [provider]  doxycycline  (VIBRAMYCIN ) 100 MG capsule Take 100 mg by mouth as directed.    [provider]  ergocalciferol  (VITAMIN D2) 1.25 MG (50000 UT) capsule Take 1 capsule by mouth every 7 (seven) days.    [provider]  ibuprofen  (ADVIL ) 600 MG tablet Take 1 tablet (600 mg  total) by mouth every 6 (six) hours as needed for mild pain. 06/27/21   Dodson Freestone, FNP  levothyroxine  (SYNTHROID ) 125 MCG tablet Take 250 mcg by mouth daily before breakfast. Patient not taking: Reported on 05/01/2023    [provider]  metroNIDAZOLE  (FLAGYL ) 500 MG tablet Take 1 tablet (500 mg total) by mouth 2 (two) times daily. Patient not taking: Reported on 05/01/2023 07/02/21   Corine Dice, MD  promethazine -dextromethorphan (PROMETHAZINE -DM) 6.25-15 MG/5ML syrup Take 5 mLs by mouth at bedtime as needed for cough. 05/01/23   Starlene Eaton, FNP  Teriflunomide (AUBAGIO) 14 MG TABS Take by mouth.    [provider]  triamcinolone  cream (KENALOG ) 0.1 % Apply 1 application topically 2 (two) times daily. Patient not taking: Reported on 05/01/2023 12/21/19   Wieters, Hallie C, PA-C  Vitamin D , Ergocalciferol , (DRISDOL ) 1.25 MG (50000 UNIT) CAPS capsule TAKE 1 CAPSULE (50,000 UNITS TOTAL) BY MOUTH EVERY 7 (SEVEN) DAYS. 01/10/20   Sater, Sherida Dimmer, MD    Family History Family History  Problem Relation Age of Onset  . Hypertension Mother   . Diabetes Mellitus II Mother   . Healthy Father   . Healthy Maternal Aunt   . Healthy Maternal Grandmother     Social History Social History   Tobacco Use  . Smoking status: Every Day    Current packs/day: 0.50    Average packs/day: 0.5 packs/day for 8.0 years (4.0 ttl pk-yrs)    Types: Cigarettes  . Smokeless tobacco: Never  Vaping Use  . Vaping status: Never Used  Substance Use Topics  . Alcohol use: Not Currently    Comment: occasionally  . Drug use: No     Allergies   Patient has no known allergies.   Review of Systems Review of Systems  Gastrointestinal:  Positive for abdominal pain.     Physical Exam Triage Vital Signs ED Triage Vitals  Encounter Vitals Group     BP 07/21/23 1509 113/76     Systolic BP Percentile --      Diastolic BP Percentile --      Pulse Rate 07/21/23 1509 73     Resp  07/21/23 1509 18     Temp 07/21/23 1509 98.6 F (37 C)     Temp Source 07/21/23 1509 Oral     SpO2 07/21/23 1509 97 %     Weight 07/21/23 1505 230 lb (104.3 kg)     Height 07/21/23 1505 5\' 7"  (1.702 m)     Head Circumference --      Peak Flow --      Pain Score 07/21/23 1501 0     Pain Loc --      Pain Education --      Exclude from Growth Chart --  No data found.  Updated Vital Signs BP 113/76 (BP Location: Left Arm)   Pulse 73   Temp 98.6 F (37 C) (Oral)   Resp 18   Ht 5\' 7"  (1.702 m)   Wt 230 lb (104.3 kg)   LMP  (LMP Unknown) Comment: "earlier this month i was spotting recently".  SpO2 97%   BMI 36.02 kg/m   Visual Acuity Right Eye Distance:   Left Eye Distance:   Bilateral Distance:    Right Eye Near:   Left Eye Near:    Bilateral Near:     Physical Exam   UC Treatments / Results  Labs (all labs ordered are listed, but only abnormal results are displayed) Labs Reviewed  POCT URINALYSIS DIP (MANUAL ENTRY) - Abnormal; Notable for the following components:      Result Value   Blood, UA small (*)    All other components within normal limits  POCT URINE PREGNANCY - Normal    EKG   Radiology No results found.  Procedures Procedures (including critical care time)  Medications Ordered in UC Medications - No data to display  Initial Impression / Assessment and Plan / UC Course  I have reviewed the triage vital signs and the nursing notes.  Pertinent labs & imaging results that were available during my care of the patient were reviewed by me and considered in my medical decision making (see chart for details).     *** Final Clinical Impressions(s) / UC Diagnoses   Final diagnoses:  None   Discharge Instructions   None    ED Prescriptions   None    PDMP not reviewed this encounter.

## 2023-07-21 NOTE — Discharge Instructions (Signed)
 Your testing will be available within 3 to 4 days.  I am treating you empirically for possible yeast infection take 1 Diflucan  today repeat in 3 days if symptoms have not resolved.  If symptoms resolve just hold onto the second dose.  We will reach out to you if any additional treatment is needed.

## 2023-07-22 LAB — CERVICOVAGINAL ANCILLARY ONLY
Bacterial Vaginitis (gardnerella): NEGATIVE
Candida Glabrata: NEGATIVE
Candida Vaginitis: NEGATIVE
Comment: NEGATIVE
Comment: NEGATIVE
Comment: NEGATIVE

## 2023-09-06 ENCOUNTER — Ambulatory Visit
Admission: EM | Admit: 2023-09-06 | Discharge: 2023-09-06 | Disposition: A | Discharge status: Home / Self Care | Attending: Internal Medicine | Admitting: Internal Medicine

## 2023-09-06 ENCOUNTER — Encounter: Payer: Self-pay | Admitting: Emergency Medicine

## 2023-09-06 DIAGNOSIS — K591 Functional diarrhea: Secondary | ICD-10-CM | POA: Diagnosis not present

## 2023-09-06 DIAGNOSIS — R110 Nausea: Secondary | ICD-10-CM

## 2023-09-06 MED ORDER — ONDANSETRON 4 MG PO TBDP: 4.0000 mg | ORAL_TABLET | Freq: Three times a day (TID) | ORAL | 0 refills | Status: AC | PRN

## 2023-09-06 NOTE — Discharge Instructions (Addendum)
 Your symptoms are likely due to recent intake of dairy (cheese). Please limit your intake of dairy is much as possible as I believe that you may be lactose intolerant. Please follow-up with your primary care provider for ongoing evaluation of this and for further testing.  In the meantime, we will manage your symptoms with as needed use of Zofran  and Imodium . You may use over-the-counter Imodium  as needed and pick up Zofran  from the pharmacy. Zofran  is every 8 hours as needed for nausea and vomiting.  Please drink plenty of fluids to stay well-hydrated including Pedialyte.  Eat bananas, rice, toast, applesauce, and other bland foods until your diarrhea improves.  If you become dizzy, pass out, have worsening/severe abdominal pain, or if you are unable to keep any fluids down without vomiting or having diarrhea in the next 24 hours, please proceed to the nearest emergency room for further evaluation. Otherwise, please follow-up with your primary care as discussed. I hope you feel better!

## 2023-09-06 NOTE — ED Triage Notes (Signed)
 Pt present with lower abdominal pain and diarrhea that started last week Sunday. Pt states she was fine all week, but the symptoms returned last night.

## 2023-09-06 NOTE — ED Provider Notes (Signed)
 [name]    CSN: [id number] Arrival date & time: 09/06/23  8295      History   Chief Complaint Chief Complaint  Patient presents with   Abdominal Pain   Diarrhea    HPI Taylor Donovan is a 33 y.o. female.   Taylor Donovan is a 33 y.o. female presenting for chief complaint of Abdominal Pain and Diarrhea. She had an episode of diarrhea 7 days ago with lower abdominal pain, symptoms self resolved and then returned again yesterday abruptly (approximately 20-30 minutes) after she ate a [place] cheese steak sandwich with minimal cheese. She is also nauseous and has not vomited.  She has had multiple episodes of diarrhea overnight last night with associated bilateral lower abdominal cramping each time.  Reports chills without known fever at home.  No blood in output.  Denies recent sick contacts with similar symptoms.  She reports history of chronic intestinal problems and has history of bowel patterns fluctuating between constipation and diarrhea at baseline.  She believes she may be lactose intolerant as she frequently has diarrhea and stomach upset after eating dairy but she has never been formally worked up for this or if diagnosed with this by a gastroenterologist or a primary care provider.  She has been able to tolerate bland foods this morning without vomiting.  Denies dizziness, syncope, viral URI symptoms.  History of cesarean section, otherwise no history of abdominal surgeries.  LMP 08/12/2023, she has an IUD.  Denies chance of pregnancy.  She has not attempted use of any over-the-counter medications to help with symptoms PTA.   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   RhD negative 07/21/2023   High risk medication use 07/07/2019   Vitamin D  deficiency 07/07/2019   S/P cesarean section 06/01/2019   Normal pregnancy in multigravida in third trimester 05/31/2019   Low-lying placenta 01/03/2019   Pregnancy 12/21/2018   History of depression 11/02/2018   Tobacco use in pregnancy 11/02/2018   Hypothyroidism in pregnancy 10/11/2018   Right shoulder pain 12/16/2017   Multiple joint pain 08/19/2017   Other fatigue 06/10/2017   Depression 06/10/2017   Bilateral hip bursitis 12/10/2016   BMI 35.0-35.9,adult 12/10/2016   Insomnia 06/10/2016   Urinary urgency 06/10/2016   Ataxic gait 01/29/2016   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   Multiple sclerosis (HCC) 01/16/2016   B12 deficiency 01/16/2016   Vision loss of left eye 01/08/2016   Abnormal brain MRI 01/08/2016   Hypothyroidism 01/08/2016   Vision loss, left eye 01/08/2016   PCOS (polycystic ovarian syndrome) 01/15/2012   IUD (intrauterine device) in place 11/19/2011   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 Authorizing Provider  ondansetron  (ZOFRAN -ODT) 4 MG disintegrating tablet Take 1 tablet (4 mg total) by mouth every 8 (eight) hours as needed for nausea or vomiting. 09/06/23  Yes Taylor Eaton, FNP  paragard intrauterine copper IUD IUD 1 each by Intrauterine route once.   Yes [provider]  acetaminophen  (TYLENOL ) 325 MG tablet Take 650 mg by mouth every 6 (six) hours as needed for mild pain or headache.    [provider]  albuterol  (VENTOLIN  HFA) 108 (90 Base) MCG/ACT inhaler Inhale 1-2 puffs into the lungs every 6 (six) hours as needed for wheezing or shortness of breath. 05/01/23   Taylor Eaton, FNP  Diroximel Fumarate (VUMERITY) 231 MG CPDR Take 231 mg by mouth as directed.    [provider]  doxycycline  (VIBRAMYCIN ) 100 MG capsule Take 100 mg by mouth as directed.    [provider]  ergocalciferol  (VITAMIN D2) 1.25 MG (50000 UT) capsule Take 1 capsule by mouth every 7 (seven) days.    [provider]  fluconazole  (DIFLUCAN ) 150 MG tablet Take 1 tablet (150 mg total) by mouth every three (3) days as needed (vaginal itching). Repeat if needed 07/21/23   Taylor Carmine, NP  ibuprofen  (ADVIL ) 600 MG tablet Take 1 tablet (600 mg total) by mouth every 6 (six) hours as needed for mild pain. 06/27/21   Taylor Freestone, FNP  levothyroxine  (SYNTHROID ) 125 MCG tablet Take 250 mcg by mouth daily before breakfast. Patient not taking: Reported on 05/01/2023    [provider]  metroNIDAZOLE  (FLAGYL ) 500 MG tablet Take 1 tablet (500 mg total) by mouth 2 (two) times daily. Patient not taking: Reported on 05/01/2023 07/02/21   Taylor Dice, MD  promethazine -dextromethorphan (PROMETHAZINE -DM) 6.25-15 MG/5ML syrup Take 5 mLs by mouth at bedtime as needed for cough. 05/01/23   Taylor Abboud M, FNP  sulfamethoxazole-trimethoprim (BACTRIM DS) 800-160 MG tablet Take 1 tablet by mouth 2 (two)  times daily. 10/18/19   [provider]  Teriflunomide (AUBAGIO) 14 MG TABS Take by mouth.    [provider]  triamcinolone  cream (KENALOG ) 0.1 % Apply 1 application topically 2 (two) times daily. Patient not taking: Reported on 05/01/2023 12/21/19   Wieters, Hallie C, PA-Donovan  UNKNOWN TO PATIENT [employer] brand (equivalent to Monistat)    [provider]  Vitamin D , Ergocalciferol , (DRISDOL ) 1.25 MG (50000 UNIT) CAPS capsule TAKE 1 CAPSULE (50,000 UNITS TOTAL) BY MOUTH EVERY 7 (SEVEN) DAYS. 01/10/20   Sater, Sherida Dimmer, MD    Family History Family History  Problem Relation Age of Onset   Hypertension Mother    Diabetes Mellitus II Mother    Healthy Father    Healthy Maternal Aunt    Healthy Maternal Grandmother  Social History Social History   Tobacco Use   Smoking status: Every Day    Current packs/day: 0.50    Average packs/day: 0.5 packs/day for 8.0 years (4.0 ttl pk-yrs)    Types: Cigarettes   Smokeless tobacco: Never  Vaping Use   Vaping status: Never Used  Substance Use Topics   Alcohol use: Not Currently    Comment: occasionally   Drug use: No     Allergies   Patient has no known allergies.   Review of Systems Review of Systems  Gastrointestinal:  Positive for abdominal pain and diarrhea.  Per HPI  Physical Exam Triage Vital Signs ED Triage Vitals  Encounter Vitals Group     BP 09/06/23 0936 (!) 137/92     Systolic BP Percentile --      Diastolic BP Percentile --      Pulse Rate 09/06/23 0936 (!) 58     Resp 09/06/23 0936 16     Temp 09/06/23 0936 98.3 F (36.8 Donovan)     Temp Source 09/06/23 0936 Oral     SpO2 09/06/23 0936 96 %     Weight --      Height --      Head Circumference --      Peak Flow --      Pain Score 09/06/23 0940 7     Pain Loc --      Pain Education --      Exclude from Growth Chart --    No data found.  Updated Vital Signs BP (!) 137/92 (BP Location: Right Arm)   Pulse (!) 58   Temp 98.3  F (36.8 Donovan) (Oral)   Resp 16   LMP 08/12/2023   SpO2 96%   Visual Acuity Right Eye Distance:   Left Eye Distance:   Bilateral Distance:    Right Eye Near:   Left Eye Near:    Bilateral Near:     Physical Exam Vitals and nursing note reviewed.  Constitutional:      Appearance: She is not ill-appearing or toxic-appearing.  HENT:     Head: Normocephalic and atraumatic.     Right Ear: Hearing and external ear normal.     Left Ear: Hearing and external ear normal.     Nose: Nose normal.     Mouth/Throat:     Lips: Pink.  Eyes:     General: Lids are normal. Vision grossly intact. Gaze aligned appropriately.     Extraocular Movements: Extraocular movements intact.     Conjunctiva/sclera: Conjunctivae normal.  Cardiovascular:     Rate and Rhythm: Normal rate and regular rhythm.     Heart sounds: Normal heart sounds, S1 normal and S2 normal.  Pulmonary:     Effort: Pulmonary effort is normal. No respiratory distress.     Breath sounds: Normal breath sounds and air entry.  Abdominal:     General: Abdomen is flat. Bowel sounds are normal.     Palpations: Abdomen is soft.     Tenderness: There is abdominal tenderness in the right lower quadrant, suprapubic area and left lower quadrant. There is no right CVA tenderness, left CVA tenderness, guarding or rebound. Negative signs include Murphy's sign and McBurney's sign.     Comments: No peritoneal signs to abdominal exam.  Minimally tender to the bilateral lower quadrants of the abdomen.  Musculoskeletal:     Cervical back: Neck supple.  Skin:    General: Skin is warm and dry.  Capillary Refill: Capillary refill takes less than 2 seconds.     Findings: No rash.  Neurological:     General: No focal deficit present.     Mental Status: She is alert and oriented to person, place, and time. Mental status is at baseline.     Cranial Nerves: No dysarthria or facial asymmetry.  Psychiatric:        Mood and Affect: Mood normal.         Speech: Speech normal.        Behavior: Behavior normal.        Thought Content: Thought content normal.        Judgment: Judgment normal.      UC Treatments / Results  Labs (all labs ordered are listed, but only abnormal results are displayed) Labs Reviewed - No data to display  EKG   Radiology No results found.  Procedures Procedures (including critical care time)  Medications Ordered in UC Medications - No data to display  Initial Impression / Assessment and Plan / UC Course  I have reviewed the triage vital signs and the nursing notes.  Pertinent labs & imaging results that were available during my care of the patient were reviewed by me and considered in my medical decision making (see chart for details).   1.  Diarrhea, nausea without vomiting High clinical suspicion for lactose intolerance given symptom onset 20 to 30 minutes after intake of dairy (cheese on [place] cheese steak). Advised to avoid dairy until further workup is performed by PCP who may refer patient to a gastroenterologist for further workup and evaluation.  No red flags on exam. Symptomatic management with Zofran  as needed and Imodium  as needed recommended today. She appears well-hydrated and is tolerating p.o.'s, therefore deferred IV rehydration. No peritoneal signs on abdominal exam.  Crampy lower abdominal pain secondary to diarrhea illness.  Recommend follow-up with PCP as stated above and strict ER return precautions discussed.  Counseled patient on potential for adverse effects with medications prescribed/recommended today, strict ER and return-to-clinic precautions discussed, patient verbalized understanding.    Final Clinical Impressions(s) / UC Diagnoses   Final diagnoses:  Diarrhea, functional  Nausea without vomiting     Discharge Instructions      Your symptoms are likely due to recent intake of dairy (cheese). Please limit your intake of dairy is much as possible as I  believe that you may be lactose intolerant. Please follow-up with your primary care provider for ongoing evaluation of this and for further testing.  In the meantime, we will manage your symptoms with as needed use of Zofran  and Imodium . You may use over-the-counter Imodium  as needed and pick up Zofran  from the pharmacy. Zofran  is every 8 hours as needed for nausea and vomiting.  Please drink plenty of fluids to stay well-hydrated including Pedialyte.  Eat bananas, rice, toast, applesauce, and other bland foods until your diarrhea improves.  If you become dizzy, pass out, have worsening/severe abdominal pain, or if you are unable to keep any fluids down without vomiting or having diarrhea in the next 24 hours, please proceed to the nearest emergency room for further evaluation. Otherwise, please follow-up with your primary care as discussed. I hope you feel better!   ED Prescriptions     Medication Sig Dispense Auth. Provider   ondansetron  (ZOFRAN -ODT) 4 MG disintegrating tablet Take 1 tablet (4 mg total) by mouth every 8 (eight) hours as needed for nausea or vomiting. 20 tablet Modestine Scherzinger M, FNP  PDMP not reviewed this encounter.   Taylor Donovan, [place] 09/06/23 1006
